# Patient Record
Sex: Male | Born: 1970
Health system: Southern US, Community
[De-identification: ages and names within clinical notes are randomized; demographics above are authoritative.]

## PROBLEM LIST (undated history)

## (undated) DIAGNOSIS — I499 Cardiac arrhythmia, unspecified: Secondary | ICD-10-CM

## (undated) DIAGNOSIS — M3320 Polymyositis, organ involvement unspecified: Secondary | ICD-10-CM

## (undated) DIAGNOSIS — I82409 Acute embolism and thrombosis of unspecified deep veins of unspecified lower extremity: Secondary | ICD-10-CM

## (undated) DIAGNOSIS — R9431 Abnormal electrocardiogram [ECG] [EKG]: Secondary | ICD-10-CM

## (undated) DIAGNOSIS — G7249 Other inflammatory and immune myopathies, not elsewhere classified: Secondary | ICD-10-CM

## (undated) DIAGNOSIS — I2699 Other pulmonary embolism without acute cor pulmonale: Secondary | ICD-10-CM

## (undated) DIAGNOSIS — S82892A Other fracture of left lower leg, initial encounter for closed fracture: Secondary | ICD-10-CM

## (undated) DIAGNOSIS — I739 Peripheral vascular disease, unspecified: Secondary | ICD-10-CM

## (undated) HISTORY — DX: Acute embolism and thrombosis of unspecified deep veins of unspecified lower extremity: I82.409

## (undated) HISTORY — DX: Polymyositis, organ involvement unspecified: M33.20

## (undated) HISTORY — DX: Other inflammatory and immune myopathies, not elsewhere classified: G72.49

## (undated) HISTORY — DX: Abnormal electrocardiogram (ECG) (EKG): R94.31

## (undated) HISTORY — PX: COLONOSCOPY: SHX174

## (undated) HISTORY — PX: NO PAST SURGERIES: SHX2092

## (undated) HISTORY — PX: OTHER SURGICAL HISTORY: SHX169

---

## 2010-04-21 ENCOUNTER — Emergency Department (HOSPITAL_COMMUNITY): Admission: EM | Admit: 2010-04-21 | Discharge: 2010-04-21 | Payer: Self-pay | Admitting: Emergency Medicine

## 2011-10-09 DIAGNOSIS — I739 Peripheral vascular disease, unspecified: Secondary | ICD-10-CM

## 2011-10-09 HISTORY — DX: Peripheral vascular disease, unspecified: I73.9

## 2012-03-19 ENCOUNTER — Encounter (HOSPITAL_COMMUNITY): Payer: Self-pay | Admitting: Emergency Medicine

## 2012-03-19 ENCOUNTER — Emergency Department (HOSPITAL_COMMUNITY)
Admission: EM | Admit: 2012-03-19 | Discharge: 2012-03-20 | Disposition: A | Discharge status: Home / Self Care | Payer: Self-pay | Attending: Emergency Medicine | Admitting: Emergency Medicine

## 2012-03-19 DIAGNOSIS — Y92009 Unspecified place in unspecified non-institutional (private) residence as the place of occurrence of the external cause: Secondary | ICD-10-CM | POA: Insufficient documentation

## 2012-03-19 DIAGNOSIS — M25579 Pain in unspecified ankle and joints of unspecified foot: Secondary | ICD-10-CM | POA: Insufficient documentation

## 2012-03-19 DIAGNOSIS — S93402A Sprain of unspecified ligament of left ankle, initial encounter: Secondary | ICD-10-CM

## 2012-03-19 DIAGNOSIS — W1809XA Striking against other object with subsequent fall, initial encounter: Secondary | ICD-10-CM | POA: Insufficient documentation

## 2012-03-19 DIAGNOSIS — S93409A Sprain of unspecified ligament of unspecified ankle, initial encounter: Secondary | ICD-10-CM | POA: Insufficient documentation

## 2012-03-19 NOTE — ED Notes (Addendum)
C/o L ankle pain since stepping off porch into hole around 11:30pm tonight.  C/o nausea and diarrhea since ankle injury.  Pt needed to go to restroom on arrival and then x-ray took him.  Will get vitals when he returns.

## 2012-03-20 ENCOUNTER — Emergency Department (HOSPITAL_COMMUNITY): Payer: Self-pay

## 2012-03-20 IMAGING — CR DG ANKLE COMPLETE 3+V*L*
3 series · 3 of 3 positions shown · non-contrast
Comparison: None.

CLINICAL DATA: Left ankle pain status post twisting injury.

LEFT ANKLE COMPLETE - 3+ VIEW

[x ankle ap left]
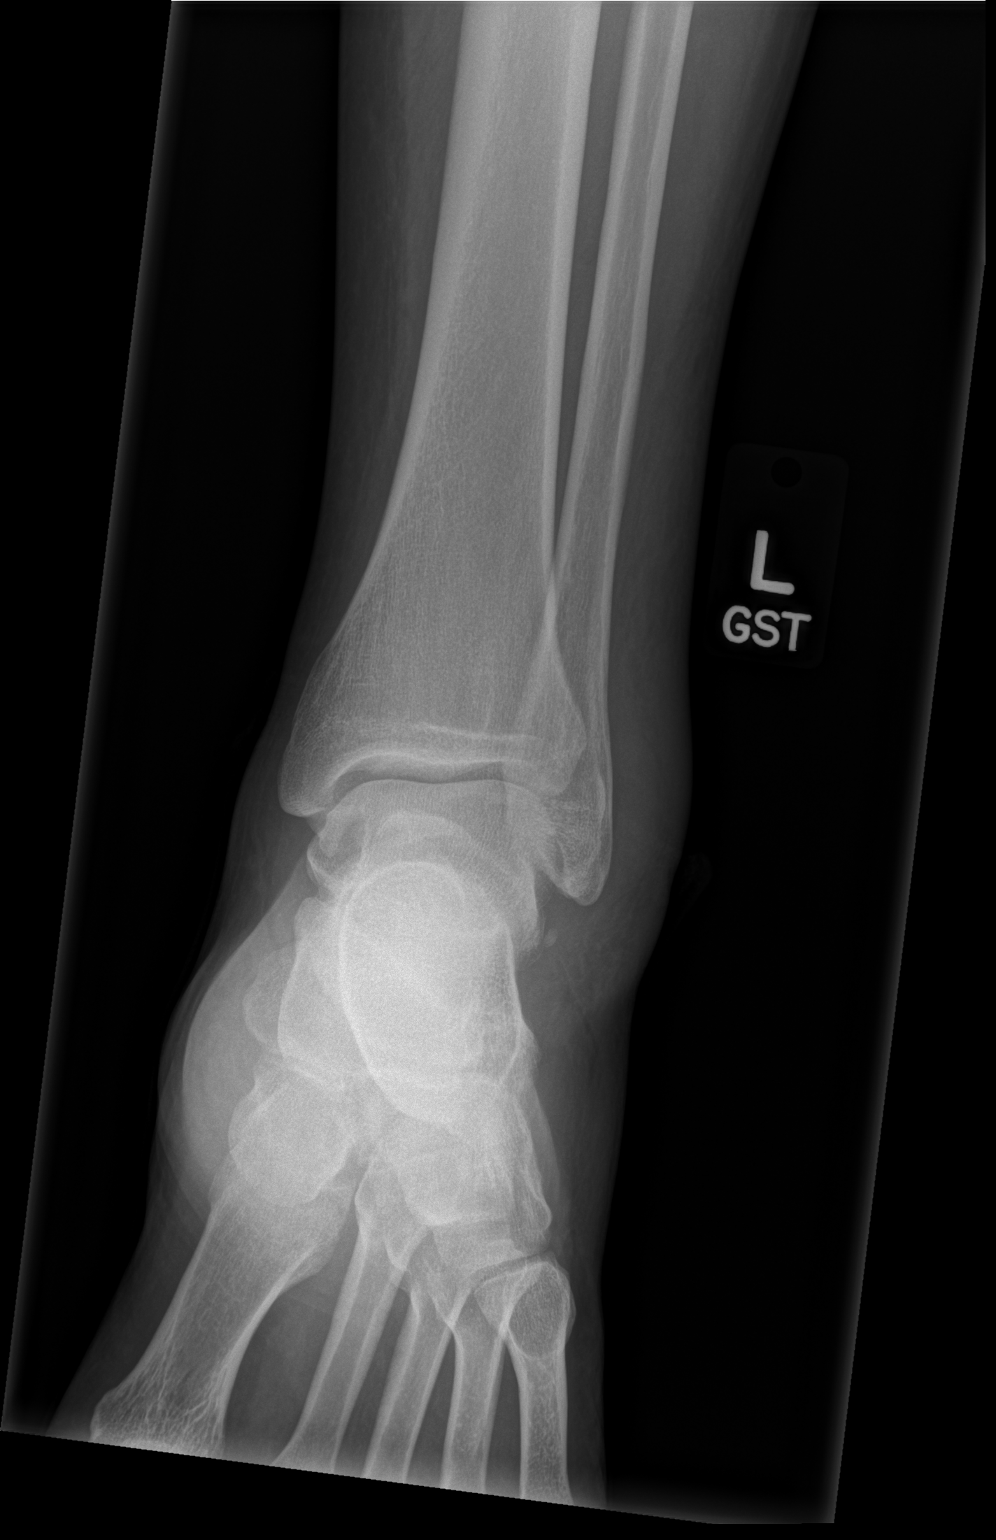

[x ankle obl left]
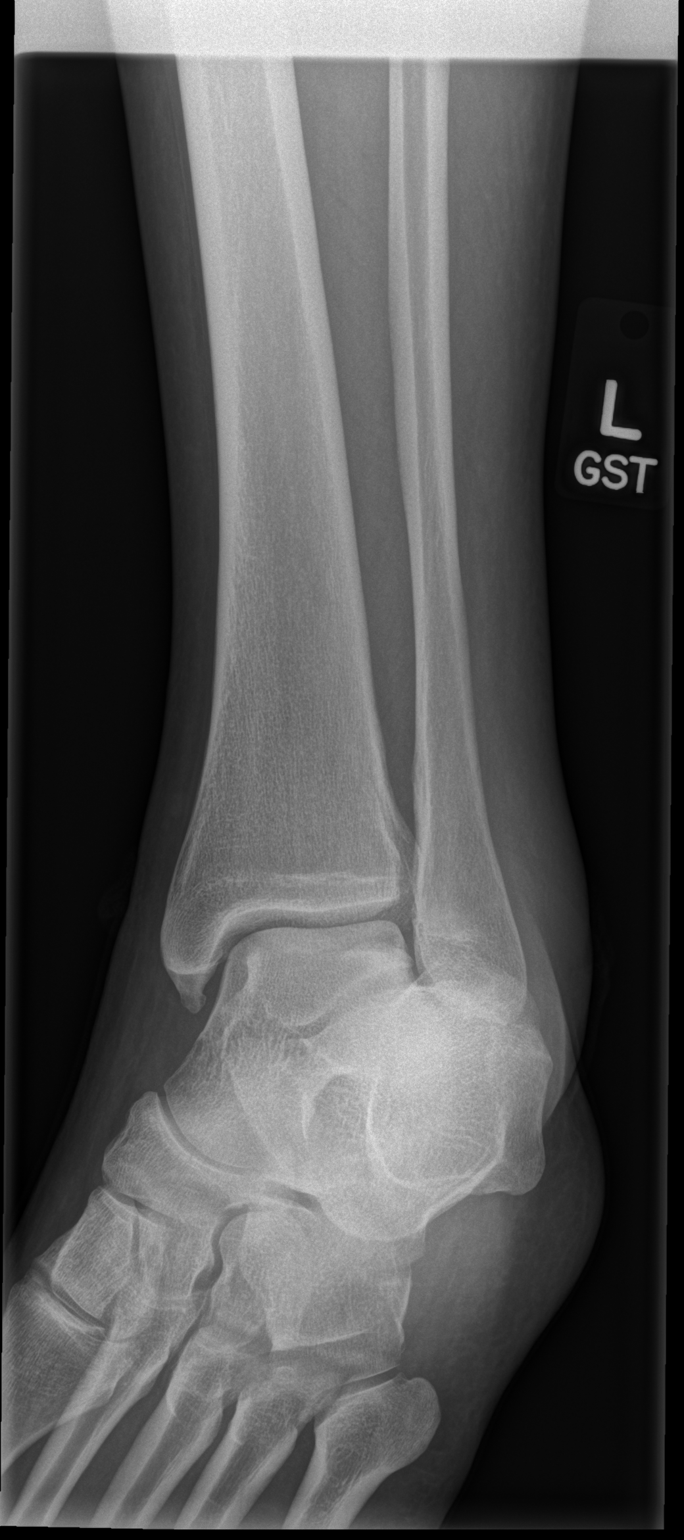

[x ankle lat left]
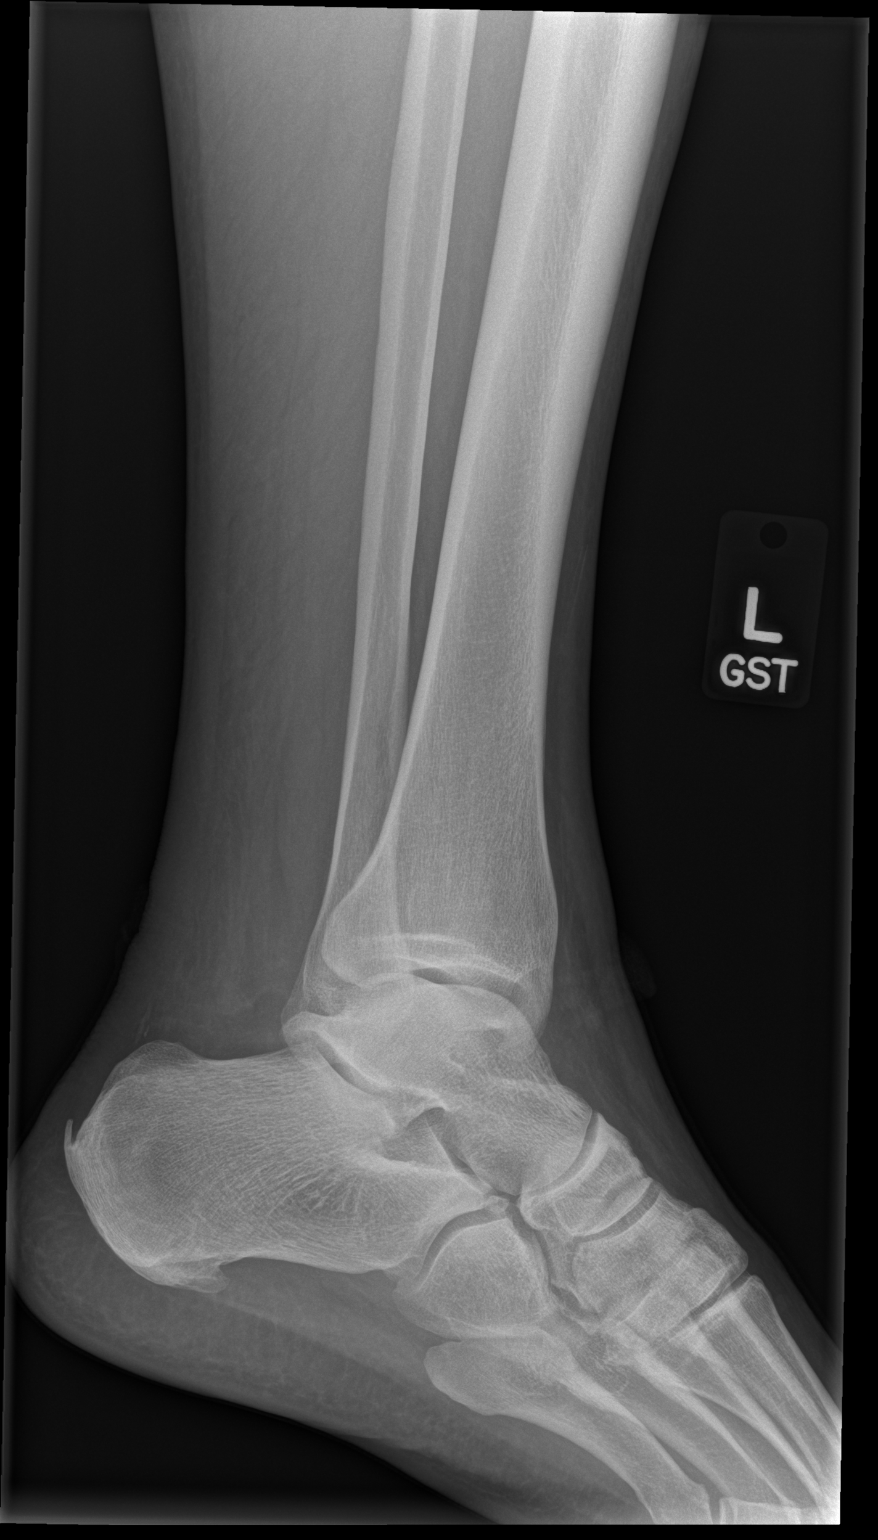

[3 of 3 positions shown; findings below may reference images not displayed]

FINDINGS: Soft tissue swelling overlies the lateral malleolus.
Degenerative changes about the medial malleolus.  Plantar and
posterior calcaneal enthesopathic changes.  No acute fracture or
dislocation identified.  Ankle mortise intact.
IMPRESSION: Lateral soft tissue swelling without acute osseous abnormality
identified. If clinical concern for a fracture persists, recommend
a repeat radiograph in 5-10 days to evaluate for interval change or
callus formation.

## 2012-03-20 MED ORDER — HYDROCODONE-ACETAMINOPHEN 5-325 MG PO TABS
1.0000 | ORAL_TABLET | Freq: Once | ORAL | Status: AC
  Administered 2012-03-20: 1 via ORAL
  Filled 2012-03-20: qty 1

## 2012-03-20 MED ORDER — HYDROCODONE-ACETAMINOPHEN 5-325 MG PO TABS: 1.0000 | ORAL_TABLET | ORAL | Status: AC | PRN

## 2012-03-20 NOTE — Discharge Instructions (Signed)
Ankle Sprain An ankle sprain is an injury to the strong, fibrous tissues (ligaments) that hold the bones of your ankle joint together.  CAUSES Ankle sprain usually is caused by a fall or by twisting your ankle. People who participate in sports are more prone to these types of injuries.  SYMPTOMS  Symptoms of ankle sprain include:  Pain in your ankle. The pain may be present at rest or only when you are trying to stand or walk.   Swelling.   Bruising. Bruising may develop immediately or within 1 to 2 days after your injury.   Difficulty standing or walking.  DIAGNOSIS  Your caregiver will ask you details about your injury and perform a physical exam of your ankle to determine if you have an ankle sprain. During the physical exam, your caregiver will press and squeeze specific areas of your foot and ankle. Your caregiver will try to move your ankle in certain ways. An X-ray exam may be done to be sure a bone was not broken or a ligament did not separate from one of the bones in your ankle (avulsion).  TREATMENT  Certain types of braces can help stabilize your ankle. Your caregiver can make a recommendation for this. Your caregiver may recommend the use of medication for pain. If your sprain is severe, your caregiver may refer you to a surgeon who helps to restore function to parts of your skeletal system (orthopedist) or a physical therapist. HOME CARE INSTRUCTIONS  Apply ice to your injury for 1 to 2 days or as directed by your caregiver. Applying ice helps to reduce inflammation and pain.  Put ice in a plastic bag.   Place a towel between your skin and the bag.   Leave the ice on for 15 to 20 minutes at a time, every 2 hours while you are awake.   Take over-the-counter or prescription medicines for pain, discomfort, or fever only as directed by your caregiver.   Keep your injured leg elevated, when possible, to lessen swelling.   If your caregiver recommends crutches, use them as  instructed. Gradually, put weight on the affected ankle. Continue to use crutches or a cane until you can walk without feeling pain in your ankle.   If you have a plaster splint, wear the splint as directed by your caregiver. Do not rest it on anything harder than a pillow the first 24 hours. Do not put weight on it. Do not get it wet. You may take it off to take a shower or bath.   You may have been given an elastic bandage to wear around your ankle to provide support. If the elastic bandage is too tight (you have numbness or tingling in your foot or your foot becomes cold and blue), adjust the bandage to make it comfortable.   If you have an air splint, you may blow more air into it or let air out to make it more comfortable. You may take your splint off at night and before taking a shower or bath.   Wiggle your toes in the splint several times per day if you are able.  SEEK MEDICAL CARE IF:   You have an increase in bruising, swelling, or pain.   Your toes feel cold.   Pain relief is not achieved with medication.  SEEK IMMEDIATE MEDICAL CARE IF: Your toes are numb or blue or you have severe pain. MAKE SURE YOU:   Understand these instructions.   Will watch your condition.     Will get help right away if you are not doing well or get worse.  Document Released: 09/24/2005 Document Revised: 09/13/2011 Document Reviewed: 04/28/2008 [facility] Patient Information 2012 [place], [place].Sprains Sprains are painful injuries to joints as a result of partial or complete tearing of ligaments. HOME CARE INSTRUCTIONS   For the first 24 hours, keep the injured limb raised on 2 pillows while lying down.   Apply ice bags about every 2 hours for 20 to 30 minutes, while awake, to the injured area for the first 24 hours. Then apply as directed by your caregiver. Place the ice in a plastic bag with a towel around it to prevent frostbite to the skin.   Only take over-the-counter or prescription medicines  for pain, discomfort, or fever as directed by your caregiver.   If an ace bandage (a stretchy, elastic wrapping bandage) has been applied today, remove and reapply every 3 to 4 hours. Apply firm enough to keep swelling down. Donot apply tightly. Watch fingers or toes for swelling, bluish discoloration, coldness, numbness, or excessive pain. If any of these problems (symptoms) occur, remove the ace bandage and reapply it more loosely. Contact your caregiver or return to this location if these symptoms persist.  Persistent pain and inability to use the injured area for more than 2 to 3 days are warning signs. See a caregiver for a follow-up visit as soon as possible. A hairline fracture (broken bone) may not show on X-rays. Persistent pain and swelling indicate that further evaluation, use of crutches, and/or more X-rays are needed. X-rays may sometimes not show a small fracture until a week or ten days later. Make a follow-up appointment with your own caregiver or to whom we have referred you. A specialist in reading X-rays(radiologist) will re-read your X-rays. Make sure you know how to obtain your X-ray results. Do not assume everything is normal if you do not hear from [place]. SEEK IMMEDIATE MEDICAL CARE IF:  You develop severe pain or more swelling.   The pain is not controlled with medicine.   Your skin or nails below the injury turn blue or grey or feel cold or numb.  Document Released: 09/21/2000 Document Revised: 09/13/2011 Document Reviewed: 05/10/2008 [facility] Patient Information 2012 [place], [place].

## 2012-03-20 NOTE — ED Provider Notes (Signed)
Medical screening examination/treatment/procedure(s) were performed by non-physician practitioner and as supervising physician I was immediately available for consultation/collaboration.  Jasmine Awe, MD 03/20/12 0145

## 2012-03-20 NOTE — ED Notes (Signed)
Pt. To x-ray .

## 2012-03-20 NOTE — ED Provider Notes (Signed)
History     CSN: [id number]  Arrival date & time 03/19/12  2347   First MD Initiated Contact with Patient 03/20/12 0010      Chief Complaint  Patient presents with  . Ankle Pain    (Consider location/radiation/quality/duration/timing/severity/associated sxs/prior treatment) HPI Comments: Patient here with left ankle pain after stepping off a step and into a hole - he states that he inverted his ankle in this - reports continued swelling and pain, is able to ambulate, denies numbness, tingling, instability in the ankle.  Patient is a 41 y.o. male presenting with ankle pain. The history is provided by the patient. No language interpreter was used.  Ankle Pain  The incident occurred 3 to 5 hours ago. The incident occurred at home. The injury mechanism was a fall. The pain is present in the left ankle. The quality of the pain is described as throbbing. The pain is at a severity of 8/10. The pain is moderate. The pain has been constant since onset. Pertinent negatives include no numbness, no inability to bear weight, no loss of motion, no muscle weakness, no loss of sensation and no tingling. He reports no foreign bodies present. The symptoms are aggravated by activity and bearing weight. He has tried nothing for the symptoms. The treatment provided no relief.    History reviewed. No pertinent past medical history.  History reviewed. No pertinent past surgical history.  No family history on file.  History  Substance Use Topics  . Smoking status: Never Smoker   . Smokeless tobacco: Not on file  . Alcohol Use: No      Review of Systems  Constitutional: Negative for fever and chills.  HENT: Negative for neck pain.   Eyes: Negative for pain.  Respiratory: Negative for chest tightness and shortness of breath.   Cardiovascular: Negative for chest pain.  Genitourinary: Negative for flank pain.  Musculoskeletal: Positive for joint swelling and arthralgias.  Neurological: Negative for  tingling and numbness.  All other systems reviewed and are negative.    Allergies  Review of patient's allergies indicates no known allergies.  Home Medications  No current outpatient prescriptions on file.  BP 113/74  Pulse 96  Temp 98.1 F (36.7 C) (Oral)  Resp 16  SpO2 95%  Physical Exam  Nursing note and vitals reviewed. Constitutional: He is oriented to person, place, and time. He appears well-developed and well-nourished. No distress.  HENT:  Head: Normocephalic and atraumatic.  Right Ear: External ear normal.  Left Ear: External ear normal.  Nose: Nose normal.  Mouth/Throat: Oropharynx is clear and moist. No oropharyngeal exudate.  Eyes: Conjunctivae are normal. Pupils are equal, round, and reactive to light. No scleral icterus.  Neck: Normal range of motion. Neck supple.  Cardiovascular: Normal rate, regular rhythm and normal heart sounds.  Exam reveals no gallop and no friction rub.   No murmur heard. Pulmonary/Chest: Effort normal and breath sounds normal. No respiratory distress. He has no wheezes. He has no rales. He exhibits no tenderness.  Abdominal: Soft. Bowel sounds are normal. He exhibits no distension. There is no tenderness.  Musculoskeletal:       Left ankle: He exhibits decreased range of motion and swelling. He exhibits no ecchymosis, no deformity and normal pulse. tenderness. Lateral malleolus tenderness found. Achilles tendon normal. Achilles tendon exhibits no pain and no defect.  Lymphadenopathy:    He has no cervical adenopathy.  Neurological: He is alert and oriented to person, place, and time. No cranial nerve  deficit. He exhibits normal muscle tone. Coordination normal.  Skin: Skin is warm and dry. No rash noted. No erythema. No pallor.  Psychiatric: He has a normal mood and affect. His behavior is normal. Judgment and thought content normal.    ED Course  Procedures (including critical care time)  Labs Reviewed - No data to display Dg  Ankle Complete Left  03/20/2012  *RADIOLOGY REPORT*  Clinical Data: Left ankle pain status post twisting injury.  LEFT ANKLE COMPLETE - 3+ VIEW  Comparison: None.  Findings: Soft tissue swelling overlies the lateral malleolus. Degenerative changes about the medial malleolus.  Plantar and posterior calcaneal enthesopathic changes.  No acute fracture or dislocation identified.  Ankle mortise intact.  IMPRESSION: Lateral soft tissue swelling without acute osseous abnormality identified. If clinical concern for a fracture persists, recommend a repeat radiograph in 5-10 days to evaluate for interval change or callus formation.  Original Report Authenticated By: Waneta Martins, M.D.     Left ankle sprain   MDM  Patient here with pain and swelling to lateral malleolus after left foot inversion.  Likely sprain, will place in ankle ASO and crutches and he will follow up with ortho in 1 week if continues with pain and swelling.       Izola Price [place], [place] 03/20/12 0144

## 2012-03-20 NOTE — ED Notes (Signed)
Pt. Discharged to home via w/c, pt. Alert and oriented, NAD noted 

## 2012-05-08 DIAGNOSIS — I2699 Other pulmonary embolism without acute cor pulmonale: Secondary | ICD-10-CM

## 2012-05-08 HISTORY — DX: Other pulmonary embolism without acute cor pulmonale: I26.99

## 2012-05-29 DIAGNOSIS — I2699 Other pulmonary embolism without acute cor pulmonale: Secondary | ICD-10-CM

## 2012-05-30 DIAGNOSIS — R0602 Shortness of breath: Secondary | ICD-10-CM

## 2013-02-13 ENCOUNTER — Emergency Department (HOSPITAL_COMMUNITY): Payer: Self-pay

## 2013-02-13 ENCOUNTER — Encounter (HOSPITAL_COMMUNITY): Payer: Self-pay | Admitting: Emergency Medicine

## 2013-02-13 ENCOUNTER — Emergency Department (HOSPITAL_COMMUNITY)
Admission: EM | Admit: 2013-02-13 | Discharge: 2013-02-13 | Payer: Self-pay | Attending: Emergency Medicine | Admitting: Emergency Medicine

## 2013-02-13 DIAGNOSIS — Y9389 Activity, other specified: Secondary | ICD-10-CM | POA: Insufficient documentation

## 2013-02-13 DIAGNOSIS — S93609A Unspecified sprain of unspecified foot, initial encounter: Secondary | ICD-10-CM | POA: Insufficient documentation

## 2013-02-13 DIAGNOSIS — S93601A Unspecified sprain of right foot, initial encounter: Secondary | ICD-10-CM

## 2013-02-13 DIAGNOSIS — W19XXXA Unspecified fall, initial encounter: Secondary | ICD-10-CM | POA: Insufficient documentation

## 2013-02-13 DIAGNOSIS — S92002A Unspecified fracture of left calcaneus, initial encounter for closed fracture: Secondary | ICD-10-CM

## 2013-02-13 DIAGNOSIS — Y9229 Other specified public building as the place of occurrence of the external cause: Secondary | ICD-10-CM | POA: Insufficient documentation

## 2013-02-13 DIAGNOSIS — Z8739 Personal history of other diseases of the musculoskeletal system and connective tissue: Secondary | ICD-10-CM | POA: Insufficient documentation

## 2013-02-13 DIAGNOSIS — S92009A Unspecified fracture of unspecified calcaneus, initial encounter for closed fracture: Secondary | ICD-10-CM | POA: Insufficient documentation

## 2013-02-13 DIAGNOSIS — Z8709 Personal history of other diseases of the respiratory system: Secondary | ICD-10-CM | POA: Insufficient documentation

## 2013-02-13 HISTORY — DX: Other pulmonary embolism without acute cor pulmonale: I26.99

## 2013-02-13 HISTORY — DX: Other fracture of left lower leg, initial encounter for closed fracture: S82.892A

## 2013-02-13 IMAGING — CR DG FOOT COMPLETE 3+V*R*
3 series · 3 of 3 positions shown · non-contrast
Comparison: Concurrently obtained radiographs of the contralateral
foot

CLINICAL DATA: Fall, pain

RIGHT FOOT COMPLETE - 3+ VIEW

[x foot ap right]
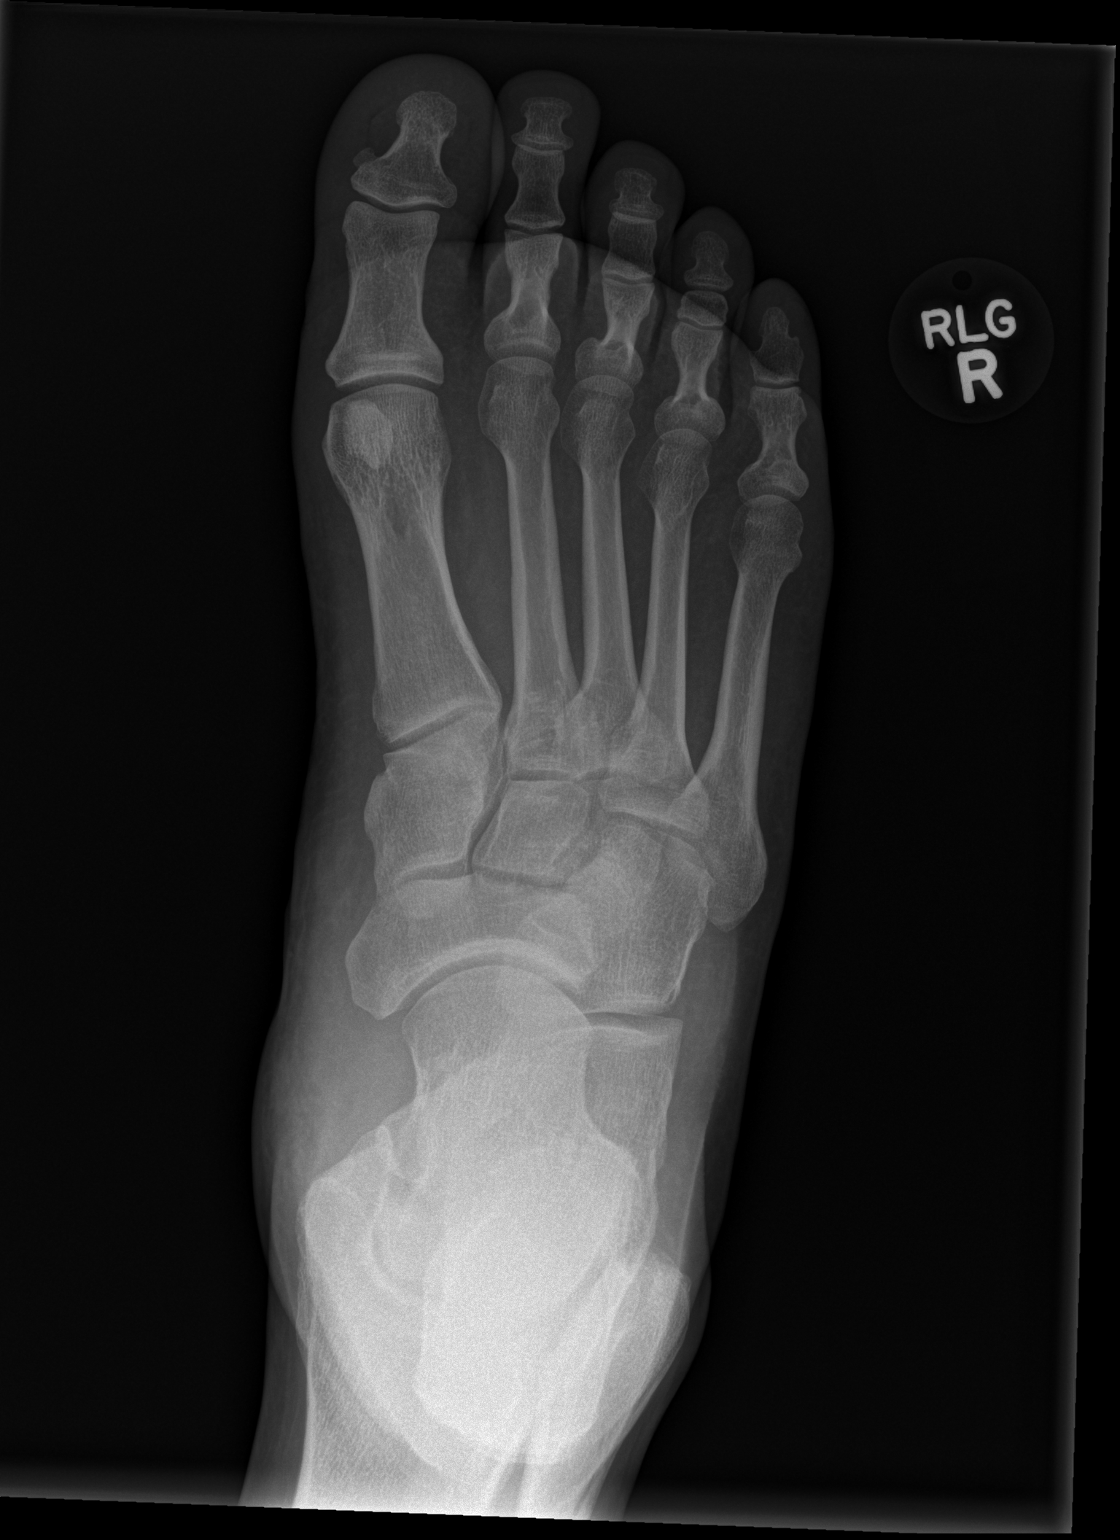

[x foot obl right]
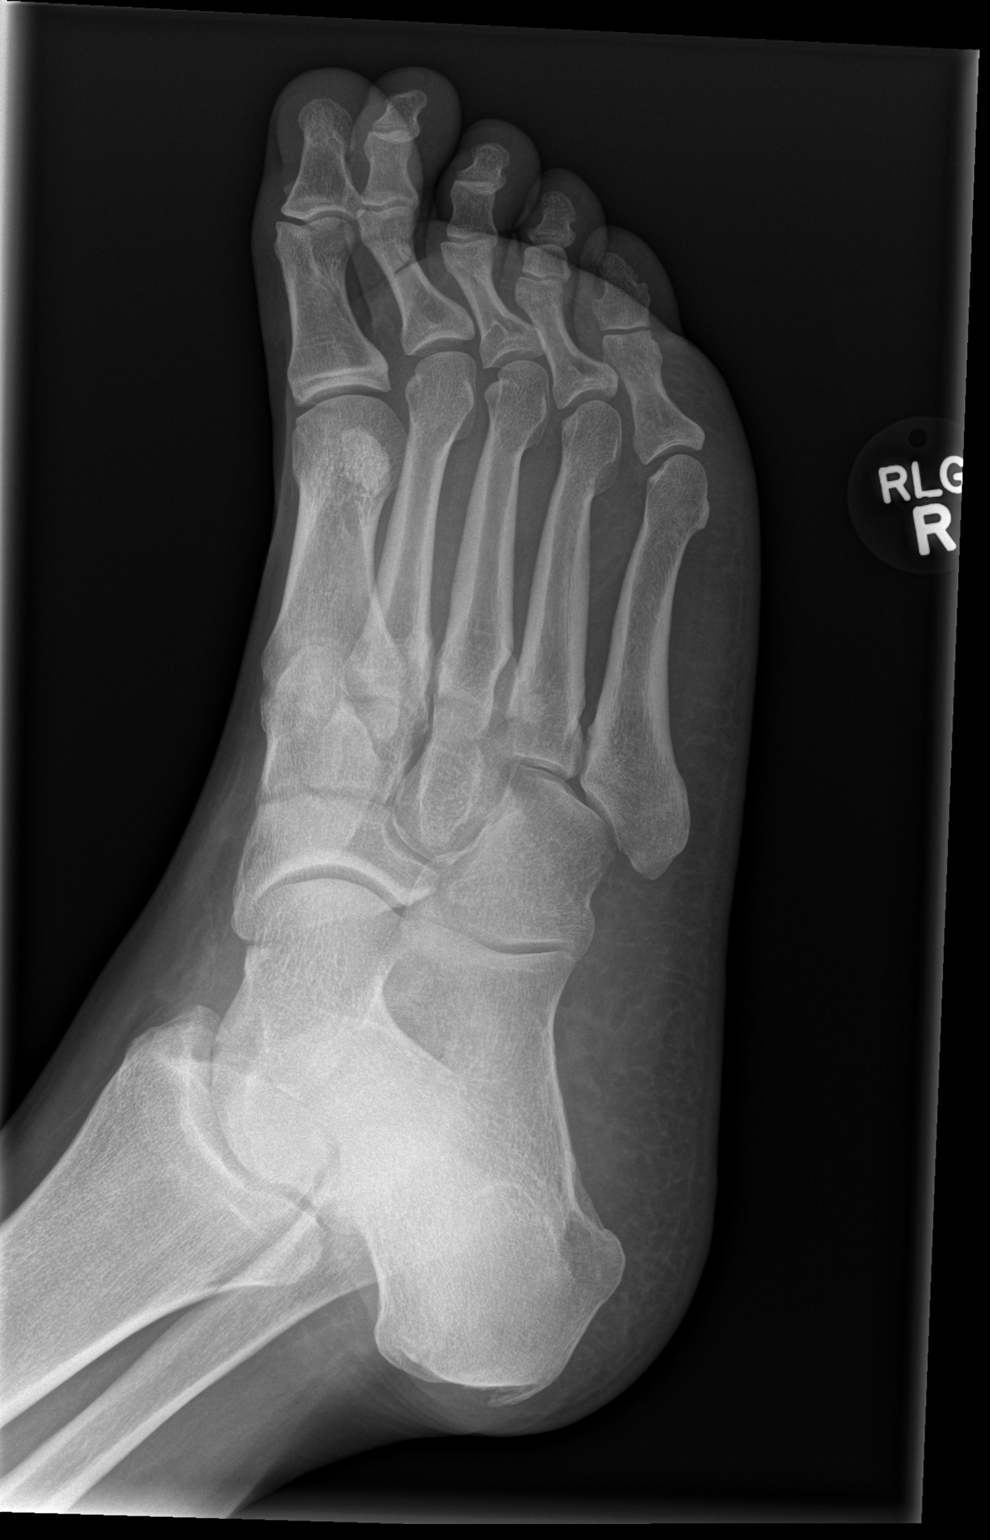

[x foot lat right]
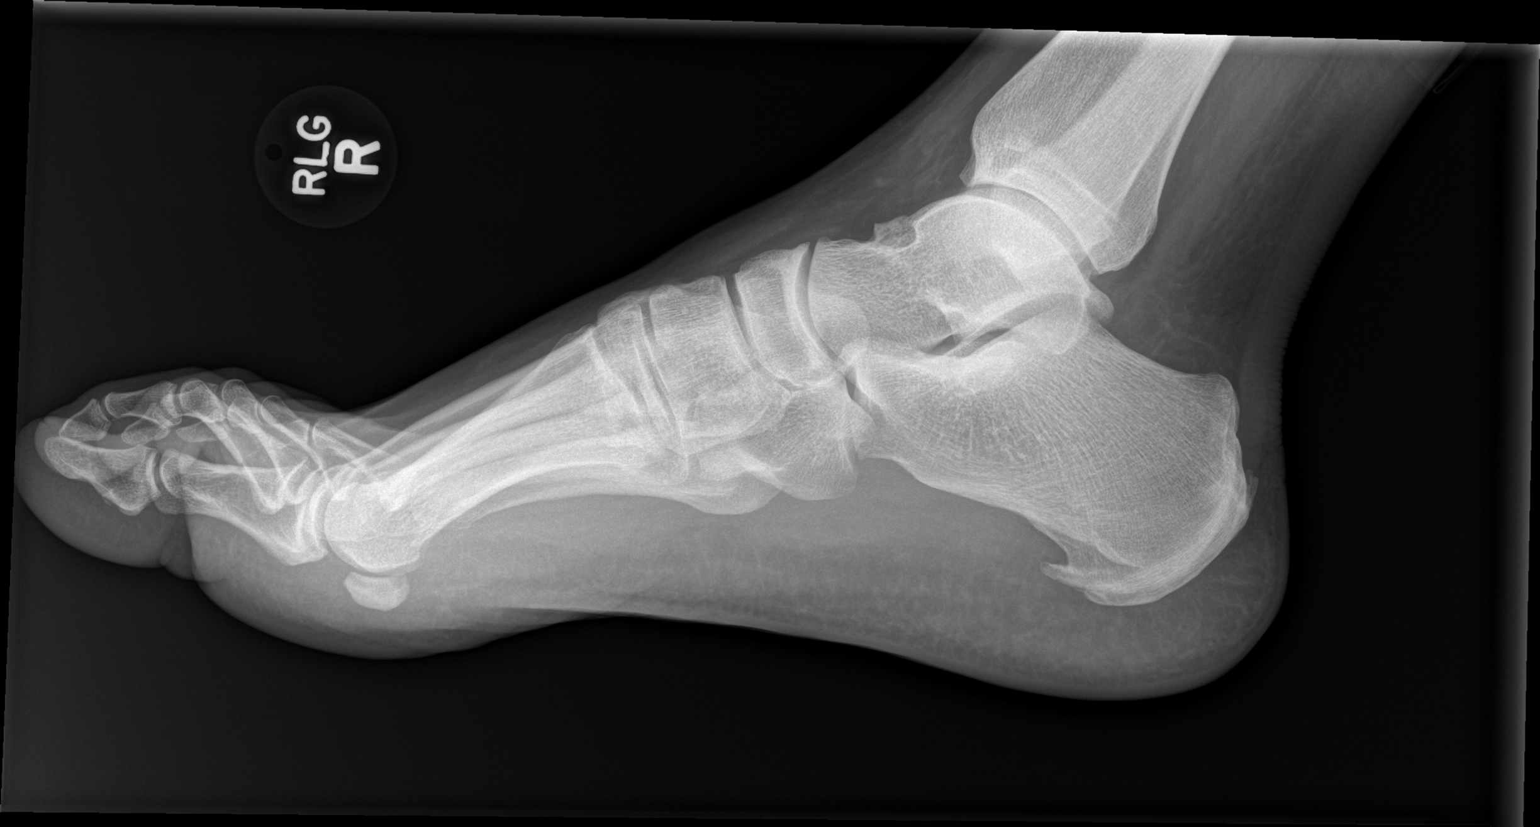

[3 of 3 positions shown; findings below may reference images not displayed]

FINDINGS: Query mild irregularity of the medial aspect of the talar
neck.  A nondisplaced fracture is not excluded.  There is mild
associated soft tissue swelling.  Otherwise, no acute fracture or
malalignment.  There is no ankle joint effusion.
IMPRESSION: Mild cortical irregularity in the medial aspect of the talar neck
with some associated soft tissue swelling.  A nondisplaced fracture
is not excluded.

Consider further evaluation with CT scan.

## 2013-02-13 IMAGING — CR DG FOOT COMPLETE 3+V*L*
3 series · 3 of 3 positions shown · non-contrast
Comparison: None.

CLINICAL DATA: Recent traumatic injury with bilateral foot pain

LEFT FOOT - COMPLETE 3+ VIEW

[x foot ap left]
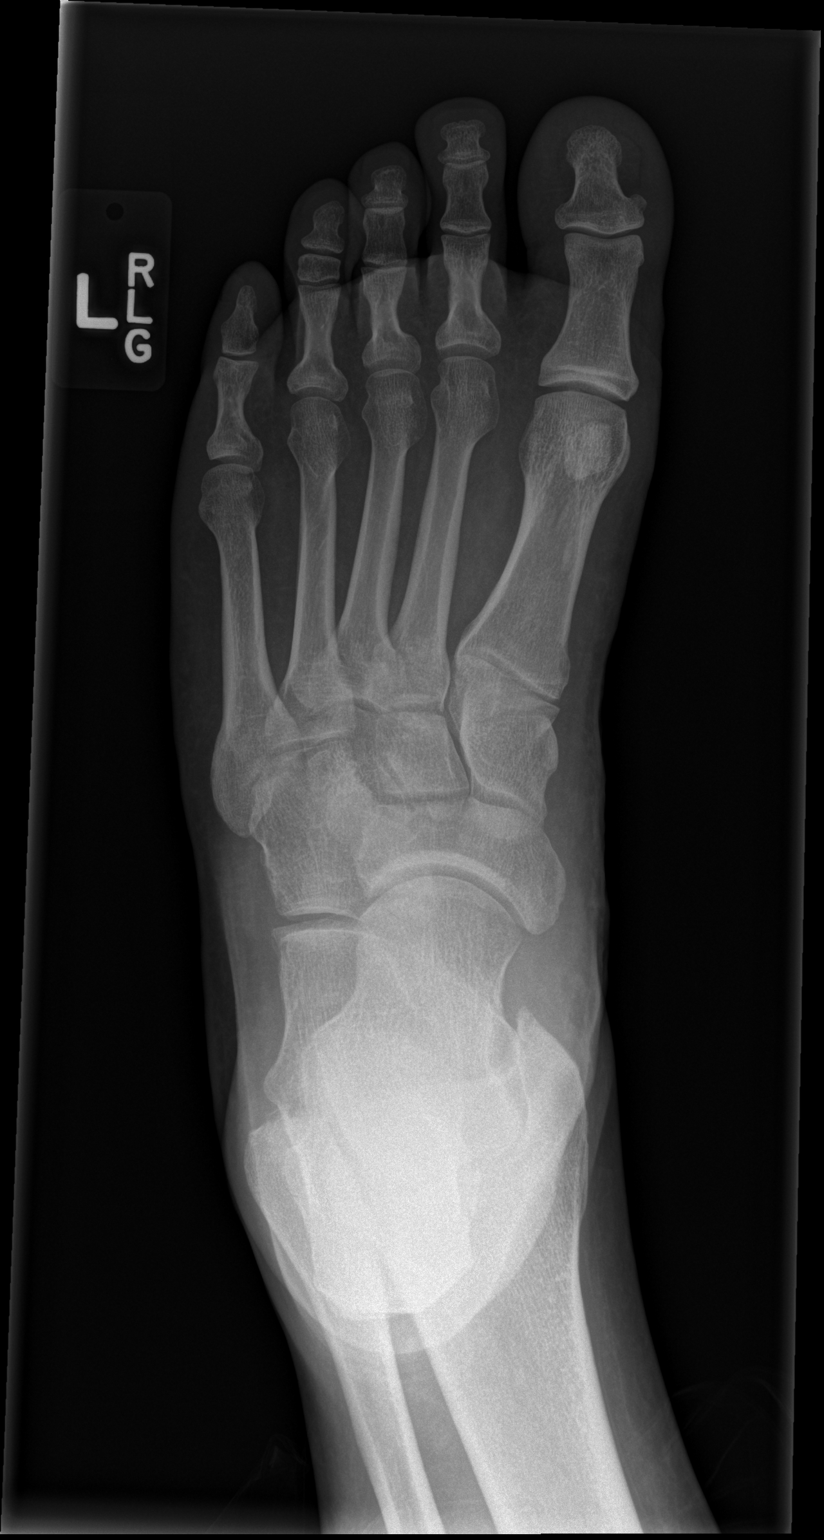

[x foot obl left]
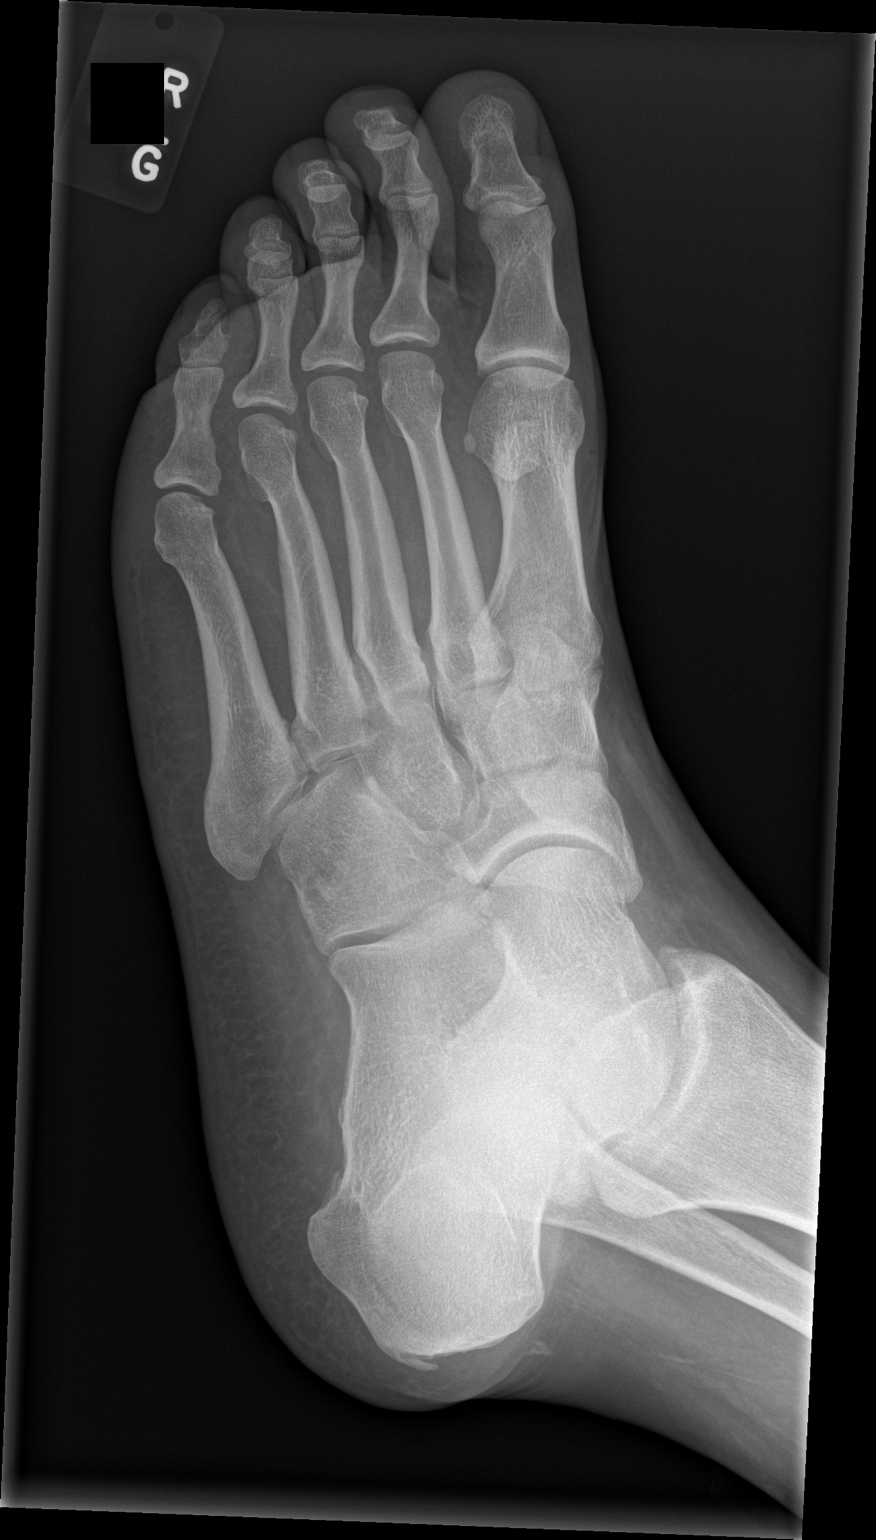

[x foot lat left]
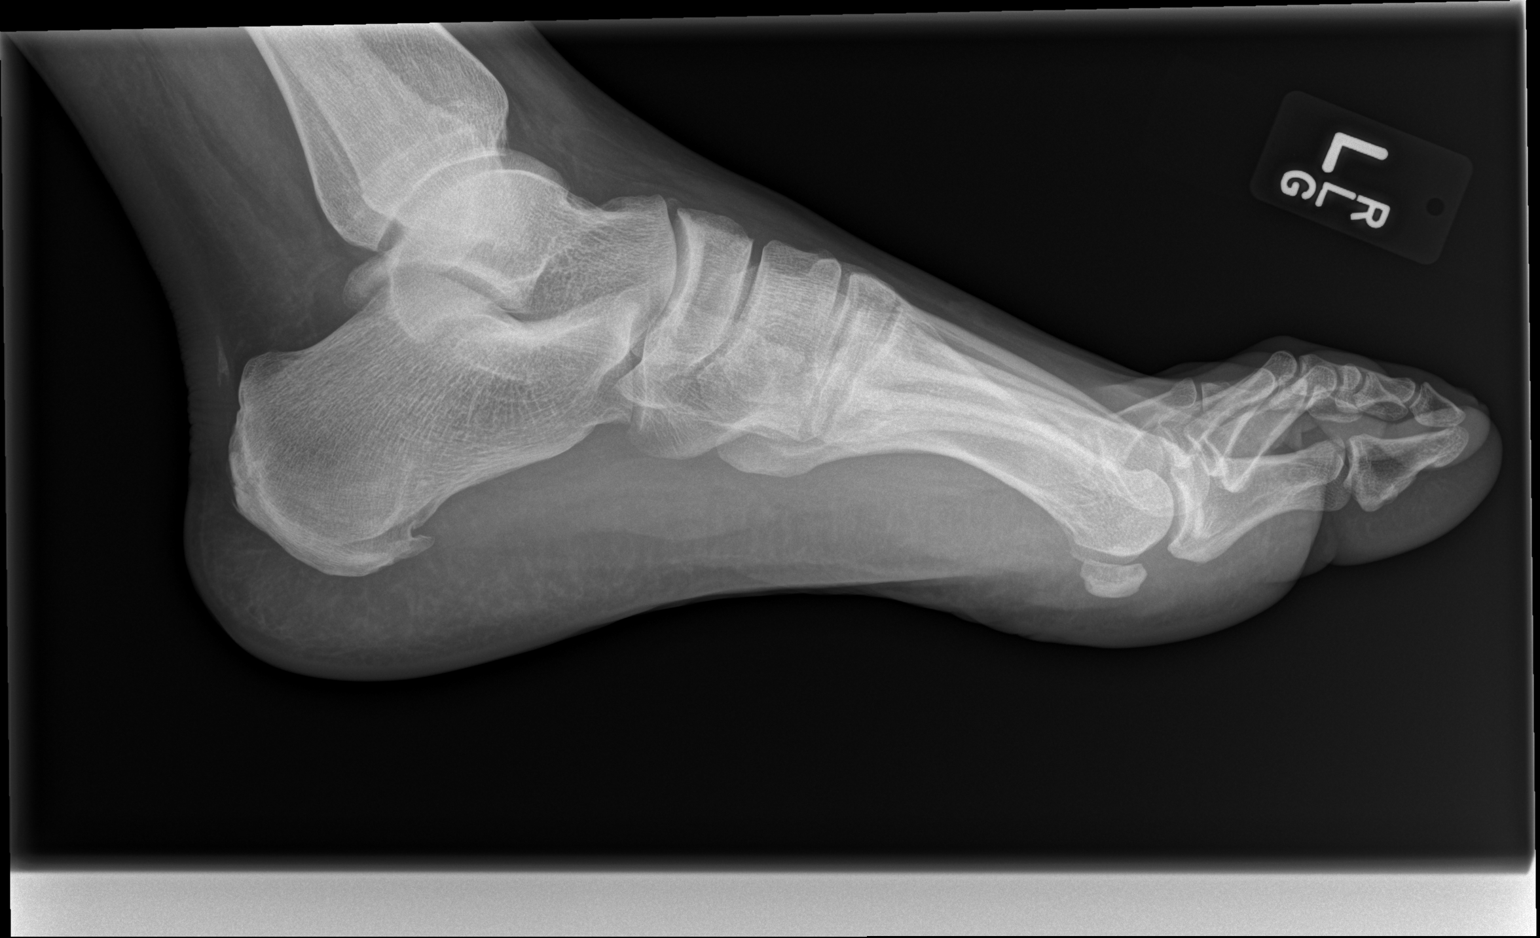

[3 of 3 positions shown; findings below may reference images not displayed]

FINDINGS: A small plantar spur is noted.  No acute fracture or
dislocation is seen.  No gross soft tissue abnormality is noted.
IMPRESSION: Mild degenerative change without acute abnormality.

## 2013-02-13 IMAGING — CR DG ANKLE COMPLETE 3+V*R*
3 series · 3 of 3 positions shown · non-contrast
Comparison: None.

CLINICAL DATA: Recent injury with ankle pain

RIGHT ANKLE - COMPLETE 3+ VIEW

[x ankle ap right]
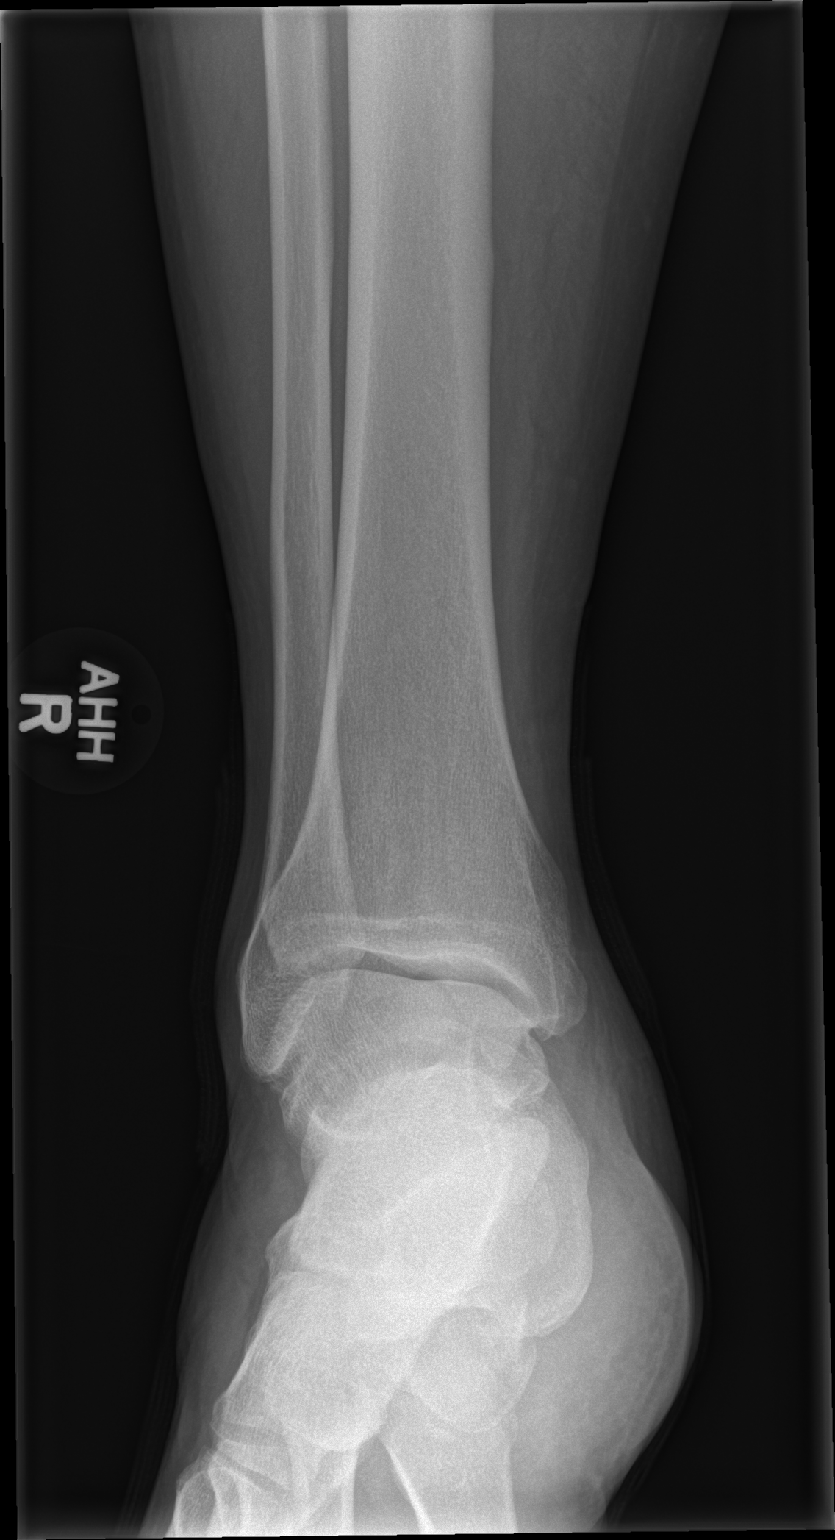

[x ankle obl right]
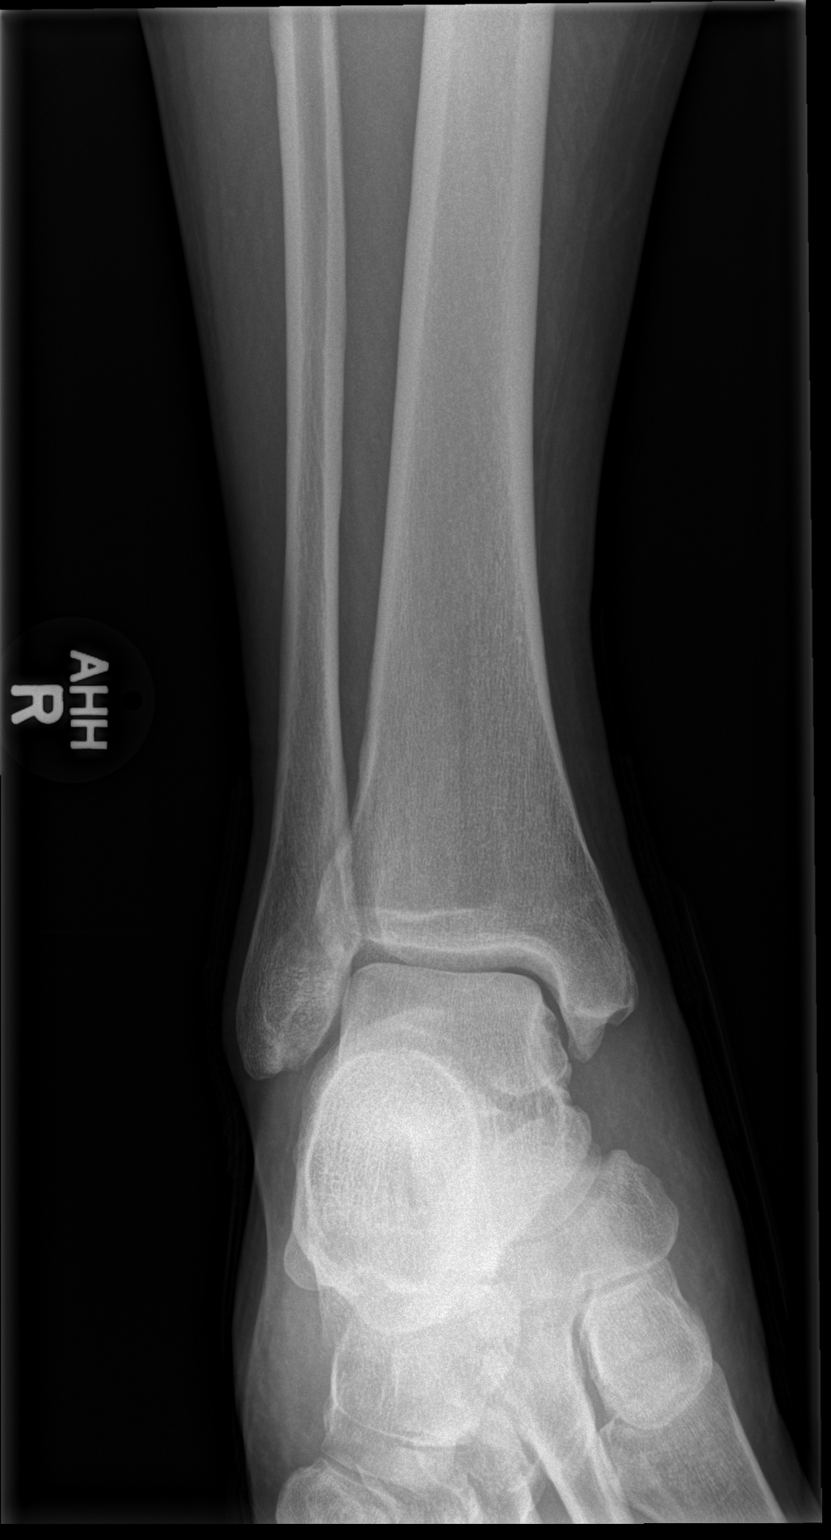

[x ankle lat right]
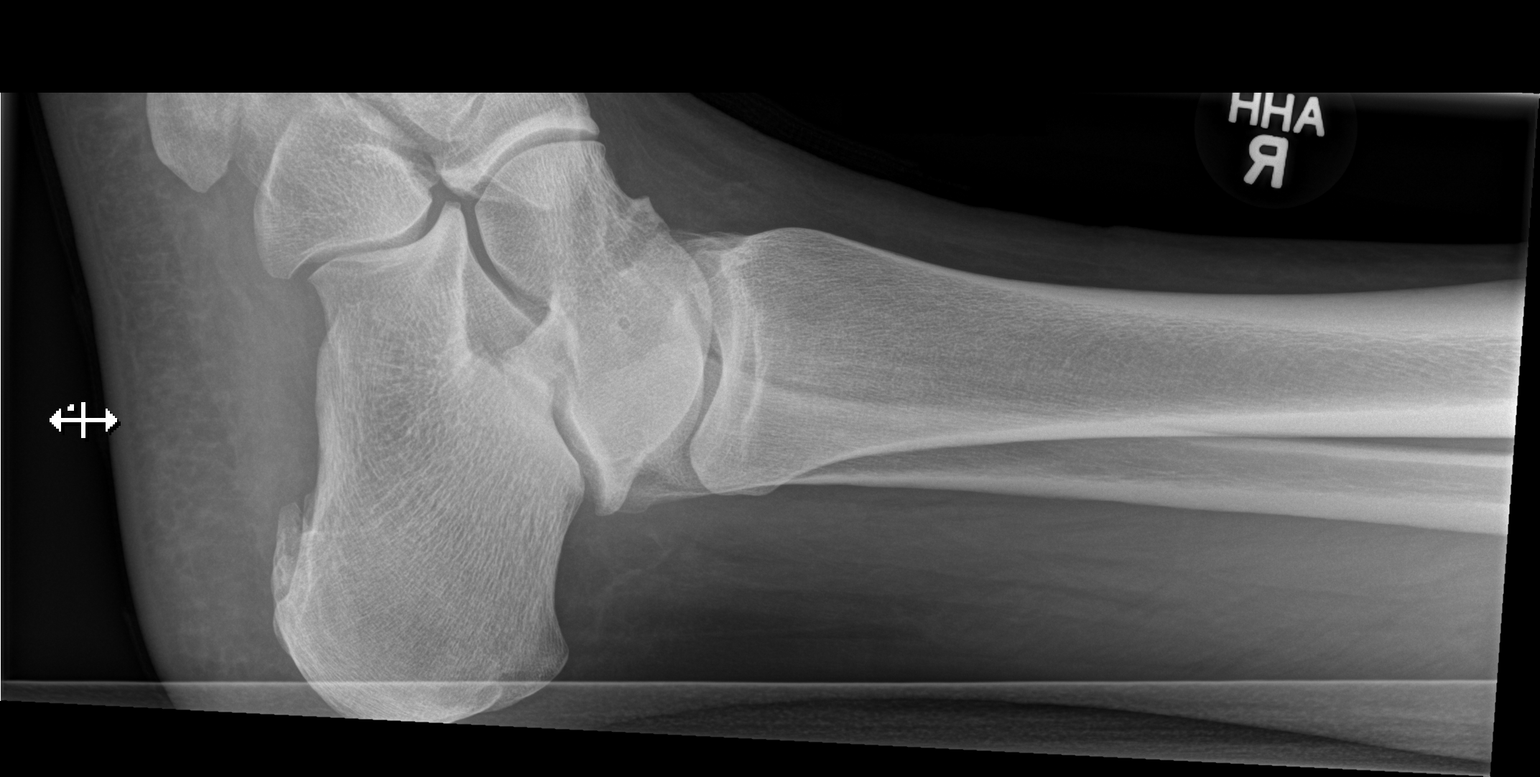

[3 of 3 positions shown; findings below may reference images not displayed]

FINDINGS: No acute fracture or dislocation is noted. Mild soft
tissue swelling is noted medially.
IMPRESSION: Mild soft tissue changes without acute bony abnormality.

## 2013-02-13 IMAGING — CT CT FOOT*R* W/O CM
3 of 4 series · 15 of 27 positions shown, 17 images · non-contrast
Comparison: Radiographs dated [DATE]

CLINICAL DATA: Foot and ankle pain and swelling after jumping from
a wall.

CT OF THE RIGHT FOOT WITHOUT CONTRAST
TECHNIQUE: Multidetector CT imaging was performed according to the
standard protocol. Multiplanar CT image reconstructions were also
generated.

[Series 10: rt. foot st · axial · 0.52mm/px · z∈[-673,-493]mm · 5 of 136 slices shown, 7 images]
[im 23/136  soft-tissue]
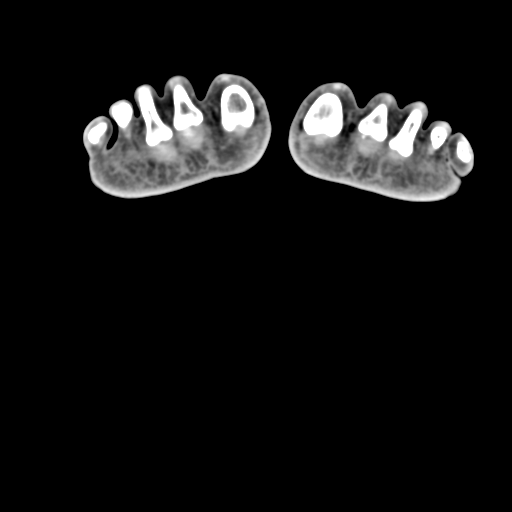
[im 23/136  bone]
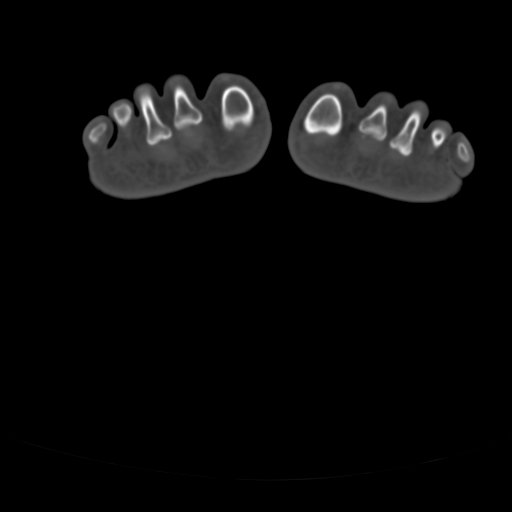
[im 46/136  bone]
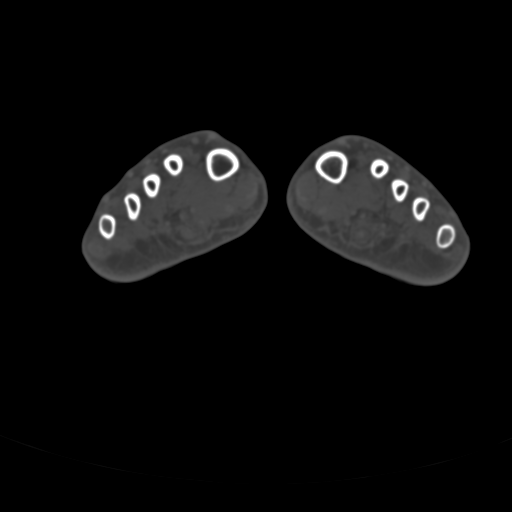
[im 68/136  bone]
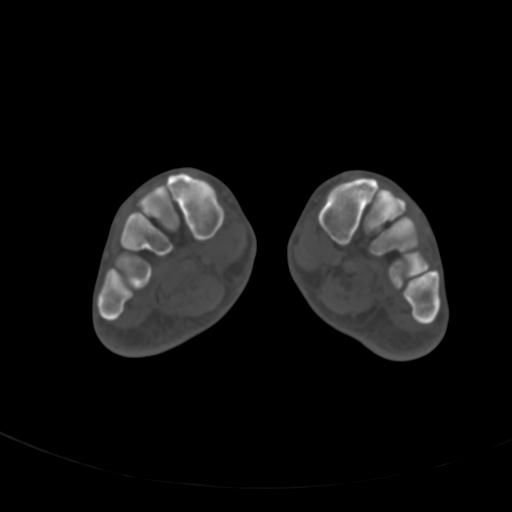
[im 91/136  bone]
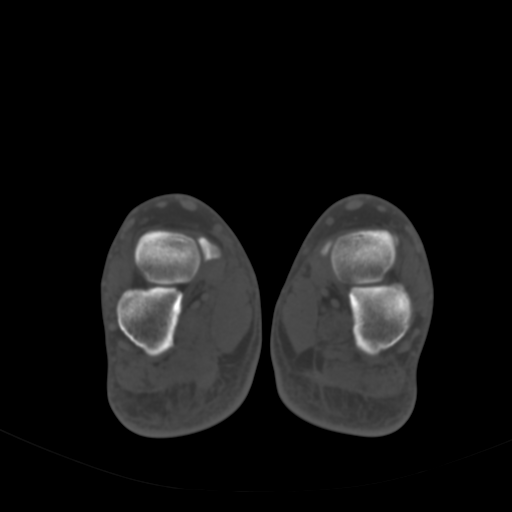
[im 113/136  soft-tissue]
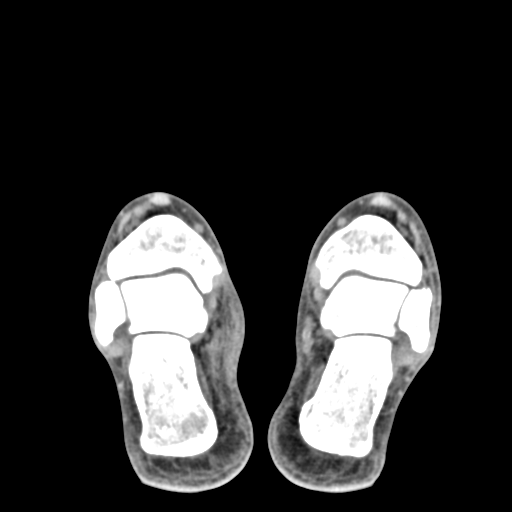
[im 113/136  bone]
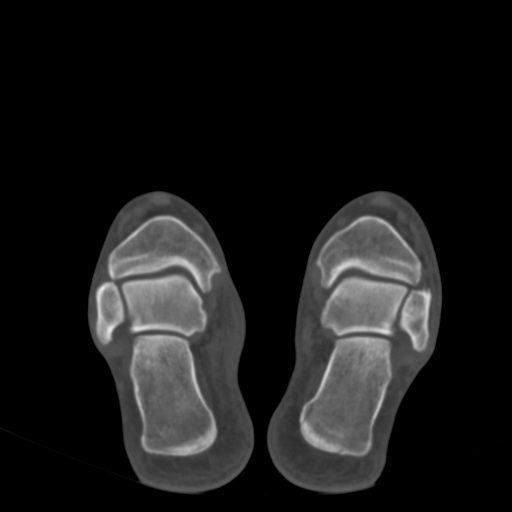

[Series 11: rt. foot bone windows · axial · 0.52mm/px · z∈[-673,-493]mm · 5 of 136 slices shown]
[im 23/136  bone]
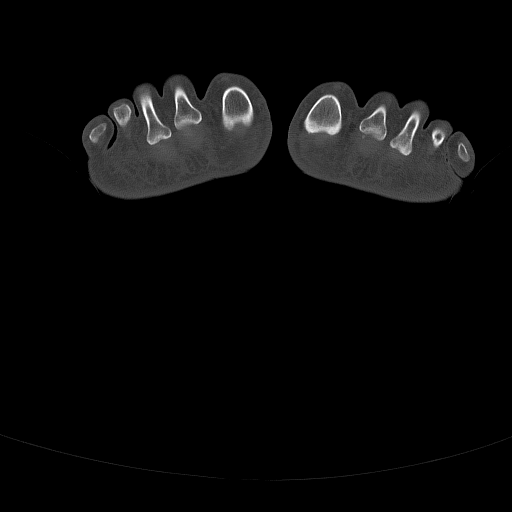
[im 46/136  bone]
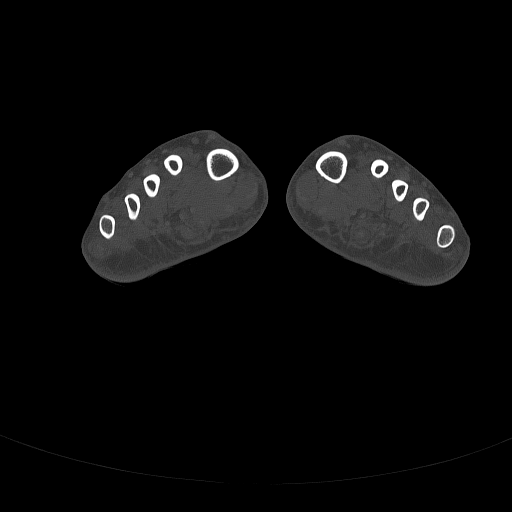
[im 68/136  bone]
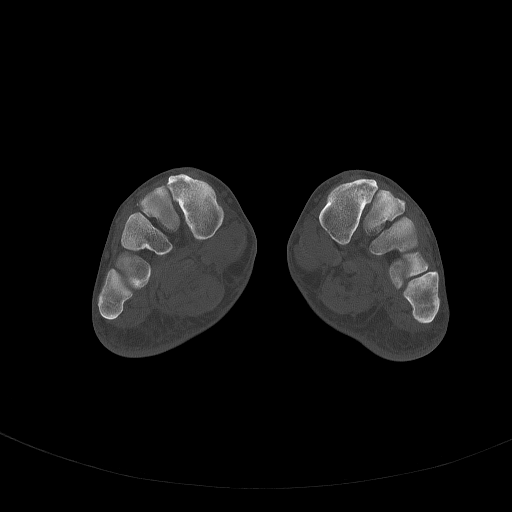
[im 91/136  bone]
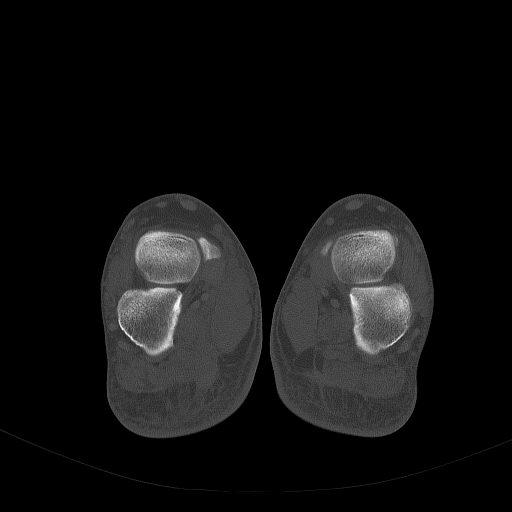
[im 113/136  bone]
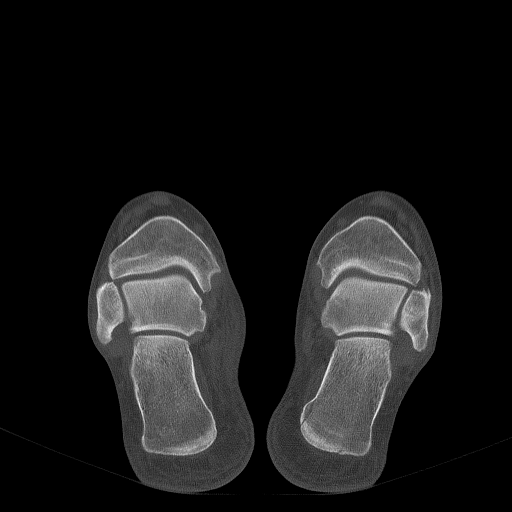

[Series 607: sag bone · sagittal · 0.72mm/px · 5 of 53 slices shown]
[im 9/53  bone]
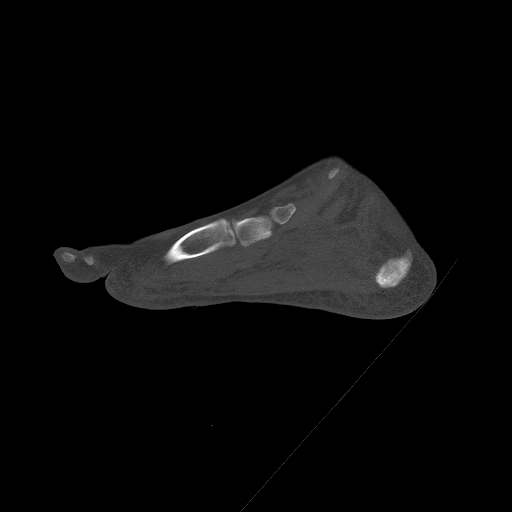
[im 18/53  bone]
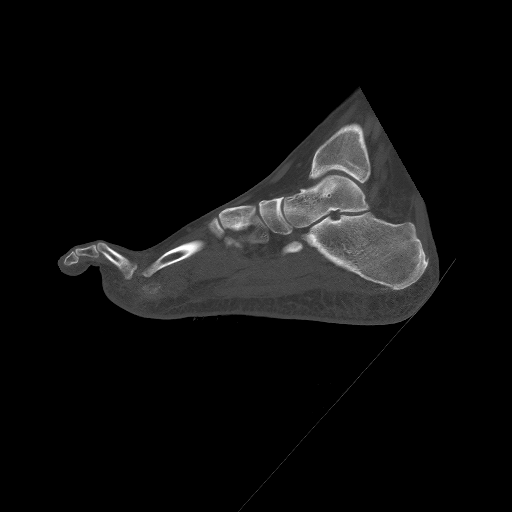
[im 27/53  bone]
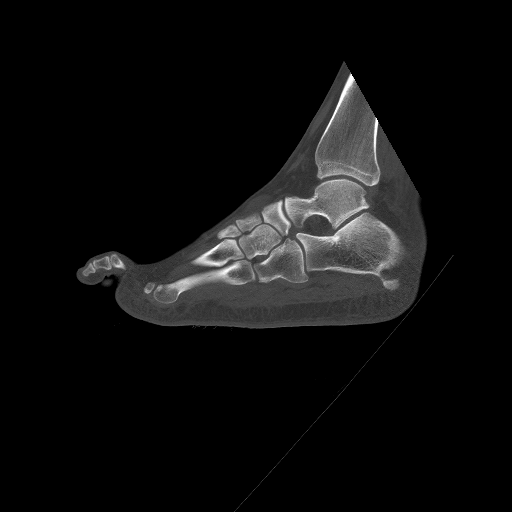
[im 35/53  bone]
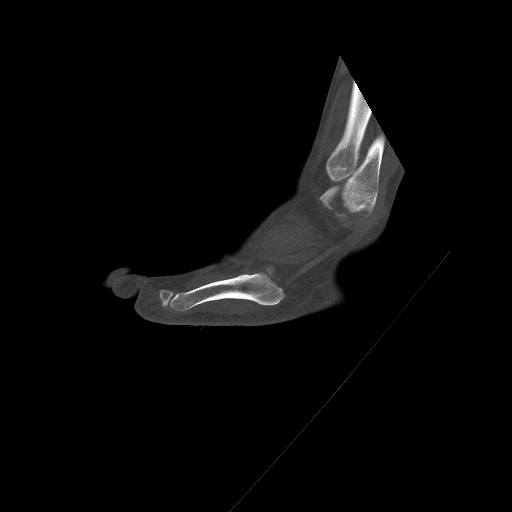
[im 44/53  bone]
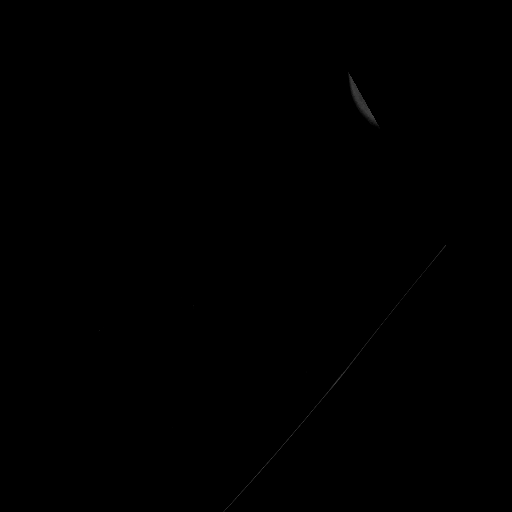

[15 of 27 positions shown; findings below may reference images not displayed]

FINDINGS: There are no fractures or other acute abnormalities of
the bones of the right foot.  There is slight dorsal spurring on
the talus. There is soft tissue edema at the medial aspect of the
ankle.
IMPRESSION: No acute osseous abnormality of the right foot.  Soft tissue
swelling primarily at the medial aspect of the ankle and foot.

Note is made that the patient does have a subtle comminuted
fracture of the LEFT calcaneus.

## 2013-02-13 MED ORDER — HYDROCODONE-ACETAMINOPHEN 5-325 MG PO TABS
2.0000 | ORAL_TABLET | Freq: Once | ORAL | Status: AC
  Administered 2013-02-13: 2 via ORAL
  Filled 2013-02-13: qty 2

## 2013-02-13 MED ORDER — OXYCODONE-ACETAMINOPHEN 5-325 MG PO TABS: ORAL_TABLET | ORAL | Status: DC

## 2013-02-13 NOTE — ED Notes (Signed)
Bed:WA10<BR> Expected date:<BR> Expected time:<BR> Means of arrival:<BR> Comments:<BR>

## 2013-02-13 NOTE — ED Notes (Signed)
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

## 2013-02-13 NOTE — Discharge Instructions (Signed)
 Calcaneal Fracture A fracture is a break in the bone. The calcaneus is the large irregular bone in the foot that makes up the heel of the foot. This bone can be fractured in many ways. There are many different ways of treating these fractures. There is not universal agreement on the treatment of these fractures and there is often more than one way of treating these fractures, all of which can be correct. TREATMENTS Calcaneal fractures can be treated with:  Immobilization, which means the fracture is casted as it is without changing the positions of the fracture (bone pieces) involved.  Closed reduction, in which the bones are manipulated back into position without opening the site of the fracture (break) using surgery.  ORIF (open reduction and internal fixation), in which the fracture site is opened and the bone pieces are fixed into place with some type of hardware (for example, screws, pins or plates).  Primary arthrodesis (fusion), which means that the joint has enough damage that a procedure is done as the first treatment which will leave the joint permanently stiff. This will decrease function, however usually will leave the joint pain free. Your caregiver will discuss the type of fracture you have and the treatment that will be best for that problem. If surgery is the treatment of choice, the following is information for you to know and also let your caregiver know about prior to surgery.  LET YOUR CAREGIVERS KNOW ABOUT:  Allergies.  Medications taken including herbs, eye drops, over the counter medications, and creams.  Use of steroids (by mouth or creams).  Previous problems with anesthetics or novocaine.  A family history of anesthetic complication.  Possibility of pregnancy, if this applies.  History of blood clots (thrombophlebitis).  History of bleeding or blood problems.  Previous surgery.  Other health problems. AFTER THE PROCEDURE After surgery, you will be taken to  the recovery area where a nurse will watch and check your progress. Once you are awake, stable, and taking fluids well, barring other problems you will be allowed to go home. Once home an ice pack applied to your operative site may help with discomfort and keep the swelling down. Elevate your foot above your heart for the first 7-10 days after surgery. Do this as much as possible. HOME CARE INSTRUCTIONS   Follow your caregiver's instructions as to activities, exercises, physical therapy, and driving a car. Do not drive a car until your caregiver specifically tells you it is safe to do so.  Use crutches as directed by your caregiver.  Daily exercise is helpful to prevent return of problems. Maintain strength and range of motion as instructed.  Only take over-the-counter or prescription medicines for pain, discomfort, or fever as directed by your caregiver.  Be certain to make all of your follow-up appointments. This is critical for optimal healing. SEEK MEDICAL CARE IF:   Increased bleeding (more than a small spot) from the wound or from beneath your cast or splint.  Redness, swelling, or increasing pain in the wound or from beneath your cast or splint.  Pus coming from wound or from beneath your cast or splint.  An unexplained oral temperature above 102 F (38.9 C) develops.  A foul smell coming from the wound or dressing or from beneath your cast, splint or removable fracture boot. SEEK IMMEDIATE MEDICAL CARE IF:   You develop a rash, have difficulty breathing, or have any problems which seem to be from an allergy .  You develop swelling or inability  to move your foot or toes.  You develop tingling or numbness in your foot or toes.  Your foot or toes turn blue, pale or cold.  You develop severe pain in the area of your injury. If you do not have a window in your cast for observing the wound, a discharge or minor bleeding may show up as a stain on the outside of your cast. Report  these findings to your caregiver. If you have been given a removable fracture boot, a small amount of bleeding through the dressings is normal. Change the dressings as instructed by your caregiver. MAKE SURE YOU:   Understand these instructions.  Will watch your condition.  Will get help right away if you are not doing well or get worse. Document Released: 07/04/2005 Document Revised: 12/17/2011 Document Reviewed: 04/28/2008 [facility] Patient Information 2013 [place], [place].   Narcotic and benzodiazepine use may cause drowsiness, slowed breathing or dependence.  Please use with caution and do not drive, operate machinery or watch young children alone while taking them.  Taking combinations of these medications or drinking alcohol will potentiate these effects.

## 2013-02-13 NOTE — Progress Notes (Signed)
[facility] referred patient to [facility].
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
## 2014-01-11 ENCOUNTER — Telehealth: Payer: Self-pay

## 2014-01-11 NOTE — Telephone Encounter (Signed)
Left message for call back Non-identifiable   NEW PATIENT 

## 2014-01-13 ENCOUNTER — Encounter: Payer: Self-pay | Admitting: Family Medicine

## 2014-01-13 ENCOUNTER — Ambulatory Visit (INDEPENDENT_AMBULATORY_CARE_PROVIDER_SITE_OTHER): Payer: BC Managed Care – PPO | Admitting: Family Medicine

## 2014-01-13 VITALS — BP 140/82 | HR 79 | Temp 98.2°F | Resp 16 | Ht 72.75 in | Wt 293.4 lb

## 2014-01-13 DIAGNOSIS — M7989 Other specified soft tissue disorders: Secondary | ICD-10-CM

## 2014-01-13 DIAGNOSIS — Z86711 Personal history of pulmonary embolism: Secondary | ICD-10-CM

## 2014-01-13 DIAGNOSIS — M799 Soft tissue disorder, unspecified: Secondary | ICD-10-CM

## 2014-01-13 DIAGNOSIS — E669 Obesity, unspecified: Secondary | ICD-10-CM

## 2014-01-13 DIAGNOSIS — E663 Overweight: Secondary | ICD-10-CM | POA: Insufficient documentation

## 2014-01-13 DIAGNOSIS — J309 Allergic rhinitis, unspecified: Secondary | ICD-10-CM

## 2014-01-13 LAB — CBC WITH DIFFERENTIAL/PLATELET
BASOS ABS: 0 10*3/uL (ref 0.0–0.1)
BASOS PCT: 0.5 % (ref 0.0–3.0)
EOS ABS: 0.1 10*3/uL (ref 0.0–0.7)
Eosinophils Relative: 0.6 % (ref 0.0–5.0)
HEMATOCRIT: 45 % (ref 39.0–52.0)
HEMOGLOBIN: 15.2 g/dL (ref 13.0–17.0)
LYMPHS ABS: 2.2 10*3/uL (ref 0.7–4.0)
Lymphocytes Relative: 22.5 % (ref 12.0–46.0)
MCHC: 33.7 g/dL (ref 30.0–36.0)
MCV: 91.7 fl (ref 78.0–100.0)
Monocytes Absolute: 0.6 10*3/uL (ref 0.1–1.0)
Monocytes Relative: 6 % (ref 3.0–12.0)
NEUTROS ABS: 6.8 10*3/uL (ref 1.4–7.7)
Neutrophils Relative %: 70.4 % (ref 43.0–77.0)
Platelets: 209 10*3/uL (ref 150.0–400.0)
RBC: 4.91 Mil/uL (ref 4.22–5.81)
RDW: 13.6 % (ref 11.5–14.6)
WBC: 9.6 10*3/uL (ref 4.5–10.5)

## 2014-01-13 LAB — BASIC METABOLIC PANEL
BUN: 14 mg/dL (ref 6–23)
CO2: 27 mEq/L (ref 19–32)
Calcium: 9.1 mg/dL (ref 8.4–10.5)
Chloride: 103 mEq/L (ref 96–112)
Creatinine, Ser: 1.2 mg/dL (ref 0.4–1.5)
GFR: 85.72 mL/min (ref >60.00)
Glucose, Bld: 89 mg/dL (ref 70–99)
Potassium: 4 mEq/L (ref 3.5–5.1)
Sodium: 139 mEq/L (ref 135–145)

## 2014-01-13 LAB — LIPID PANEL
Cholesterol: 188 mg/dL (ref 0–200)
HDL: 33.4 mg/dL — ABNORMAL LOW (ref >39.00)
LDL Cholesterol: 123 mg/dL — ABNORMAL HIGH (ref 0–99)
Total CHOL/HDL Ratio: 6
Triglycerides: 160 mg/dL — ABNORMAL HIGH (ref 0.0–149.0)
VLDL: 32 mg/dL (ref 0.0–40.0)

## 2014-01-13 LAB — TSH: TSH: 1.01 u[IU]/mL (ref 0.35–5.50)

## 2014-01-13 LAB — HEPATIC FUNCTION PANEL
ALT: 21 U/L (ref 0–53)
AST: 19 U/L (ref 0–37)
Albumin: 4 g/dL (ref 3.5–5.2)
Alkaline Phosphatase: 61 U/L (ref 39–117)
Bilirubin, Direct: 0 mg/dL (ref 0.0–0.3)
Total Bilirubin: 0.8 mg/dL (ref 0.3–1.2)
Total Protein: 7.8 g/dL (ref 6.0–8.3)

## 2014-01-13 LAB — HEMOGLOBIN A1C: HEMOGLOBIN A1C: 6.3 % (ref 4.6–6.5)

## 2014-01-13 NOTE — Assessment & Plan Note (Signed)
New to provider.  Pt explains that this happened in the setting of ankle fracture.  Discussed w/ pt that if he develops a subsequent clot, he will need heme w/u.  Pt expressed understanding and is in agreement w/ plan.

## 2014-01-13 NOTE — Assessment & Plan Note (Signed)
New.  Start Claritin or Zyrtec daily to improve allergy symptoms.  Pt expressed understanding and is in agreement w/ plan.

## 2014-01-13 NOTE — Assessment & Plan Note (Signed)
New to provider, ongoing for pt.  Check labs to risk stratify.  Reviewed importance of healthy diet and regular exercise.  Will follow.

## 2014-01-13 NOTE — Progress Notes (Signed)
   Subjective:    Patient ID: Cameron Kim, male    DOB: April 10, 1971, 43 y.o.   MRN: [id number]  HPI New to establish.  Previous MD- none recently  Hx of PE- occurred in setting of ankle fx.  Not currently on blood thinners.  Did not see hematology.  Pt concerned for possible elevated cholesterol and DM.  Working on losing weight- gained 40 lbs w/in months of getting married.  Admits to not working out.  Seasonal allergies- pt reports sxs ongoing for years.  Last week developed nasal congestion, PND, sneezing, watery eyes.    Knot on head- not painful, mobile.  First noticed 6-8 months ago.    Review of Systems For ROS see HPI     Objective:   Physical Exam  Vitals reviewed. Constitutional: He is oriented to person, place, and time. He appears well-developed and well-nourished. No distress.  obese  HENT:  Head: Normocephalic and atraumatic.  Soft, 1.5cm mobile soft tissue mass on occiput  Eyes: Conjunctivae and EOM are normal. Pupils are equal, round, and reactive to light.  Neck: Normal range of motion. Neck supple. No thyromegaly present.  Cardiovascular: Normal rate, regular rhythm, normal heart sounds and intact distal pulses.   No murmur heard. Pulmonary/Chest: Effort normal and breath sounds normal. No respiratory distress.  Abdominal: Soft. Bowel sounds are normal. He exhibits no distension.  Musculoskeletal: He exhibits no edema.  Lymphadenopathy:    He has no cervical adenopathy.  Neurological: He is alert and oriented to person, place, and time. No cranial nerve deficit.  Skin: Skin is warm and dry.  Psychiatric: He has a normal mood and affect. His behavior is normal.          Assessment & Plan:

## 2014-01-13 NOTE — Assessment & Plan Note (Signed)
New.  Pt w/ small soft tissue mass on occiput.  Suspect cyst vs lipoma.  Will get US to assess.  Pt expressed understanding and is in agreement w/ plan.

## 2014-01-13 NOTE — Patient Instructions (Signed)
Schedule your complete physical in 6 months We'll notify you of your lab results and make any changes if needed Try and get regular exercise and make healthy food choices We'll call you with your US appt to assess the bump on your head Call with any questions or concerns Welcome!  We're glad to have you!

## 2014-01-13 NOTE — Progress Notes (Signed)
Pre visit review using our clinic review tool, if applicable. No additional management support is needed unless otherwise documented below in the visit note. 

## 2014-01-14 ENCOUNTER — Encounter: Payer: Self-pay | Admitting: General Practice

## 2014-01-18 ENCOUNTER — Encounter: Payer: Self-pay | Admitting: Family Medicine

## 2014-01-18 ENCOUNTER — Ambulatory Visit (INDEPENDENT_AMBULATORY_CARE_PROVIDER_SITE_OTHER): Payer: BC Managed Care – PPO | Admitting: Family Medicine

## 2014-01-18 ENCOUNTER — Telehealth: Payer: Self-pay | Admitting: *Deleted

## 2014-01-18 VITALS — BP 140/82 | HR 85 | Temp 98.2°F | Resp 16 | Wt 290.5 lb

## 2014-01-18 DIAGNOSIS — I452 Bifascicular block: Secondary | ICD-10-CM | POA: Insufficient documentation

## 2014-01-18 DIAGNOSIS — R197 Diarrhea, unspecified: Secondary | ICD-10-CM | POA: Insufficient documentation

## 2014-01-18 DIAGNOSIS — R42 Dizziness and giddiness: Secondary | ICD-10-CM | POA: Insufficient documentation

## 2014-01-18 MED ORDER — ONDANSETRON 4 MG PO TBDP: 4.0000 mg | ORAL_TABLET | Freq: Three times a day (TID) | ORAL | Status: DC | PRN

## 2014-01-18 NOTE — Progress Notes (Signed)
   Subjective:    Patient ID: Cameron Kim, male    DOB: 11-07-1970, 43 y.o.   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  Was doing a job on Saturday- drank fluids, ate breakfast and lunch.  Pt didn't feel that lunch 'sat well'.  Then developed nausea, cold sweat, vomiting, diarrhea later in church.  Pt reports church called EMS and they wanted to take him to the hospital due to abnormal EKG.  Had diarrhea all day yesterday.  Was light headed yesterday.  No CP, SOB.  No blood in diarrhea.   Review of Systems For ROS see HPI     Objective:   Physical Exam  Vitals reviewed. Constitutional: He is oriented to person, place, and time. He appears well-developed and well-nourished. No distress.  HENT:  Head: Normocephalic and atraumatic.  Neck: Neck supple.  Cardiovascular: Normal rate, regular rhythm, normal heart sounds and intact distal pulses.   Pulmonary/Chest: Effort normal and breath sounds normal. No respiratory distress. He has no wheezes. He has no rales.  Abdominal: Soft. Bowel sounds are normal. He exhibits no distension. There is no tenderness. There is no rebound and no guarding.  Musculoskeletal: He exhibits no edema.  Lymphadenopathy:    He has no cervical adenopathy.  Neurological: He is alert and oriented to person, place, and time. He has normal reflexes. No cranial nerve deficit. Coordination normal.  Skin: Skin is warm and dry.  Psychiatric: He has a normal mood and affect. His behavior is normal.          Assessment & Plan:

## 2014-01-18 NOTE — Assessment & Plan Note (Signed)
New.  Pt had an episode just prior to vomiting and having diarrhea.  Unclear as to whether the dizziness accompanied the GI symptoms or were independent.  Increase fluid intake.  Change positions slowly.  Cards referral for RBBB.  Reviewed supportive care and red flags that should prompt return.  Pt expressed understanding and is in agreement w/ plan.

## 2014-01-18 NOTE — Progress Notes (Signed)
Pre visit review using our clinic review tool, if applicable. No additional management support is needed unless otherwise documented below in the visit note. 

## 2014-01-18 NOTE — Telephone Encounter (Signed)
Spoke with patients wife who stated patient experienced an episode of nausea and sweating during church services Sat night. Patient is contributing this to "eating something bad that made him sick" per his wife. Patients wife stated that EMS was called and they did an EKG and was told "it showed something abnormal." I asked the patients wife if he was having any chest discomfort, arm pain, neck or back pain. She denies any pain only frequent diarrhea. Appt scheduled for today with Dr. Beverely Lowabori.

## 2014-01-18 NOTE — Patient Instructions (Signed)
Follow up as needed We'll call you with your cardiology appt Drink plenty of fluids REST! Zofran as needed for nausea Immodium as needed for diarrhea Call with any questions or concerns- particularly if symptoms change or worsen Hang in there!!!

## 2014-01-18 NOTE — Assessment & Plan Note (Signed)
New.  Pt reports this was the same read they got on the EKG on Saturday.  No CP, no SOB today.  Unclear as to whether pt's dizziness on Saturday was due to cardiac cause of his case of apparent food poisoning (this seems more likely).  Refer to Cardiology for urgent evaluation as pt has a physically demanding job.  Will follow closely.

## 2014-01-18 NOTE — Assessment & Plan Note (Signed)
New.  Suspect this was due to something pt ate on Saturday.  sxs are already improving w/o intervention.  Start Zofran prn.  immodium if loose stools continue.  Will follow.

## 2014-01-18 NOTE — Telephone Encounter (Signed)
Unable to reach pre visit.  

## 2014-01-19 ENCOUNTER — Ambulatory Visit
Admission: RE | Admit: 2014-01-19 | Discharge: 2014-01-19 | Disposition: A | Discharge status: Home / Self Care | Payer: BC Managed Care – PPO | Source: Ambulatory Visit | Attending: Family Medicine | Admitting: Family Medicine

## 2014-01-19 DIAGNOSIS — M7989 Other specified soft tissue disorders: Secondary | ICD-10-CM

## 2014-01-19 IMAGING — US US SOFT TISSUE HEAD/NECK
1 series · 14 of 19 positions shown · non-contrast
Comparison: None.

CLINICAL DATA: Soft tissue mass about the posterior skull.

EXAM:
ULTRASOUND OF HEAD/NECK SOFT TISSUES
TECHNIQUE: Ultrasound examination of the head and neck soft tissues was
performed in the area of clinical concern.

[Series 1: us soft tissue head/neck · 0.06mm/px · 19 acquisitions, 14 frames shown]
[im 1/19]
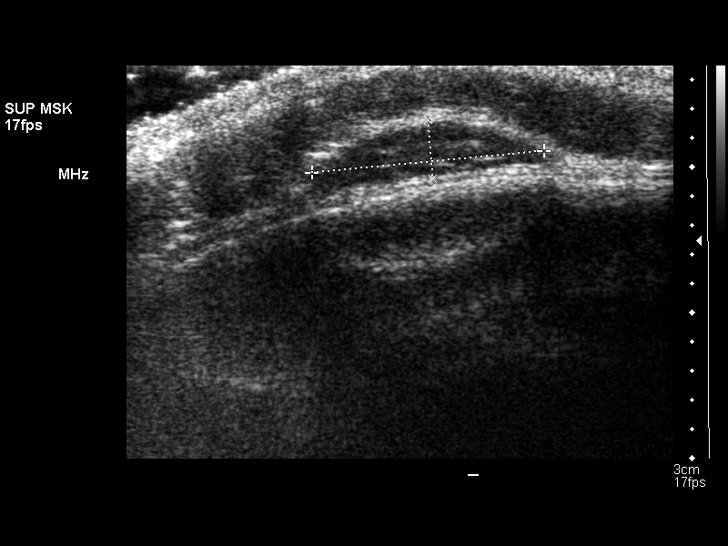
[im 3/19]
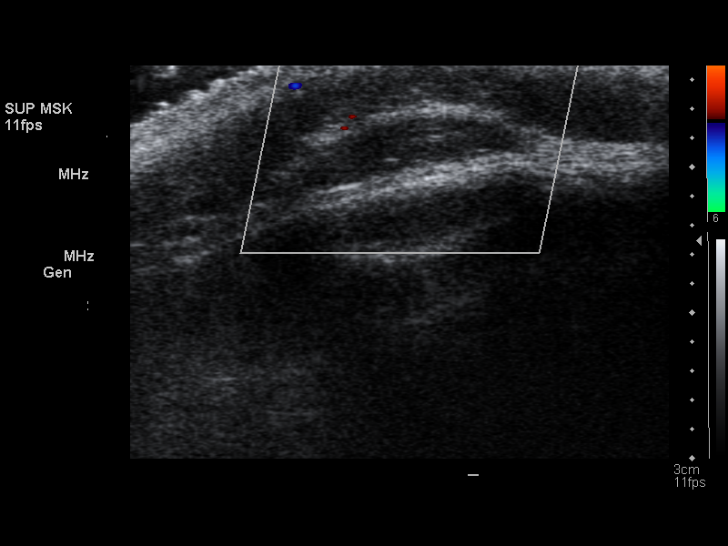
[im 4/19]
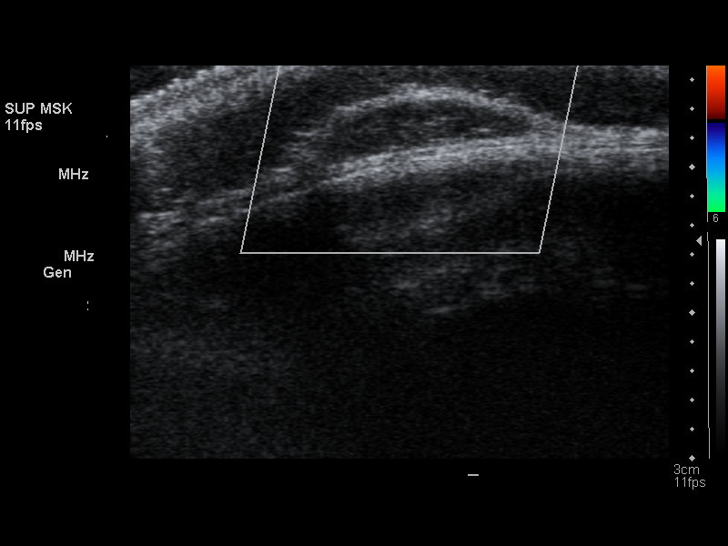
[im 5/19]
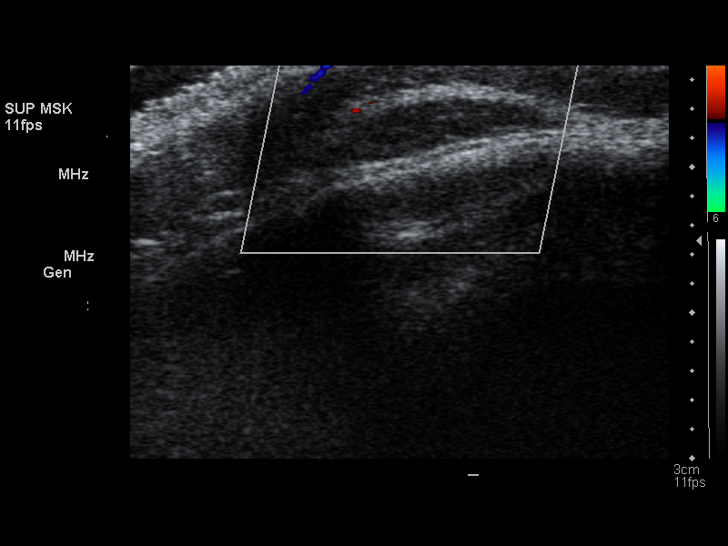
[im 7/19]
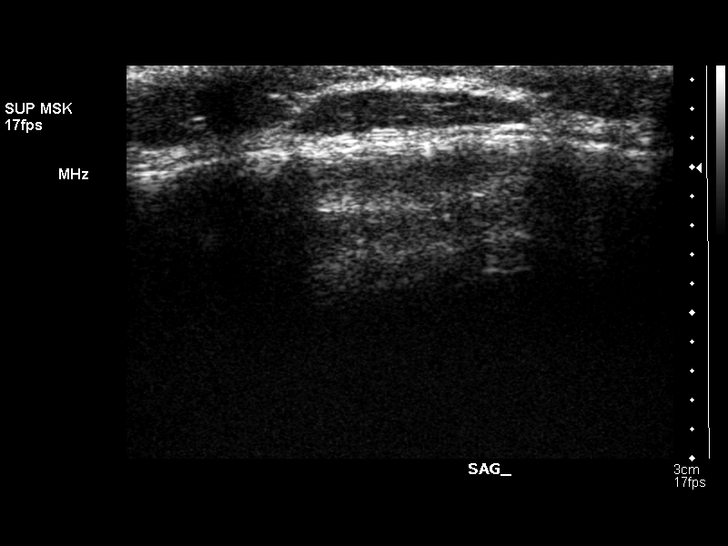
[im 8/19]
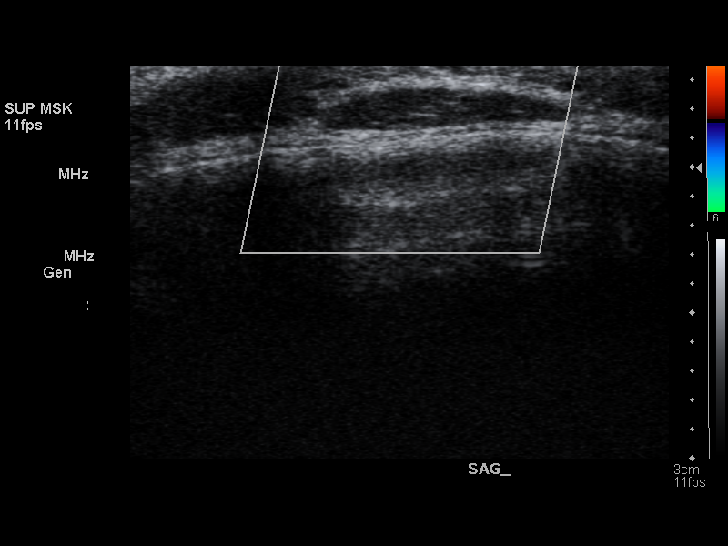
[im 9/19]
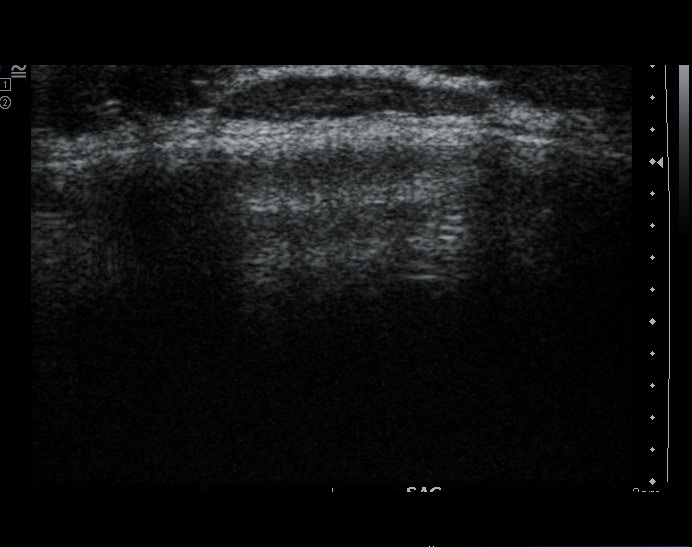
[im 11/19]
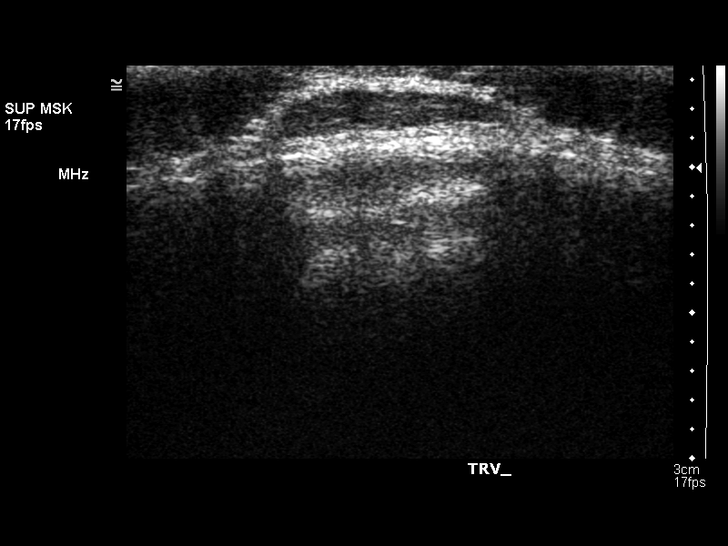
[im 12/19]
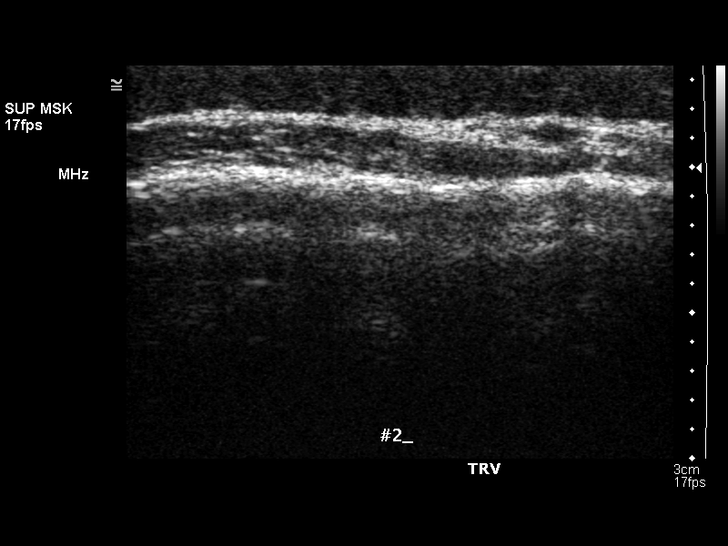
[im 13/19]
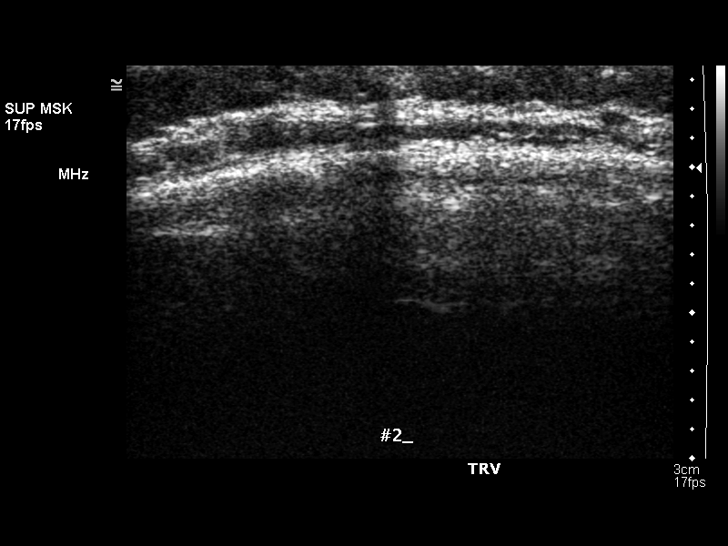
[im 15/19]
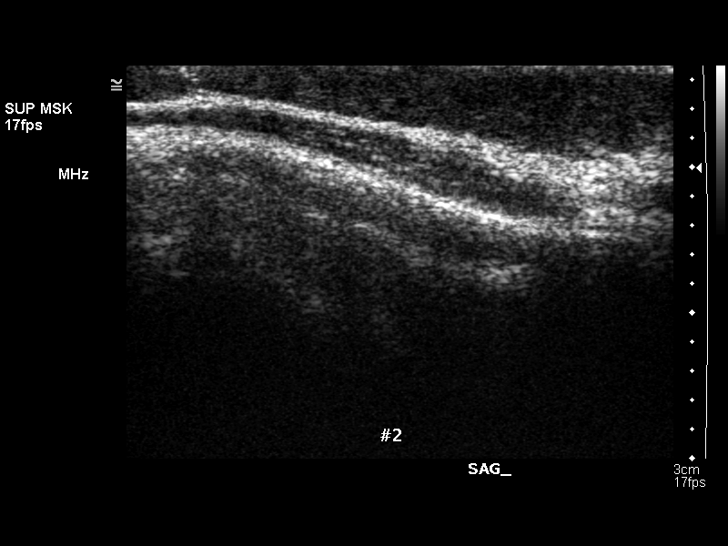
[im 16/19]
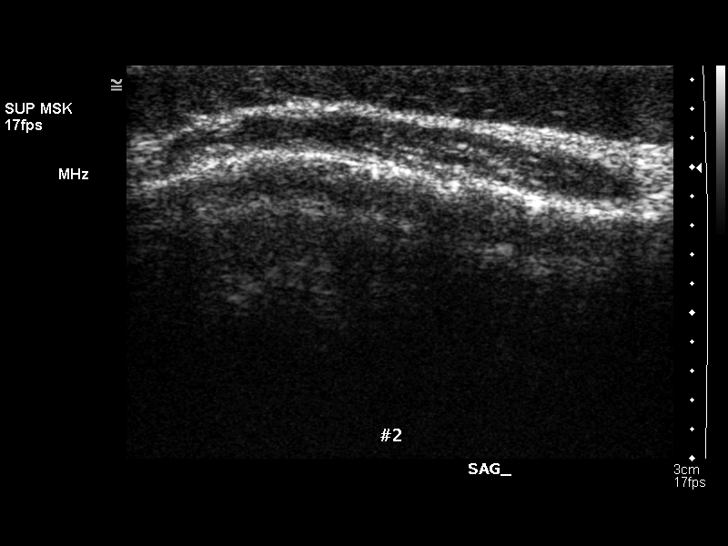
[im 17/19]
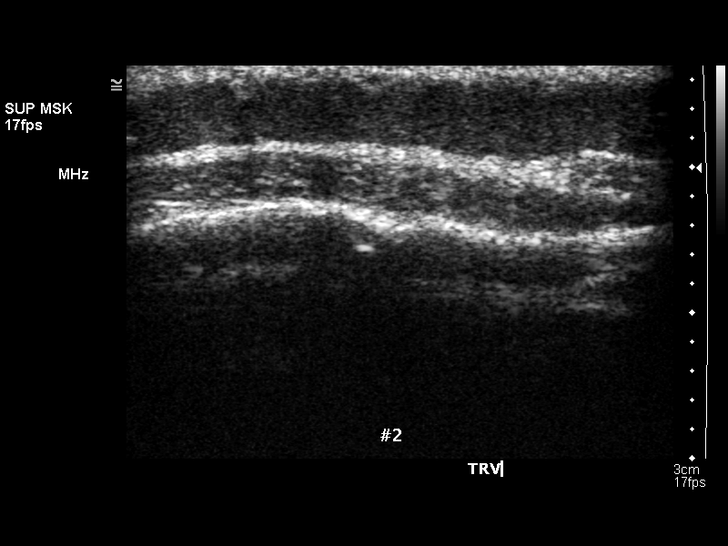
[im 19/19]
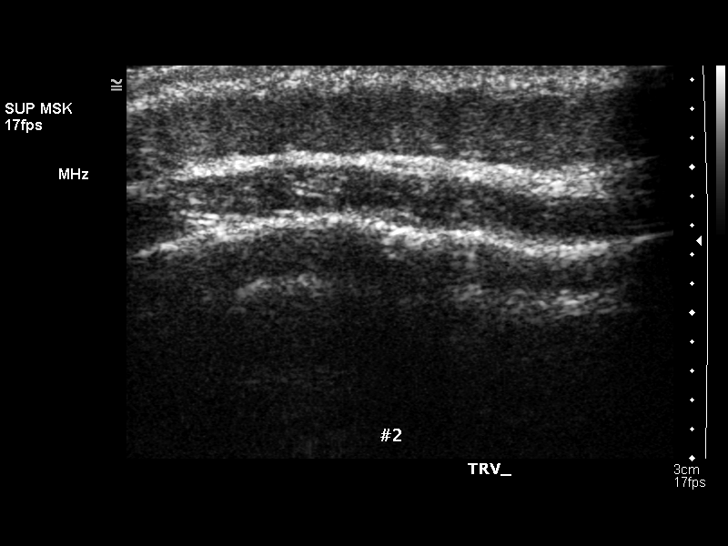

[14 of 19 positions shown; findings below may reference images not displayed]

FINDINGS: 2 palpable abnormalities are described by the patient. The first,
slightly left of midline and posteriorly, corresponds to a scalp
nonspecific hypoechoic solid lesion which measures 1.6 by 0.4 x
cm.

The second palpable abnormality, lateral to the first, is without
underlying ultrasound correlate.
IMPRESSION: Nonspecific soft tissue echogenicity scalp lesion corresponding to
the palpable abnormality left of midline posteriorly. If further
characterization is desired, consider CT.

A more lateral scalp palpable abnormality is without ultrasound
correlate.

## 2014-01-20 ENCOUNTER — Telehealth: Payer: Self-pay | Admitting: Family Medicine

## 2014-01-20 ENCOUNTER — Other Ambulatory Visit: Payer: Self-pay | Admitting: Family Medicine

## 2014-01-20 DIAGNOSIS — M7989 Other specified soft tissue disorders: Secondary | ICD-10-CM

## 2014-01-20 NOTE — Telephone Encounter (Signed)
Patient called to see if his US results were back. Please advise.

## 2014-01-20 NOTE — Telephone Encounter (Signed)
Pt notified of labs

## 2014-01-27 ENCOUNTER — Ambulatory Visit: Payer: BC Managed Care – PPO | Admitting: Cardiology

## 2014-02-03 ENCOUNTER — Ambulatory Visit: Payer: BC Managed Care – PPO | Admitting: Cardiovascular Disease

## 2014-02-05 ENCOUNTER — Encounter: Payer: Self-pay | Admitting: Family Medicine

## 2014-02-17 ENCOUNTER — Ambulatory Visit (INDEPENDENT_AMBULATORY_CARE_PROVIDER_SITE_OTHER): Payer: BC Managed Care – PPO | Admitting: Cardiovascular Disease

## 2014-02-17 ENCOUNTER — Encounter: Payer: Self-pay | Admitting: Cardiovascular Disease

## 2014-02-17 VITALS — BP 129/82 | HR 89 | Ht 74.0 in | Wt 290.0 lb

## 2014-02-17 DIAGNOSIS — I452 Bifascicular block: Secondary | ICD-10-CM

## 2014-02-17 DIAGNOSIS — R9431 Abnormal electrocardiogram [ECG] [EKG]: Secondary | ICD-10-CM

## 2014-02-17 NOTE — Progress Notes (Signed)
     02/17/2014 Cameron Kim   1971/02/14  [id number]  Primary Physician Neena RhymesKatherine Tabori, MD Primary Cardiologist: Runell GessJonathan J. Berry MD Roseanne RenoFACP,FACC,FAHA, FSCAI   HPI:  Cameron Kim is a 43 year old moderately overweight married African American male with no children referred by Dr. Beverely Lowabori for  an abnormal EKG and atypical chest pain. He has no clinically factors. His mother did have heart disease in her early 8670s. He has never had a heart attack or stroke. He had an episode of diaphoresis and lightheadedness are checked recently. He saw his primary care physician who did an EKG that showed bifascicular block and was referred here for further evaluation. He does have a typical musculoskeletal type chest pain but nothing that sounds ischemic.   No current outpatient prescriptions on file.   No current facility-administered medications for this visit.    No Known Allergies  History   Social History  . Marital Status: Married    Spouse Name: N/A    Number of Children: N/A  . Years of Education: N/A   Occupational History  . Not on file.   Social History Main Topics  . Smoking status: Never Smoker   . Smokeless tobacco: Not on file  . Alcohol Use: No  . Drug Use: No  . Sexual Activity: Yes   Other Topics Concern  . Not on file   Social History Narrative  . No narrative on file     Review of Systems: General: negative for chills, fever, night sweats or weight changes.  Cardiovascular: negative for chest pain, dyspnea on exertion, edema, orthopnea, palpitations, paroxysmal nocturnal dyspnea or shortness of breath Dermatological: negative for rash Respiratory: negative for cough or wheezing Urologic: negative for hematuria Abdominal: negative for nausea, vomiting, diarrhea, bright red blood per rectum, melena, or hematemesis Neurologic: negative for visual changes, syncope, or dizziness All other systems reviewed and are otherwise negative except as noted above.    Blood  pressure 129/82, pulse 89, height 6\' 2"  (1.88 m), weight 290 lb (131.543 kg).  General appearance: alert and no distress Neck: no adenopathy, no carotid bruit, no JVD, supple, symmetrical, trachea midline and thyroid not enlarged, symmetric, no tenderness/mass/nodules Lungs: clear to auscultation bilaterally Heart: regular rate and rhythm, S1, S2 normal, no murmur, click, rub or gallop Extremities: extremities normal, atraumatic, no cyanosis or edema and 2+ pedal pulses bilaterally  EKG not performed today  ASSESSMENT AND PLAN:   Right bundle branch block and left anterior fascicular block Cameron Kim was referred for that medication of diaphoresis, atypical chest pain and abnormal EKG. He has no cardiac risk factors. He had an episode of diaphoresis and lightheadedness while at church recently. His EKG shows bifascicular block. It's unclear whether this is a new finding. His exam is entirely benign. I'm going to a 2-D echo to rule out any wall motion abnormality.      Runell GessJonathan J. Berry MD FACP,FACC,FAHA, [facility] CenterFSCAI 02/17/2014 4:41 PM

## 2014-02-17 NOTE — Patient Instructions (Signed)
  We will see you back in follow up as needed.   Dr Berry has ordered:  Echocardiogram. Echocardiography is a painless test that uses sound waves to create images of your heart. It provides your doctor with information about the size and shape of your heart and how well your heart's chambers and valves are working. This procedure takes approximately one hour. There are no restrictions for this procedure.      

## 2014-02-17 NOTE — Assessment & Plan Note (Signed)
Mr. Cameron Kim was referred for that medication of diaphoresis, atypical chest pain and abnormal EKG. He has no cardiac risk factors. He had an episode of diaphoresis and lightheadedness while at church recently. His EKG shows bifascicular block. It's unclear whether this is a new finding. His exam is entirely benign. I'm going to a 2-D echo to rule out any wall motion abnormality.

## 2014-02-19 ENCOUNTER — Ambulatory Visit
Admission: RE | Admit: 2014-02-19 | Discharge: 2014-02-19 | Disposition: A | Discharge status: Home / Self Care | Payer: BC Managed Care – PPO | Source: Ambulatory Visit | Attending: Family Medicine | Admitting: Family Medicine

## 2014-02-19 DIAGNOSIS — M7989 Other specified soft tissue disorders: Secondary | ICD-10-CM

## 2014-02-23 ENCOUNTER — Encounter: Payer: Self-pay | Admitting: *Deleted

## 2014-02-23 ENCOUNTER — Ambulatory Visit (HOSPITAL_COMMUNITY)
Admission: RE | Admit: 2014-02-23 | Discharge: 2014-02-23 | Disposition: A | Discharge status: Home / Self Care | Payer: BC Managed Care – PPO | Source: Ambulatory Visit | Attending: Internal Medicine | Admitting: Internal Medicine

## 2014-02-23 DIAGNOSIS — I369 Nonrheumatic tricuspid valve disorder, unspecified: Secondary | ICD-10-CM

## 2014-02-23 DIAGNOSIS — R9431 Abnormal electrocardiogram [ECG] [EKG]: Secondary | ICD-10-CM | POA: Insufficient documentation

## 2014-02-23 NOTE — Progress Notes (Signed)
2D Echo Performed 02/23/2014    Chasitty Hehl, RCS  

## 2014-02-24 ENCOUNTER — Ambulatory Visit (HOSPITAL_COMMUNITY): Payer: BC Managed Care – PPO

## 2014-03-09 ENCOUNTER — Telehealth: Payer: Self-pay | Admitting: Cardiovascular Disease

## 2014-03-09 NOTE — Telephone Encounter (Signed)
Spoke to wife. Echo Result given. Verbalized understanding. RN informed wife a letter was mailed to their address- Wife states she has not been to their PO [address] yet.

## 2014-03-09 NOTE — Telephone Encounter (Signed)
Wants echo results from 02-23-14 please.

## 2014-05-20 ENCOUNTER — Emergency Department (HOSPITAL_COMMUNITY)
Admission: EM | Admit: 2014-05-20 | Discharge: 2014-05-20 | Disposition: A | Discharge status: Home / Self Care | Payer: BC Managed Care – PPO | Attending: Emergency Medicine | Admitting: Emergency Medicine

## 2014-05-20 ENCOUNTER — Emergency Department (HOSPITAL_COMMUNITY): Payer: BC Managed Care – PPO

## 2014-05-20 ENCOUNTER — Encounter (HOSPITAL_COMMUNITY): Payer: Self-pay | Admitting: Emergency Medicine

## 2014-05-20 DIAGNOSIS — S199XXA Unspecified injury of neck, initial encounter: Secondary | ICD-10-CM

## 2014-05-20 DIAGNOSIS — IMO0002 Reserved for concepts with insufficient information to code with codable children: Secondary | ICD-10-CM | POA: Insufficient documentation

## 2014-05-20 DIAGNOSIS — Y9389 Activity, other specified: Secondary | ICD-10-CM | POA: Insufficient documentation

## 2014-05-20 DIAGNOSIS — Z86711 Personal history of pulmonary embolism: Secondary | ICD-10-CM | POA: Diagnosis not present

## 2014-05-20 DIAGNOSIS — Y92410 Unspecified street and highway as the place of occurrence of the external cause: Secondary | ICD-10-CM | POA: Insufficient documentation

## 2014-05-20 DIAGNOSIS — T148XXA Other injury of unspecified body region, initial encounter: Secondary | ICD-10-CM | POA: Diagnosis not present

## 2014-05-20 DIAGNOSIS — Z79899 Other long term (current) drug therapy: Secondary | ICD-10-CM | POA: Diagnosis not present

## 2014-05-20 DIAGNOSIS — S0993XA Unspecified injury of face, initial encounter: Secondary | ICD-10-CM | POA: Insufficient documentation
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
## 2014-05-20 MED ORDER — HYDROCODONE-ACETAMINOPHEN 5-325 MG PO TABS
1.0000 | ORAL_TABLET | Freq: Once | ORAL | Status: AC
  Administered 2014-05-20: 1 via ORAL
  Filled 2014-05-20: qty 1

## 2014-05-20 MED ORDER — DIAZEPAM 5 MG PO TABS
5.0000 mg | ORAL_TABLET | Freq: Once | ORAL | Status: AC
Start: 2014-05-20 — End: 2014-05-20
  Administered 2014-05-20: 5 mg via ORAL
  Filled 2014-05-20: qty 1

## 2014-05-20 MED ORDER — IBUPROFEN 600 MG PO TABS: 600.0000 mg | ORAL_TABLET | Freq: Four times a day (QID) | ORAL | Status: DC | PRN

## 2014-05-20 MED ORDER — IBUPROFEN 200 MG PO TABS
600.0000 mg | ORAL_TABLET | Freq: Once | ORAL | Status: AC
  Administered 2014-05-20: 600 mg via ORAL
  Filled 2014-05-20: qty 3

## 2014-05-20 MED ORDER — TRAMADOL HCL 50 MG PO TABS: 50.0000 mg | ORAL_TABLET | Freq: Four times a day (QID) | ORAL | Status: DC | PRN

## 2014-05-20 MED ORDER — METHOCARBAMOL 500 MG PO TABS: 500.0000 mg | ORAL_TABLET | Freq: Two times a day (BID) | ORAL | Status: DC
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
## 2014-06-02 ENCOUNTER — Ambulatory Visit (INDEPENDENT_AMBULATORY_CARE_PROVIDER_SITE_OTHER): Payer: BC Managed Care – PPO | Admitting: Family Medicine

## 2014-06-02 ENCOUNTER — Encounter: Payer: Self-pay | Admitting: Family Medicine

## 2014-06-02 VITALS — BP 130/80 | HR 86 | Temp 98.0°F | Resp 16 | Wt 274.5 lb

## 2014-06-02 DIAGNOSIS — R0789 Other chest pain: Secondary | ICD-10-CM

## 2014-06-02 DIAGNOSIS — M899 Disorder of bone, unspecified: Secondary | ICD-10-CM

## 2014-06-02 DIAGNOSIS — M949 Disorder of cartilage, unspecified: Secondary | ICD-10-CM

## 2014-06-02 DIAGNOSIS — M62838 Other muscle spasm: Secondary | ICD-10-CM

## 2014-06-02 DIAGNOSIS — M533 Sacrococcygeal disorders, not elsewhere classified: Secondary | ICD-10-CM

## 2014-06-02 NOTE — Progress Notes (Signed)
Pre visit review using our clinic review tool, if applicable. No additional management support is needed unless otherwise documented below in the visit note. 
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
## 2014-06-02 NOTE — Patient Instructions (Signed)
Follow up as needed Continue the anti-inflammatory regularly- take w/ food Use the Robaxin for spasm HEAT! Gentle stretching to avoid stiffness [facility] Call with any questions or concerns Hang in there!!!

## 2014-06-06 DIAGNOSIS — M62838 Other muscle spasm: Secondary | ICD-10-CM | POA: Insufficient documentation

## 2014-06-06 DIAGNOSIS — R0789 Other chest pain: Secondary | ICD-10-CM | POA: Insufficient documentation

## 2014-06-06 DIAGNOSIS — M533 Sacrococcygeal disorders, not elsewhere classified: Secondary | ICD-10-CM | POA: Insufficient documentation

## 2014-06-06 NOTE — Assessment & Plan Note (Signed)
New.  No abnormal motion on PE to suggest fracture.  Likely bruising or chondritis.  Continue NSAIDs.  Reviewed supportive care and red flags that should prompt return.  Pt expressed understanding and is in agreement w/ plan.
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
## 2014-07-15 ENCOUNTER — Encounter: Payer: BC Managed Care – PPO | Admitting: Family Medicine

## 2014-08-12 ENCOUNTER — Encounter: Payer: BC Managed Care – PPO | Admitting: Family Medicine

## 2014-08-12 ENCOUNTER — Telehealth: Payer: Self-pay | Admitting: *Deleted

## 2014-08-12 DIAGNOSIS — Z0289 Encounter for other administrative examinations: Secondary | ICD-10-CM

## 2014-08-12 NOTE — Telephone Encounter (Signed)
See note below

## 2014-08-12 NOTE — Telephone Encounter (Signed)
Pt needs no-show fee 
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
## 2014-12-03 ENCOUNTER — Encounter: Payer: Self-pay | Admitting: Family Medicine

## 2014-12-03 ENCOUNTER — Ambulatory Visit (INDEPENDENT_AMBULATORY_CARE_PROVIDER_SITE_OTHER): Payer: BLUE CROSS/BLUE SHIELD | Admitting: Family Medicine

## 2014-12-03 VITALS — BP 126/78 | HR 73 | Temp 97.6°F | Resp 16 | Ht 73.0 in | Wt 257.2 lb

## 2014-12-03 DIAGNOSIS — Z23 Encounter for immunization: Secondary | ICD-10-CM

## 2014-12-03 DIAGNOSIS — Z0000 Encounter for general adult medical examination without abnormal findings: Secondary | ICD-10-CM

## 2014-12-03 NOTE — Assessment & Plan Note (Signed)
Pt's PE WNL.  Applauded his recent 30 lb weight loss and new healthy lifestyle.  Check labs.  Anticipatory guidance provided.

## 2014-12-03 NOTE — Progress Notes (Signed)
   Subjective:    Patient ID: Cameron Kim, male    DOB: 07-Jul-1971, 44 y.o.   MRN: [id number]  HPI CPE- pt has lost nearly 30 lbs since the summer.   Review of Systems Patient reports no vision/hearing changes, anorexia, fever ,adenopathy, persistant/recurrent hoarseness, swallowing issues, chest pain, palpitations, edema, persistant/recurrent cough, hemoptysis, dyspnea (rest,exertional, paroxysmal nocturnal), gastrointestinal  bleeding (melena, rectal bleeding), abdominal pain, excessive heart burn, GU symptoms (dysuria, hematuria, voiding/incontinence issues) syncope, focal weakness, memory loss, numbness & tingling, skin/hair/nail changes, depression, anxiety, abnormal bruising/bleeding, musculoskeletal symptoms/signs.   Reviewed meds, allergies, problem list, and PMH in chart     Objective:   Physical Exam General Appearance:    Alert, cooperative, no distress, appears stated age  Head:    Normocephalic, without obvious abnormality, atraumatic  Eyes:    PERRL, conjunctiva/corneas clear, EOM's intact, fundi    benign, both eyes       Ears:    Normal TM's and external ear canals, both ears  Nose:   Nares normal, septum midline, mucosa normal, no drainage   or sinus tenderness  Throat:   Lips, mucosa, and tongue normal; teeth and gums normal  Neck:   Supple, symmetrical, trachea midline, no adenopathy;       thyroid:  No enlargement/tenderness/nodules  Back:     Symmetric, no curvature, ROM normal, no CVA tenderness  Lungs:     Clear to auscultation bilaterally, respirations unlabored  Chest wall:    No tenderness or deformity  Heart:    Regular rate and rhythm, S1 and S2 normal, no murmur, rub   or gallop  Abdomen:     Soft, non-tender, bowel sounds active all four quadrants,    no masses, no organomegaly  Genitalia:    Normal male without lesion, masses,discharge or tenderness  Rectal:    Deferred due to young age  Extremities:   Extremities normal, atraumatic, no cyanosis or  edema  Pulses:   2+ and symmetric all extremities  Skin:   Skin color, texture, turgor normal, no rashes or lesions  Lymph nodes:   Cervical, supraclavicular, and axillary nodes normal  Neurologic:   CNII-XII intact. Normal strength, sensation and reflexes      throughout          Assessment & Plan:

## 2014-12-03 NOTE — Addendum Note (Signed)
Addended by: Noreene LarssonLARSON, Leonetta Mcgivern A on: 12/03/2014 02:47 PM   Modules accepted: Orders

## 2014-12-03 NOTE — Progress Notes (Signed)
Pre visit review using our clinic review tool, if applicable. No additional management support is needed unless otherwise documented below in the visit note. 

## 2014-12-03 NOTE — Addendum Note (Signed)
Addended by: Verdie ShireBAYNES, ANGELA M on: 12/03/2014 03:28 PM   Modules accepted: Orders

## 2014-12-03 NOTE — Patient Instructions (Signed)
Follow up in 1 year or as needed We'll notify you of your lab results and make any changes if needed Keep up the good work on healthy diet and regular exercise!  I'm SO proud of you! Call with any questions or concerns [place]!

## 2014-12-07 ENCOUNTER — Other Ambulatory Visit: Payer: BLUE CROSS/BLUE SHIELD

## 2015-08-16 ENCOUNTER — Telehealth: Payer: Self-pay | Admitting: Family Medicine

## 2015-08-16 ENCOUNTER — Encounter (HOSPITAL_COMMUNITY): Payer: Self-pay

## 2015-08-16 ENCOUNTER — Emergency Department (HOSPITAL_COMMUNITY): Payer: BLUE CROSS/BLUE SHIELD

## 2015-08-16 ENCOUNTER — Emergency Department (HOSPITAL_COMMUNITY)
Admission: EM | Admit: 2015-08-16 | Discharge: 2015-08-16 | Disposition: A | Discharge status: Home / Self Care | Payer: BLUE CROSS/BLUE SHIELD | Attending: Emergency Medicine | Admitting: Emergency Medicine

## 2015-08-16 DIAGNOSIS — R569 Unspecified convulsions: Secondary | ICD-10-CM

## 2015-08-16 DIAGNOSIS — M25511 Pain in right shoulder: Secondary | ICD-10-CM | POA: Insufficient documentation

## 2015-08-16 DIAGNOSIS — F1410 Cocaine abuse, uncomplicated: Secondary | ICD-10-CM | POA: Insufficient documentation

## 2015-08-16 DIAGNOSIS — Z8781 Personal history of (healed) traumatic fracture: Secondary | ICD-10-CM | POA: Diagnosis not present

## 2015-08-16 DIAGNOSIS — R7989 Other specified abnormal findings of blood chemistry: Secondary | ICD-10-CM | POA: Diagnosis not present

## 2015-08-16 DIAGNOSIS — Z86711 Personal history of pulmonary embolism: Secondary | ICD-10-CM | POA: Insufficient documentation

## 2015-08-16 LAB — COMPREHENSIVE METABOLIC PANEL
ALT: 15 U/L — AB (ref 17–63)
AST: 26 U/L (ref 15–41)
Albumin: 3.1 g/dL — ABNORMAL LOW (ref 3.5–5.0)
Alkaline Phosphatase: 61 U/L (ref 38–126)
Anion gap: 11 (ref 5–15)
BUN: 8 mg/dL (ref 6–20)
CALCIUM: 8.8 mg/dL — AB (ref 8.9–10.3)
CHLORIDE: 108 mmol/L (ref 101–111)
CO2: 22 mmol/L (ref 22–32)
CREATININE: 1.3 mg/dL — AB (ref 0.61–1.24)
GFR calc non Af Amer: >60 mL/min (ref >60)
Glucose, Bld: 77 mg/dL (ref 65–99)
Potassium: 4.4 mmol/L (ref 3.5–5.1)
Sodium: 141 mmol/L (ref 135–145)
TOTAL PROTEIN: 5.8 g/dL — AB (ref 6.5–8.1)
Total Bilirubin: 1 mg/dL (ref 0.3–1.2)

## 2015-08-16 LAB — CBC WITH DIFFERENTIAL/PLATELET
BASOS PCT: 0 %
Basophils Absolute: 0 10*3/uL (ref 0.0–0.1)
EOS PCT: 2 %
Eosinophils Absolute: 0.2 10*3/uL (ref 0.0–0.7)
HEMATOCRIT: 44.7 % (ref 39.0–52.0)
Hemoglobin: 14.8 g/dL (ref 13.0–17.0)
Lymphocytes Relative: 15 %
Lymphs Abs: 1.8 10*3/uL (ref 0.7–4.0)
MCH: 30.2 pg (ref 26.0–34.0)
MCHC: 33.1 g/dL (ref 30.0–36.0)
MCV: 91.2 fL (ref 78.0–100.0)
MONO ABS: 1 10*3/uL (ref 0.1–1.0)
Monocytes Relative: 8 %
Neutro Abs: 9.1 10*3/uL — ABNORMAL HIGH (ref 1.7–7.7)
Neutrophils Relative %: 75 %
PLATELETS: 170 10*3/uL (ref 150–400)
RBC: 4.9 MIL/uL (ref 4.22–5.81)
RDW: 13.4 % (ref 11.5–15.5)
WBC: 12 10*3/uL — ABNORMAL HIGH (ref 4.0–10.5)

## 2015-08-16 LAB — RAPID URINE DRUG SCREEN, HOSP PERFORMED
Amphetamines: NOT DETECTED
Barbiturates: NOT DETECTED
Benzodiazepines: NOT DETECTED
Cocaine: POSITIVE — AB
OPIATES: NOT DETECTED
Tetrahydrocannabinol: NOT DETECTED

## 2015-08-16 LAB — ETHANOL: Alcohol, Ethyl (B): <5 mg/dL (ref <5)
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
## 2015-08-16 IMAGING — CT CT HEAD W/O CM
2 series · 15 of 30 positions shown, 17 images · non-contrast
Comparison: Head CT [DATE].

CLINICAL DATA: 44-year-old male with new seizure activity. Initial
encounter.

EXAM:
CT HEAD WITHOUT CONTRAST
TECHNIQUE: Contiguous axial images were obtained from the base of the skull
through the vertex without intravenous contrast.

[Series 2: head bone · axial · 0.45mm/px · z∈[-183,-51]mm · 8 of 84 slices shown]
[im 9/84  bone]
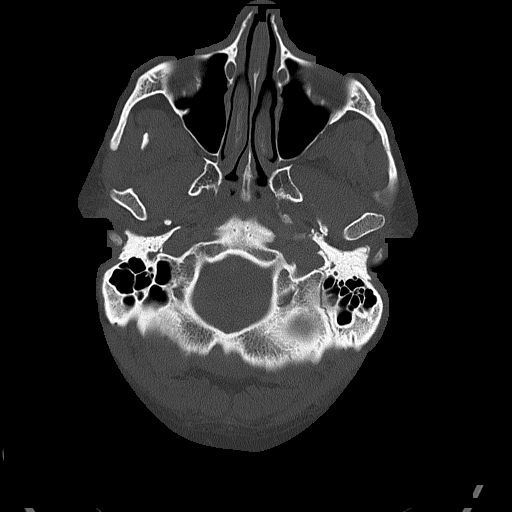
[im 17/84  bone]
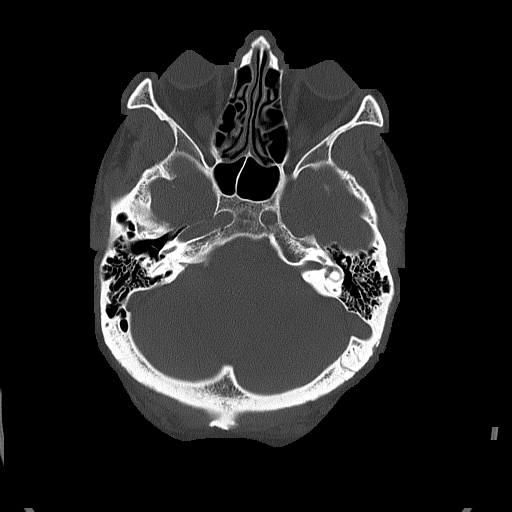
[im 25/84  bone]
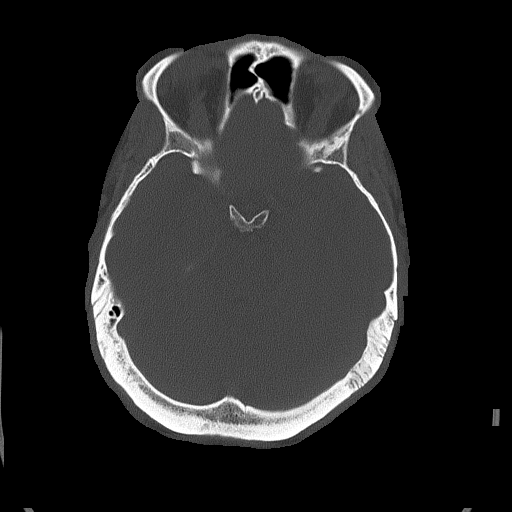
[im 38/84  bone]
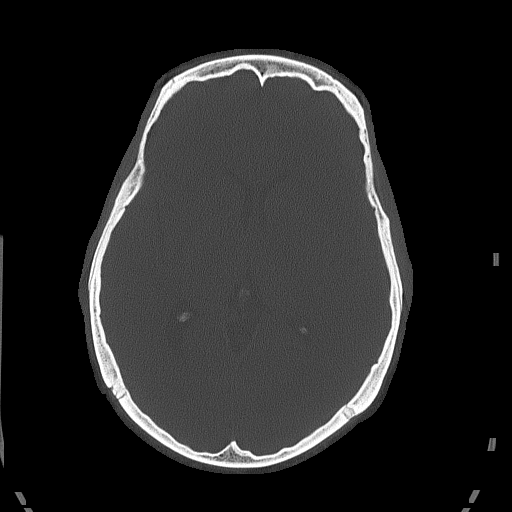
[im 46/84  bone]
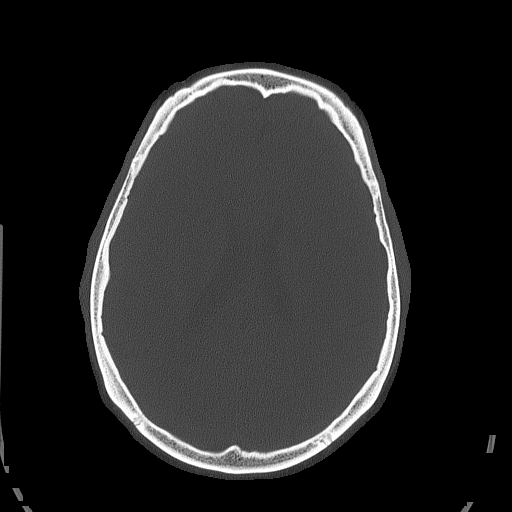
[im 59/84  bone]
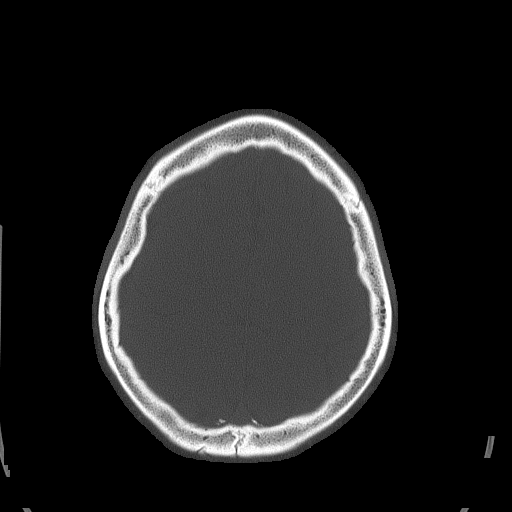
[im 67/84  bone]
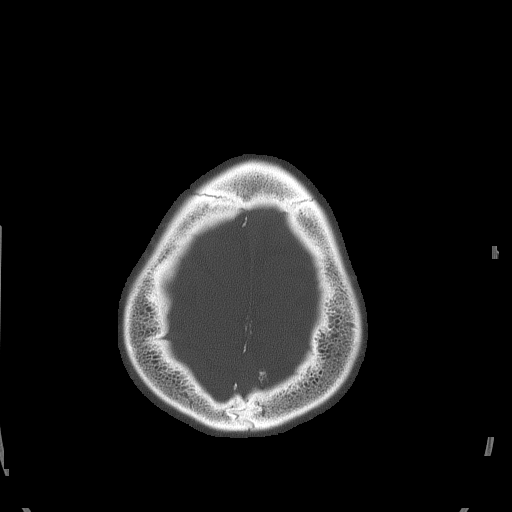
[im 75/84  bone]
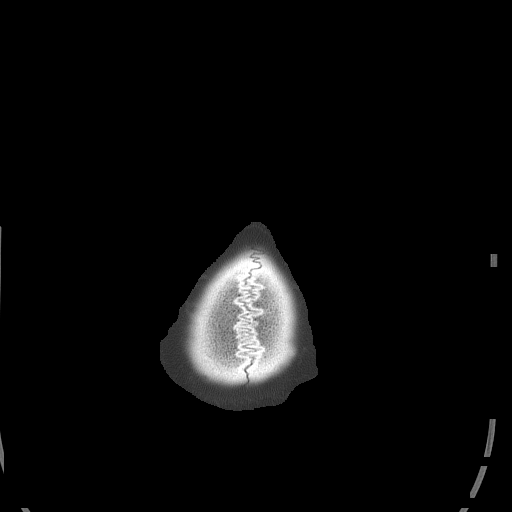

[Series 3: head without · axial · non-contrast · 0.45mm/px · z∈[-179,-59]mm · 7 of 34 slices shown, 9 images]
[im 5/34  brain]
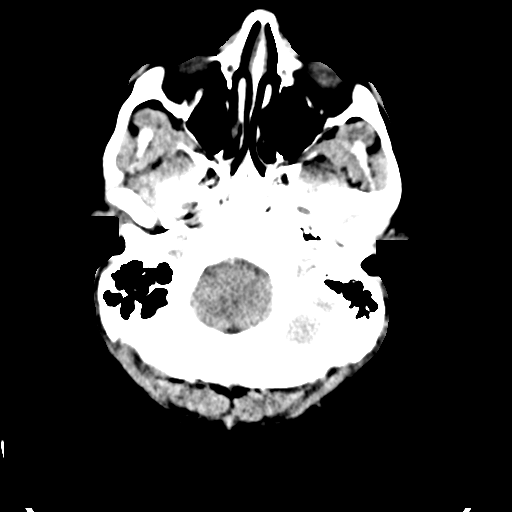
[im 5/34  bone]
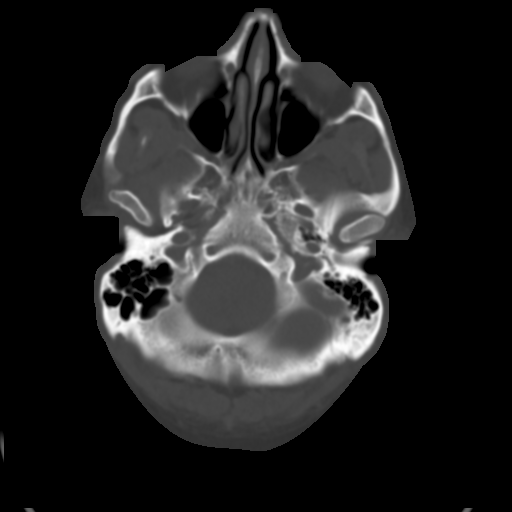
[im 9/34  brain]
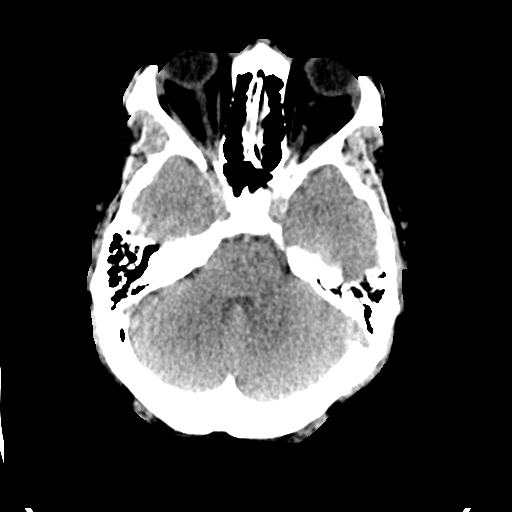
[im 13/34  brain]
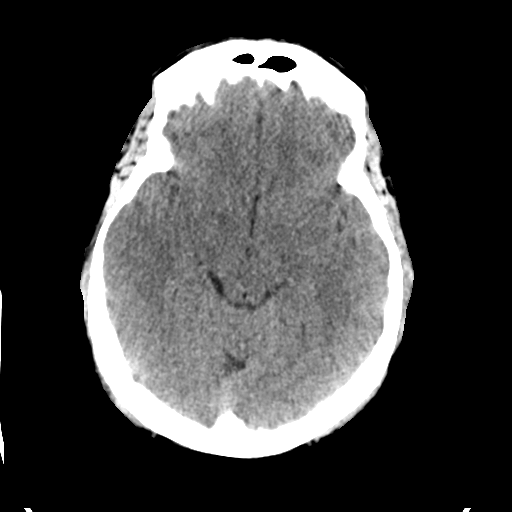
[im 17/34  brain]
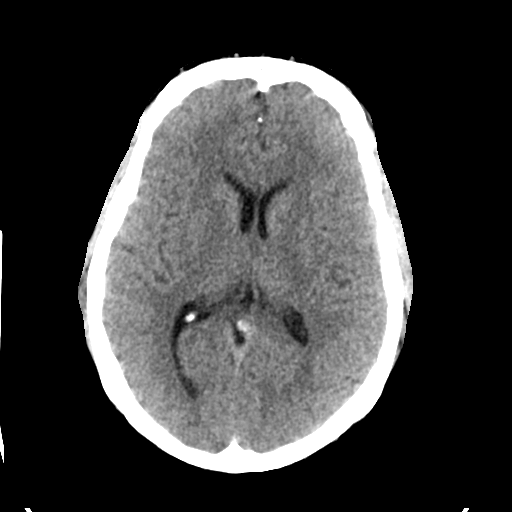
[im 21/34  brain]
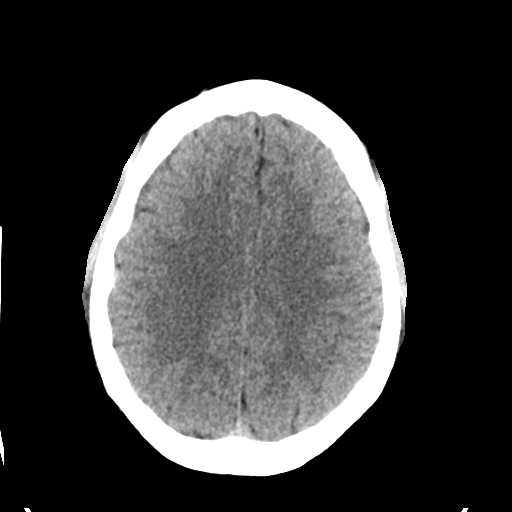
[im 21/34  bone]
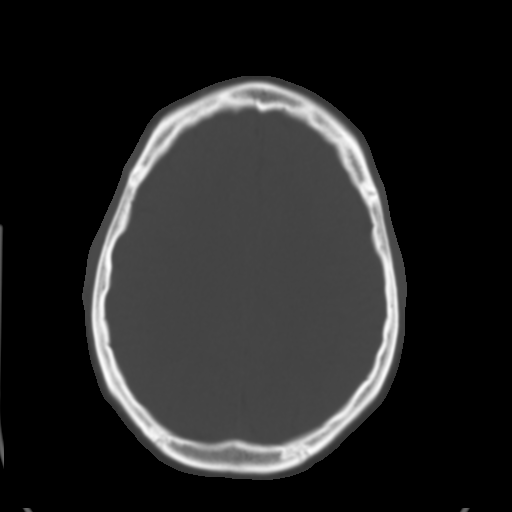
[im 25/34  brain]
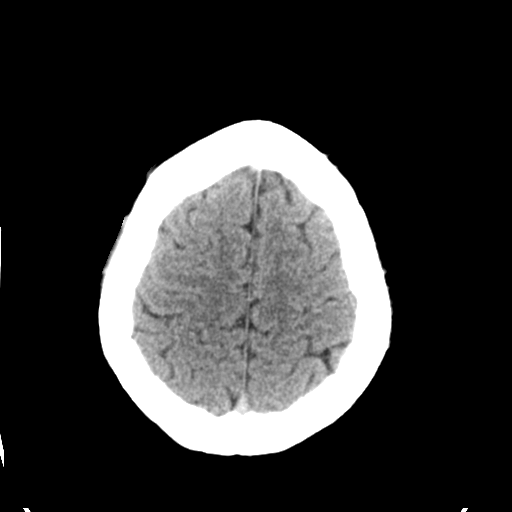
[im 29/34  brain]
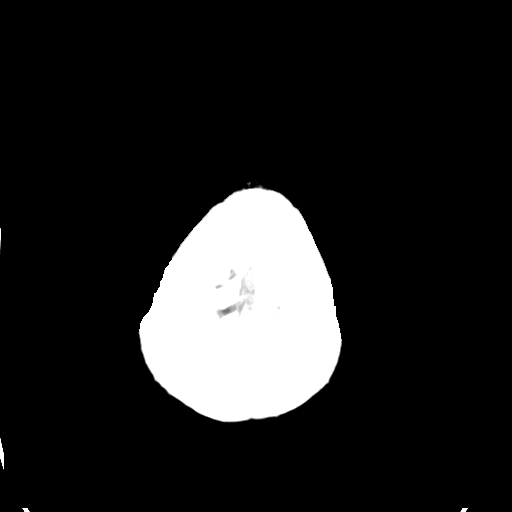

[15 of 30 positions shown; findings below may reference images not displayed]

FINDINGS: Visualized paranasal sinuses and mastoids are clear. No acute
osseous abnormality identified. No acute scalp or orbits soft tissue
findings.

Cerebral volume is normal. No midline shift, ventriculomegaly, mass
effect, evidence of mass lesion, intracranial hemorrhage or evidence
of cortically based acute infarction. Gray-white matter
differentiation is within normal limits throughout the brain. No
suspicious intracranial vascular hyperdensity.
IMPRESSION: Stable and normal noncontrast CT appearance of the brain.

## 2015-08-16 MED ORDER — ACETAMINOPHEN 500 MG PO TABS: 500.0000 mg | ORAL_TABLET | Freq: Four times a day (QID) | ORAL | Status: DC | PRN

## 2015-08-16 NOTE — ED Notes (Signed)
Patient remains in radiology
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
## 2015-08-16 NOTE — Discharge Instructions (Signed)
Please do not drive until you follow up with a neurologist. Please have your primary care provider recheck her creatinine. It was slightly elevated today at 1.30. Seizure, Adult A seizure is abnormal electrical activity in the brain. Seizures usually last from 30 seconds to 2 minutes. There are various types of seizures. Before a seizure, you may have a warning sensation (aura) that a seizure is about to occur. An aura may include the following symptoms:   Fear or anxiety.  Nausea.  Feeling like the room is spinning (vertigo).  Vision changes, such as seeing flashing lights or spots. Common symptoms during a seizure include:  A change in attention or behavior (altered mental status).  Convulsions with rhythmic jerking movements.  Drooling.  Rapid eye movements.  Grunting.  Loss of bladder and bowel control.  Bitter taste in the mouth.  Tongue biting. After a seizure, you may feel confused and sleepy. You may also have an injury resulting from convulsions during the seizure. HOME CARE INSTRUCTIONS   If you are given medicines, take them exactly as prescribed by your health care provider.  Keep all follow-up appointments as directed by your health care provider.  Do not swim or drive or engage in risky activity during which a seizure could cause further injury to you or others until your health care provider says it is OK.  Get adequate rest.  Teach friends and family what to do if you have a seizure. They should:  Lay you on the ground to prevent a fall.  Put a cushion under your head.  Loosen any tight clothing around your neck.  Turn you on your side. If vomiting occurs, this helps keep your airway clear.  Stay with you until you recover.  Know whether or not you need emergency care. SEEK IMMEDIATE MEDICAL CARE IF:  The seizure lasts longer than 5 minutes.  The seizure is severe or you do not wake up immediately after the seizure.  You have an altered  mental status after the seizure.  You are having more frequent or worsening seizures. Someone should drive you to the emergency department or call local emergency services (911 in [place] MAKE SURE YOU:  Understand these instructions.  Will watch your condition.  Will get help right away if you are not doing well or get worse.   This information is not intended to replace advice given to you by your health care provider. Make sure you discuss any questions you have with your health care provider.   Document Released: 09/21/2000 Document Revised: 10/15/2014 Document Reviewed: 05/06/2013 Elsevier Interactive Patient Education 2016 [employer] Shoulder Pain The shoulder is the joint that connects your arms to your body. The bones that form the shoulder joint include the upper arm bone (humerus), the shoulder blade (scapula), and the collarbone (clavicle). The top of the humerus is shaped like a ball and fits into a rather flat socket on the scapula (glenoid cavity). A combination of muscles and strong, fibrous tissues that connect muscles to bones (tendons) support your shoulder joint and hold the ball in the socket. Small, fluid-filled sacs (bursae) are located in different areas of the joint. They act as cushions between the bones and the overlying soft tissues and help reduce friction between the gliding tendons and the bone as you move your arm. Your shoulder joint allows a wide range of motion in your arm. This range of motion allows you to do things like scratch your back or throw a ball. However,  this range of motion also makes your shoulder more prone to pain from overuse and injury. Causes of shoulder pain can originate from both injury and overuse and usually can be grouped in the following four categories:  Redness, swelling, and pain (inflammation) of the tendon (tendinitis) or the bursae (bursitis).  Instability, such as a dislocation of the joint.  Inflammation of the joint  (arthritis).  Broken bone (fracture). HOME CARE INSTRUCTIONS   Apply ice to the sore area.  Put ice in a plastic bag.  Place a towel between your skin and the bag.  Leave the ice on for 15-20 minutes, 3-4 times per day for the first 2 days, or as directed by your health care provider.  Stop using cold packs if they do not help with the pain.  If you have a shoulder sling or immobilizer, wear it as long as your caregiver instructs. Only remove it to shower or bathe. Move your arm as little as possible, but keep your hand moving to prevent swelling.  Squeeze a soft ball or foam pad as much as possible to help prevent swelling.  Only take over-the-counter or prescription medicines for pain, discomfort, or fever as directed by your caregiver. SEEK MEDICAL CARE IF:   Your shoulder pain increases, or new pain develops in your arm, hand, or fingers.  Your hand or fingers become cold and numb.  Your pain is not relieved with medicines. SEEK IMMEDIATE MEDICAL CARE IF:   Your arm, hand, or fingers are numb or tingling.  Your arm, hand, or fingers are significantly swollen or turn white or blue. MAKE SURE YOU:   Understand these instructions.  Will watch your condition.  Will get help right away if you are not doing well or get worse.   This information is not intended to replace advice given to you by your health care provider. Make sure you discuss any questions you have with your health care provider.   Document Released: 07/04/2005 Document Revised: 10/15/2014 Document Reviewed: 01/17/2015 Elsevier Interactive Patient Education [employer]

## 2015-08-16 NOTE — Telephone Encounter (Signed)
Noted.  Called to follow up.  Wife states they are currently in the ER and they are calling them to the back now.  Message routed to PCP for FYI.

## 2015-08-16 NOTE — Telephone Encounter (Signed)
Patient Name: Caron Guinea-BissauFRANCE DOB: December 08, 1970 Initial Comment Caller states husband had a seizure, never had one before, can't use right arm Nurse Assessment Nurse: Charna Elizabethrumbull, RN, Lynden Angathy Date/Time (Eastern Time): 08/16/2015 9:28:02 AM Confirm and document reason for call. If symptomatic, describe symptoms. ---Caller states her husband had a seizure yesterday for the first time. No breathing difficulty. No injury in the past 3 days. Has the patient traveled out of the country within the last 30 days? ---No Does the patient have any new or worsening symptoms? ---Yes Will a triage be completed? ---Yes Related visit to physician within the last 2 weeks? ---No Does the PT have any chronic conditions? (i.e. diabetes, asthma, etc.) ---No Guidelines Guideline Title Affirmed Question Affirmed Notes Seizure First seizure ever Final Disposition User Call EMS 911 Now Woodruffrumbull, RN, Lynden AngCathy Comments They declined the Call 911 disposition and plan to drive him to ER now. [facility] Disagree/Comply: Disagree Disagree/Comply Reason: Disagree with instructions

## 2015-08-16 NOTE — ED Provider Notes (Signed)
CSN: [id number]     Arrival date & time 08/16/15  [id number] History   First MD Initiated Contact with Patient 08/16/15 1022     Chief Complaint  Patient presents with  . Seizures   Cameron Guinea-BissauFrance is a 44 y.o. male with history of a pulmonary embolism who presents to the emergency department after he had a seizure yesterday around 12 PM. He denies history of seizures. The patient reports he was sitting on a sofa watching TV when his friends report that he had a seizure while sitting on the couch. He is unsure exactly how long this SZ lasted for or what part of his body was shaking during this time. He denies history of seizures. He denies falling during the seizure. He reports this occurred around 12 PM yesterday, or almost 24 hours prior to arrival. Patient has been complaining of right shoulder pain that he rates at a 3 out of 10 since the seizure. He denies trauma or injury to his right shoulder. He reports it hurts to move his shoulder in his shoulder joint. The patient denies alcohol use. He denies head injury or head trauma. The patient denies fevers, chills, fall, trauma, chest pain, shortness of breath, headache, double vision, vision changes, neck pain, back pain, numbness, tingling, weakness, or head injury.  (Consider location/radiation/quality/duration/timing/severity/associated sxs/prior Treatment) HPI  Past Medical History  Diagnosis Date  . Pulmonary embolism (HCC)   . Ankle fracture, left   . Abnormal EKG    History reviewed. No pertinent past surgical history. Family History  Problem Relation Age of Onset  . Diabetes Mother   . Hypertension Mother   . Heart murmur Mother   . Diabetes Father   . Hypertension Father   . Diabetes Paternal Grandmother   . Diabetes Paternal Grandfather    Social History  Substance Use Topics  . Smoking status: Never Smoker   . Smokeless tobacco: None  . Alcohol Use: No    Review of Systems  Constitutional: Negative for fever and chills.   HENT: Negative for congestion and sore throat.   Eyes: Negative for photophobia, pain and visual disturbance.  Respiratory: Negative for cough and shortness of breath.   Cardiovascular: Negative for chest pain.  Gastrointestinal: Negative for nausea, vomiting, abdominal pain and diarrhea.  Genitourinary: Negative for dysuria and difficulty urinating.  Musculoskeletal: Positive for arthralgias. Negative for back pain and neck pain.  Skin: Negative for rash.  Neurological: Positive for seizures. Negative for dizziness, syncope, facial asymmetry, weakness, light-headedness, numbness and headaches.      Allergies  Review of patient's allergies indicates no known allergies.  Home Medications   Prior to Admission medications   Medication Sig Start Date End Date Taking? Authorizing Provider  acetaminophen (TYLENOL) 500 MG tablet Take 1 tablet (500 mg total) by mouth every 6 (six) hours as needed for moderate pain. 08/16/15   Everlene FarrierWilliam Pandora Mccrackin, PA-C  ibuprofen (ADVIL,MOTRIN) 600 MG tablet Take 1 tablet (600 mg total) by mouth every 6 (six) hours as needed. Patient not taking: Reported on 12/03/2014 05/20/14   Derwood KaplanAnkit Nanavati, MD   BP 126/85 mmHg  Pulse 90  Temp(Src) 98.5 F (36.9 C) (Oral)  Resp 22  Ht 6\' 2"  (1.88 m)  Wt 265 lb (120.203 kg)  BMI 34.01 kg/m2  SpO2 99% Physical Exam  Constitutional: He is oriented to person, place, and time. He appears well-developed and well-nourished. No distress.   Nontoxic appearing.  HENT:  Head: Normocephalic and atraumatic.  Right Ear: External  ear normal.  Left Ear: External ear normal.  Nose: Nose normal.  Mouth/Throat: Oropharynx is clear and moist. No oropharyngeal exudate.  No visible signs of head trauma.  Eyes: Conjunctivae and EOM are normal. Pupils are equal, round, and reactive to light. Right eye exhibits no discharge. Left eye exhibits no discharge.  Neck: Normal range of motion. Neck supple.  Cardiovascular: Normal rate, regular  rhythm, normal heart sounds and intact distal pulses.  Exam reveals no gallop and no friction rub.   No murmur heard. Bilateral radial pulses are intact. Good capillary refill to his right distal fingertips.  Pulmonary/Chest: Effort normal and breath sounds normal. No respiratory distress. He has no wheezes. He has no rales. He exhibits no tenderness.  Lungs clear to auscultation bilaterally. No chest wall tenderness.  Abdominal: Soft. Bowel sounds are normal. He exhibits no distension. There is no tenderness. There is no guarding.  Abdomen is soft and nontender to palpation.  Musculoskeletal: He exhibits no edema or tenderness.  No midline neck or back tenderness. The patient has 5 out of 5 strength in his bilateral upper and lower extremities. Patient reports pain with abduction of his right shoulder. No shoulder edema, deformity or ecchymosis noted. Patient is resisting range of motion exercises with his muscles well examining him. His good strength. Good and equal grip strength bilaterally.  Lymphadenopathy:    He has no cervical adenopathy.  Neurological: He is alert and oriented to person, place, and time. No cranial nerve deficit. Coordination normal.  The patient is alert and oriented 3. Cranial nerves are intact. Sensation intact in his bilateral upper and lower extremities. Good equal grip since bilaterally. Speech is clear and coherent.  Skin: Skin is warm and dry. No rash noted. He is not diaphoretic. No erythema. No pallor.  Psychiatric: He has a normal mood and affect. His behavior is normal.  Nursing note and vitals reviewed.   ED Course  Procedures (including critical care time) Labs Review Labs Reviewed  COMPREHENSIVE METABOLIC PANEL - Abnormal; Notable for the following:    Creatinine, Ser 1.30 (*)    Calcium 8.8 (*)    Total Protein 5.8 (*)    Albumin 3.1 (*)    ALT 15 (*)    All other components within normal limits  URINE RAPID DRUG SCREEN, HOSP PERFORMED -  Abnormal; Notable for the following:    Cocaine POSITIVE (*)    All other components within normal limits  CBC WITH DIFFERENTIAL/PLATELET - Abnormal; Notable for the following:    WBC 12.0 (*)    Neutro Abs 9.1 (*)    All other components within normal limits  ETHANOL    Imaging Review Dg Shoulder Right  08/16/2015  CLINICAL DATA:  44 year old male status witnessed seizure last night, right shoulder pain. Initial encounter. EXAM: RIGHT SHOULDER - 2+ VIEW COMPARISON:  None. FINDINGS: No glenohumeral joint dislocation. The proximal right humerus appears intact but there is subchondral irregularity of the humeral head. This has a chronic appearance. Visible right clavicle and scapula appear intact. Visible right ribs intact. IMPRESSION: No acute fracture or dislocation identified. 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 Electronically Signed   By: Odessa Fleming M.D.   On: 08/16/2015 11:48   Ct Head Wo Contrast  08/16/2015  CLINICAL DATA:  44 year old male with new seizure activity. Initial encounter. EXAM: CT HEAD WITHOUT CONTRAST TECHNIQUE: Contiguous axial images were obtained from the base of the skull through the vertex without intravenous contrast. COMPARISON:  Head CT 02/19/2014. FINDINGS: Visualized paranasal sinuses and mastoids are clear. No acute osseous abnormality identified. No acute scalp or orbits soft tissue findings. Cerebral volume is normal. No midline shift, ventriculomegaly, mass effect, evidence of mass lesion, intracranial hemorrhage or evidence of cortically based acute infarction. Gray-white matter differentiation is within normal limits throughout the brain. No suspicious intracranial vascular hyperdensity. IMPRESSION: Stable and normal noncontrast CT appearance of the brain. Electronically Signed   By: Odessa Fleming M.D.   On: 08/16/2015 11:36   I have personally reviewed and evaluated these images and lab results as part of my medical decision-making.   EKG  Interpretation None      Filed Vitals:   08/16/15 1009 08/16/15 1230 08/16/15 1413  BP: 135/77 129/79 126/85  Pulse: 78 83 90  Temp: 98.5 F (36.9 C)  98.5 F (36.9 C)  TempSrc:   Oral  Resp: 18 19 22   Height: 6\' 2"  (1.88 m)    Weight: 265 lb (120.203 kg)    SpO2: 98% 97% 99%     MDM   Meds given in ED:  Medications - No data to display  Discharge Medication List as of 08/16/2015  2:14 PM    START taking these medications   Details  acetaminophen (TYLENOL) 500 MG tablet Take 1 tablet (500 mg total) by mouth every 6 (six) hours as needed for moderate pain., Starting 08/16/2015, Until Discontinued, Print        Final diagnoses:  Seizure (HCC)  Right shoulder pain  Elevated serum creatinine   This is a 44 y.o. male with history of a pulmonary embolism who presents to the emergency department after he had a seizure yesterday around 12 PM. He denies history of seizures. The patient reports he was sitting on a sofa watching TV when his friends report that he had a seizure while sitting on the couch. He is unsure exactly how long this SZ lasted for or what part of his body was shaking during this time. He denies history of seizures. He denies falling during the seizure. He reports this occurred around 12 PM yesterday, or almost 24 hours prior to arrival. Patient has been complaining of right shoulder pain that he rates at a 3 out of 10 since the seizure. He denies trauma or injury to his right shoulder. He reports it hurts to move his shoulder in his shoulder joint. The patient denies alcohol use. He denies head injury or head trauma. On examination patient is afebrile nontoxic appearing. He has no focal neurological deficits. He has decreased active range of motion of his right shoulder but normal passive range of motion. Patient reports pain in the shoulder joint.  Patient's CMP indicates a creatinine of 1.30. This is mildly elevated and around his previous blood work from 1 year ago  where it was 1.20. CBC is remarkable only for leukocytosis of 12,000. Urine drug screen is positive for cocaine. Negative alcohol level. Right shoulder x-ray indicates no acute fracture dislocation. It does indicate chronic degeneration of the right humeral head. CT head without contrast is unremarkable. Patient said no seizure activity during his stay in the emergency department. I advised him to not use cocaine. I encouraged him to use Tylenol as needed for pain of his shoulder. I encouraged him to follow-up with his primary care to have his creatinine rechecked. I also advised needs to follow-up with the neurologist this week and he is not allowed to drive until he is cleared by a neurologist.  I advised the patient to follow-up with their primary care provider this week. I advised the patient to return to the emergency department with new or worsening symptoms or new concerns. The patient verbalized understanding and agreement with plan.    This patient was discussed with Dr. Fredderick Phenix who agrees with assessment and plan.    Everlene Farrier, PA-C 08/16/15 1534  Rolan Bucco, MD 08/16/15 1626

## 2015-12-05 ENCOUNTER — Encounter: Payer: Self-pay | Admitting: Behavioral Health

## 2015-12-05 ENCOUNTER — Telehealth: Payer: Self-pay | Admitting: Behavioral Health

## 2015-12-05 NOTE — Telephone Encounter (Signed)
Pre-Visit Call completed with patient and chart updated.   Pre-Visit Info documented in Specialty Comments under [employer].    

## 2015-12-06 ENCOUNTER — Encounter: Payer: Self-pay | Admitting: Family Medicine

## 2015-12-06 ENCOUNTER — Ambulatory Visit (INDEPENDENT_AMBULATORY_CARE_PROVIDER_SITE_OTHER): Payer: BLUE CROSS/BLUE SHIELD | Admitting: Family Medicine

## 2015-12-06 VITALS — BP 132/84 | HR 81 | Temp 98.2°F | Ht 75.0 in | Wt 277.2 lb

## 2015-12-06 DIAGNOSIS — R569 Unspecified convulsions: Secondary | ICD-10-CM

## 2015-12-06 DIAGNOSIS — E669 Obesity, unspecified: Secondary | ICD-10-CM | POA: Diagnosis not present

## 2015-12-06 DIAGNOSIS — Z0000 Encounter for general adult medical examination without abnormal findings: Secondary | ICD-10-CM

## 2015-12-06 NOTE — Assessment & Plan Note (Signed)
Deteriorated.  Pt has gained 12 lbs since November.  Check labs to risk stratify- including A1C.  Stressed need for healthy diet and regular exercise.  Will follow.

## 2015-12-06 NOTE — Assessment & Plan Note (Signed)
Pt's PE WNL w/ exception of obesity.  Check labs.  Anticipatory guidance provided.  

## 2015-12-06 NOTE — Progress Notes (Signed)
Pre visit review using our clinic review tool, if applicable. No additional management support is needed unless otherwise documented below in the visit note. 

## 2015-12-06 NOTE — Assessment & Plan Note (Signed)
New.  Pt was napping at friend's house in November when they told him he had seizure like activity while he was sleeping.  Pt never followed up as directed, never saw Neuro.  Pt denied substance use at today's visit despite + cocaine on UDS.  Wife was in room so did not confront him w/ this info.  Refer to neuro for complete evaluation.  Pt expressed understanding and is in agreement w/ plan.

## 2015-12-06 NOTE — Patient Instructions (Signed)
Follow up in 3-4 months to recheck weight loss progress We'll notify you of your lab results and make any changes if needed We'll contact you with your neuro appt to assess the possible seizure activity Try and work on healthy diet and regular exercise- this is very important! Call with any questions or concerns If you want to join [place] at the new [place] office, any scheduled appointments will automatically transfer and we will see you at [address] [place] [address], [place], [place] [address] [facility]) Have a great week!!!

## 2015-12-06 NOTE — Progress Notes (Signed)
   Subjective:    Patient ID: Cameron Kim, male    DOB: 05-14-1971, 46 y.o.   MRN: [id number]  HPI CPE- too young for colonoscopy.  UTD on Tdap.  Pt declines flu.   Review of Systems Patient reports no vision/hearing changes, anorexia, fever ,adenopathy, persistant/recurrent hoarseness, swallowing issues, chest pain, palpitations, edema, persistant/recurrent cough, hemoptysis, dyspnea (rest,exertional, paroxysmal nocturnal), gastrointestinal  bleeding (melena, rectal bleeding), abdominal pain, excessive heart burn, GU symptoms (dysuria, hematuria, voiding/incontinence issues) syncope, focal weakness, memory loss, numbness & tingling, skin/hair/nail changes, depression, anxiety, abnormal bruising/bleeding, musculoskeletal symptoms/signs.  ? Seizure activity in November.  Has not seen neuro.     Objective:   Physical Exam General Appearance:    Alert, cooperative, no distress, appears stated age  Head:    Normocephalic, without obvious abnormality, atraumatic  Eyes:    PERRL, conjunctiva/corneas clear, EOM's intact, fundi    benign, both eyes       Ears:    Normal TM's and external ear canals, both ears  Nose:   Nares normal, septum midline, mucosa normal, no drainage   or sinus tenderness  Throat:   Lips, mucosa, and tongue normal; teeth and gums normal  Neck:   Supple, symmetrical, trachea midline, no adenopathy;       thyroid:  No enlargement/tenderness/nodules  Back:     Symmetric, no curvature, ROM normal, no CVA tenderness  Lungs:     Clear to auscultation bilaterally, respirations unlabored  Chest wall:    No tenderness or deformity  Heart:    Regular rate and rhythm, S1 and S2 normal, no murmur, rub   or gallop  Abdomen:     Soft, non-tender, bowel sounds active all four quadrants,    no masses, no organomegaly  Genitalia:    Normal male without lesion, masses,discharge or tenderness  Rectal:    Deferred due to young age  Extremities:   Extremities normal, atraumatic, no  cyanosis or edema  Pulses:   2+ and symmetric all extremities  Skin:   Skin color, texture, turgor normal, no rashes or lesions  Lymph nodes:   Cervical, supraclavicular, and axillary nodes normal  Neurologic:   CNII-XII intact. Normal strength, sensation and reflexes      throughout          Assessment & Plan:

## 2015-12-09 ENCOUNTER — Other Ambulatory Visit: Payer: BLUE CROSS/BLUE SHIELD

## 2015-12-09 ENCOUNTER — Telehealth: Payer: Self-pay | Admitting: General Practice

## 2015-12-09 NOTE — Telephone Encounter (Signed)
-----   Message from Sheliah HatchKatherine E Tabori, MD sent at 12/09/2015  8:14 AM EST ----- Regarding: labs Please have pt return for labs (he left prior to getting them done)  KT ----- Message -----    From: SYSTEM    Sent: 12/09/2015  12:05 AM      To: Sheliah HatchKatherine E Tabori, MD

## 2015-12-09 NOTE — Telephone Encounter (Signed)
Called pt and lmovm to call [phone] to schedule a fasting lab appointment. Pt left without having CPE labs drawn, these are already ordered in the system.

## 2015-12-21 ENCOUNTER — Ambulatory Visit: Payer: BLUE CROSS/BLUE SHIELD | Admitting: Neurology

## 2015-12-21 DIAGNOSIS — Z029 Encounter for administrative examinations, unspecified: Secondary | ICD-10-CM

## 2016-03-06 ENCOUNTER — Ambulatory Visit: Payer: BLUE CROSS/BLUE SHIELD | Admitting: Family Medicine

## 2016-04-26 ENCOUNTER — Encounter: Payer: Self-pay | Admitting: Family Medicine

## 2016-04-26 ENCOUNTER — Ambulatory Visit (INDEPENDENT_AMBULATORY_CARE_PROVIDER_SITE_OTHER): Payer: BLUE CROSS/BLUE SHIELD | Admitting: Family Medicine

## 2016-04-26 VITALS — BP 140/82 | HR 90 | Temp 98.5°F | Ht 75.0 in | Wt 263.9 lb

## 2016-04-26 DIAGNOSIS — IMO0001 Reserved for inherently not codable concepts without codable children: Secondary | ICD-10-CM

## 2016-04-26 DIAGNOSIS — H66002 Acute suppurative otitis media without spontaneous rupture of ear drum, left ear: Secondary | ICD-10-CM

## 2016-04-26 DIAGNOSIS — H9202 Otalgia, left ear: Secondary | ICD-10-CM | POA: Diagnosis not present

## 2016-04-26 DIAGNOSIS — R030 Elevated blood-pressure reading, without diagnosis of hypertension: Secondary | ICD-10-CM | POA: Diagnosis not present

## 2016-04-26 MED ORDER — AMOXICILLIN 500 MG PO CAPS: 500.0000 mg | ORAL_CAPSULE | Freq: Two times a day (BID) | ORAL | Status: DC

## 2016-04-26 NOTE — Progress Notes (Signed)
Pre visit review using our clinic review tool, if applicable. No additional management support is needed unless otherwise documented below in the visit note. 

## 2016-04-26 NOTE — Progress Notes (Signed)
   HPI:  Ear pain L: -started: > 1 week ago -symptoms:nasal congestion, sore throat, cough, drainage and R ear pain and fullness -denies:fever, SOB, NVD, tooth pain -has tried: OTC cold medication -sick contacts/travel/risks: no reported flu, strep or tick exposure -Hx of: allergies  Elevated BP: -on arrival, borderline on recheck -report he gives blood frequently and it is always normal then -denies: CP, SOB, HA  ROS: See pertinent positives and negatives per HPI.  Past Medical History  Diagnosis Date  . Pulmonary embolism (HCC)   . Ankle fracture, left   . Abnormal EKG     No past surgical history on file.  Family History  Problem Relation Age of Onset  . Diabetes Mother   . Hypertension Mother   . Heart murmur Mother   . Diabetes Father   . Hypertension Father   . Diabetes Paternal Grandmother   . Diabetes Paternal Grandfather     Social History   Social History  . Marital Status: Married    Spouse Name: N/A  . Number of Children: N/A  . Years of Education: N/A   Social History Main Topics  . Smoking status: Never Smoker   . Smokeless tobacco: None  . Alcohol Use: No  . Drug Use: No  . Sexual Activity: Yes   Other Topics Concern  . None   Social History Narrative    No current outpatient prescriptions on file.  EXAM:  Filed Vitals:   04/26/16 1642  BP: 140/82  Pulse: 90  Temp: 98.5 F (36.9 C)    Body mass index is 32.99 kg/(m^2).  GENERAL: vitals reviewed and listed above, alert, oriented, appears well hydrated and in no acute distress  HEENT: atraumatic, conjunttiva clear, no obvious abnormalities on inspection of external nose and ears, normal appearance of ear canals and TMs except for R TM bulging, red, dull with purulent appearing effusion, clear nasal congestion, mild post oropharyngeal erythema with PND, no 1+tonsillar edema, no sinus TTP  NECK: no obvious masses on inspection  LUNGS: clear to auscultation bilaterally, no  wheezes, rales or rhonchi, good air movement  CV: HRRR, no peripheral edema  MS: moves all extremities without noticeable abnormality  PSYCH: pleasant and cooperative, no obvious depression or anxiety  ASSESSMENT AND PLAN:  Discussed the following assessment and plan:  Ear pain, left  Acute suppurative otitis media of left ear without spontaneous rupture of tympanic membrane, recurrence not specified  Elevated blood pressure  Tx with amox after discussion risks. BP borderline on recheck, perhaps from OTC cold medication. Follow up for BP re-eval with PCP in 2-4 weeks. Of course, we advised to return or notify a doctor immediately if symptoms worsen or persist or new concerns arise.    Patient Instructions  Follow up with your doctor in about 2 weeks to recheck your blood pressure and your ear.  Take the antibiotic as prescribed.    Kriste BasqueKIM, Otoniel Myhand R., DO

## 2016-04-26 NOTE — Addendum Note (Signed)
Addended by: Johnella MoloneyFUNDERBURK, Yesli Vanderhoff A on: 04/26/2016 05:05 PM   Modules accepted: Orders

## 2016-04-26 NOTE — Patient Instructions (Signed)
Follow up with your doctor in about 2 weeks to recheck your blood pressure and your ear.  Take the antibiotic as prescribed.

## 2016-04-30 ENCOUNTER — Ambulatory Visit: Payer: BLUE CROSS/BLUE SHIELD | Admitting: Family Medicine

## 2016-04-30 ENCOUNTER — Telehealth: Payer: Self-pay | Admitting: Family Medicine

## 2016-04-30 NOTE — Telephone Encounter (Signed)
Pt wife informed. Given the 2:15 appt, however I cannot schedule it. Can you place him on the schedule.

## 2016-04-30 NOTE — Telephone Encounter (Signed)
My concern is that pt's neck pain may be related to his ear infection last week and may not be a spasm (need to r/o meningitis).  There is no way to know this w/o seeing him.  I am sorry for any inconvenience but this is the best way to do it

## 2016-05-01 ENCOUNTER — Encounter: Payer: Self-pay | Admitting: Family Medicine

## 2016-05-01 ENCOUNTER — Ambulatory Visit (INDEPENDENT_AMBULATORY_CARE_PROVIDER_SITE_OTHER): Payer: BLUE CROSS/BLUE SHIELD | Admitting: Family Medicine

## 2016-05-01 VITALS — BP 140/90 | HR 87 | Temp 98.2°F | Ht 75.0 in | Wt 265.6 lb

## 2016-05-01 DIAGNOSIS — IMO0001 Reserved for inherently not codable concepts without codable children: Secondary | ICD-10-CM

## 2016-05-01 DIAGNOSIS — R030 Elevated blood-pressure reading, without diagnosis of hypertension: Secondary | ICD-10-CM

## 2016-05-01 DIAGNOSIS — M6248 Contracture of muscle, other site: Secondary | ICD-10-CM | POA: Diagnosis not present

## 2016-05-01 DIAGNOSIS — M62838 Other muscle spasm: Secondary | ICD-10-CM

## 2016-05-01 MED ORDER — CYCLOBENZAPRINE HCL 10 MG PO TABS: 10.0000 mg | ORAL_TABLET | Freq: Every evening | ORAL | 0 refills | Status: DC | PRN
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
## 2016-05-01 NOTE — Progress Notes (Signed)
Pre visit review using our clinic review tool, if applicable. No additional management support is needed unless otherwise documented below in the visit note. 

## 2016-05-01 NOTE — Patient Instructions (Signed)
Schedule follow up with your primary doctor in 1-2 weeks to recheck blood pressure, neck pain and L ear Upper back exercises  Heat for 15 minutes twice daily.  Muscle relaxer at night as needed.  Stop the ibuprofen and use tylenol if you need something for pain  Follow up with your doctor sooner if any worsening or concerns

## 2016-05-15 ENCOUNTER — Emergency Department (HOSPITAL_COMMUNITY): Payer: BLUE CROSS/BLUE SHIELD

## 2016-05-15 ENCOUNTER — Encounter (HOSPITAL_COMMUNITY): Payer: Self-pay | Admitting: Emergency Medicine

## 2016-05-15 ENCOUNTER — Emergency Department (HOSPITAL_COMMUNITY)
Admission: EM | Admit: 2016-05-15 | Discharge: 2016-05-15 | Disposition: A | Discharge status: Home / Self Care | Payer: BLUE CROSS/BLUE SHIELD | Attending: Emergency Medicine | Admitting: Emergency Medicine

## 2016-05-15 DIAGNOSIS — Y999 Unspecified external cause status: Secondary | ICD-10-CM | POA: Insufficient documentation

## 2016-05-15 DIAGNOSIS — Y929 Unspecified place or not applicable: Secondary | ICD-10-CM | POA: Insufficient documentation

## 2016-05-15 DIAGNOSIS — Y939 Activity, unspecified: Secondary | ICD-10-CM | POA: Insufficient documentation

## 2016-05-15 DIAGNOSIS — S61012A Laceration without foreign body of left thumb without damage to nail, initial encounter: Secondary | ICD-10-CM | POA: Insufficient documentation

## 2016-05-15 DIAGNOSIS — W208XXA Other cause of strike by thrown, projected or falling object, initial encounter: Secondary | ICD-10-CM | POA: Insufficient documentation

## 2016-05-15 DIAGNOSIS — S5011XA Contusion of right forearm, initial encounter: Secondary | ICD-10-CM

## 2016-05-15 DIAGNOSIS — S51831A Puncture wound without foreign body of right forearm, initial encounter: Secondary | ICD-10-CM | POA: Diagnosis present

## 2016-05-15 IMAGING — DX DG FOREARM 2V*R*
2 series · 2 of 2 positions shown · non-contrast
Comparison: None.

CLINICAL DATA: Patient was replacing a window when it fell on him,
causing a puncture wound to the right forearm and laceration to the
left thumb. Lat distal rt forearm

EXAM:
RIGHT FOREARM - 2 VIEW

[x forearm ap right]
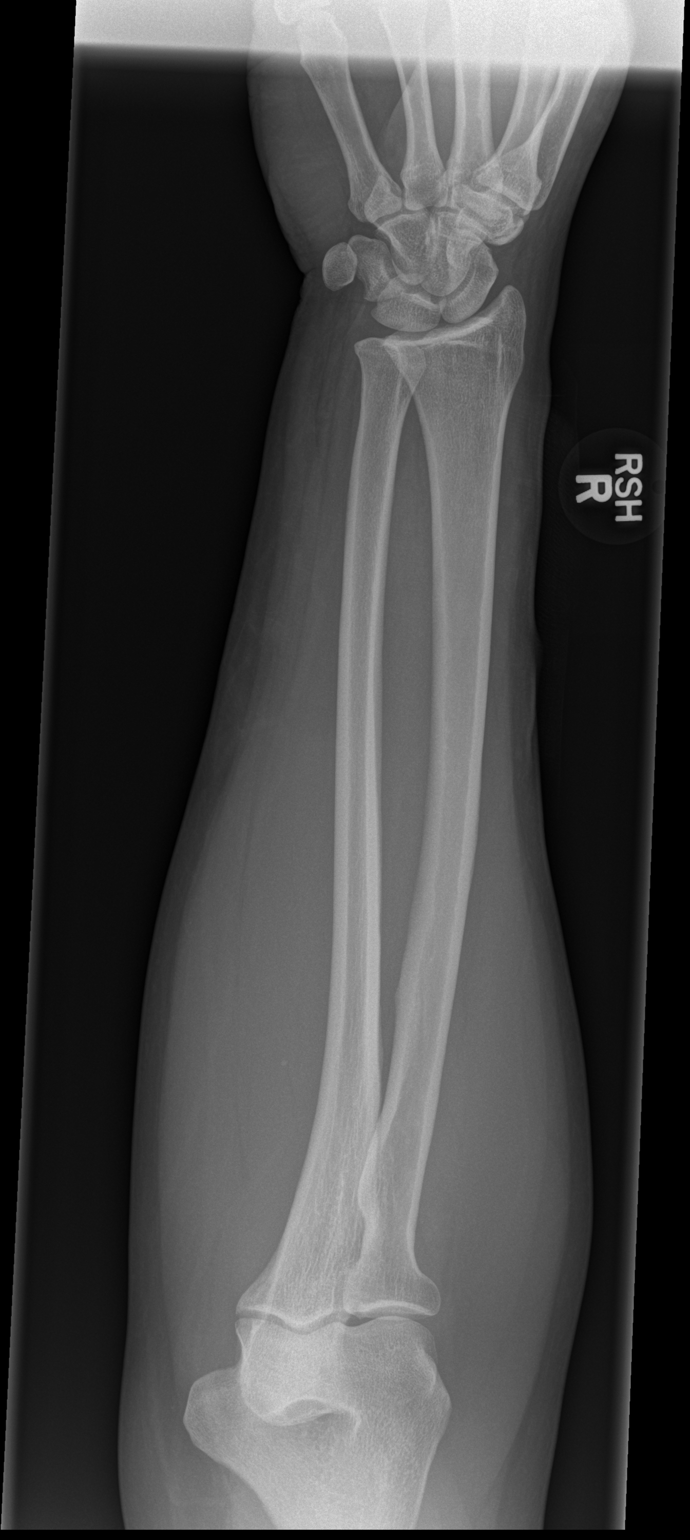

[x forearm lat right]
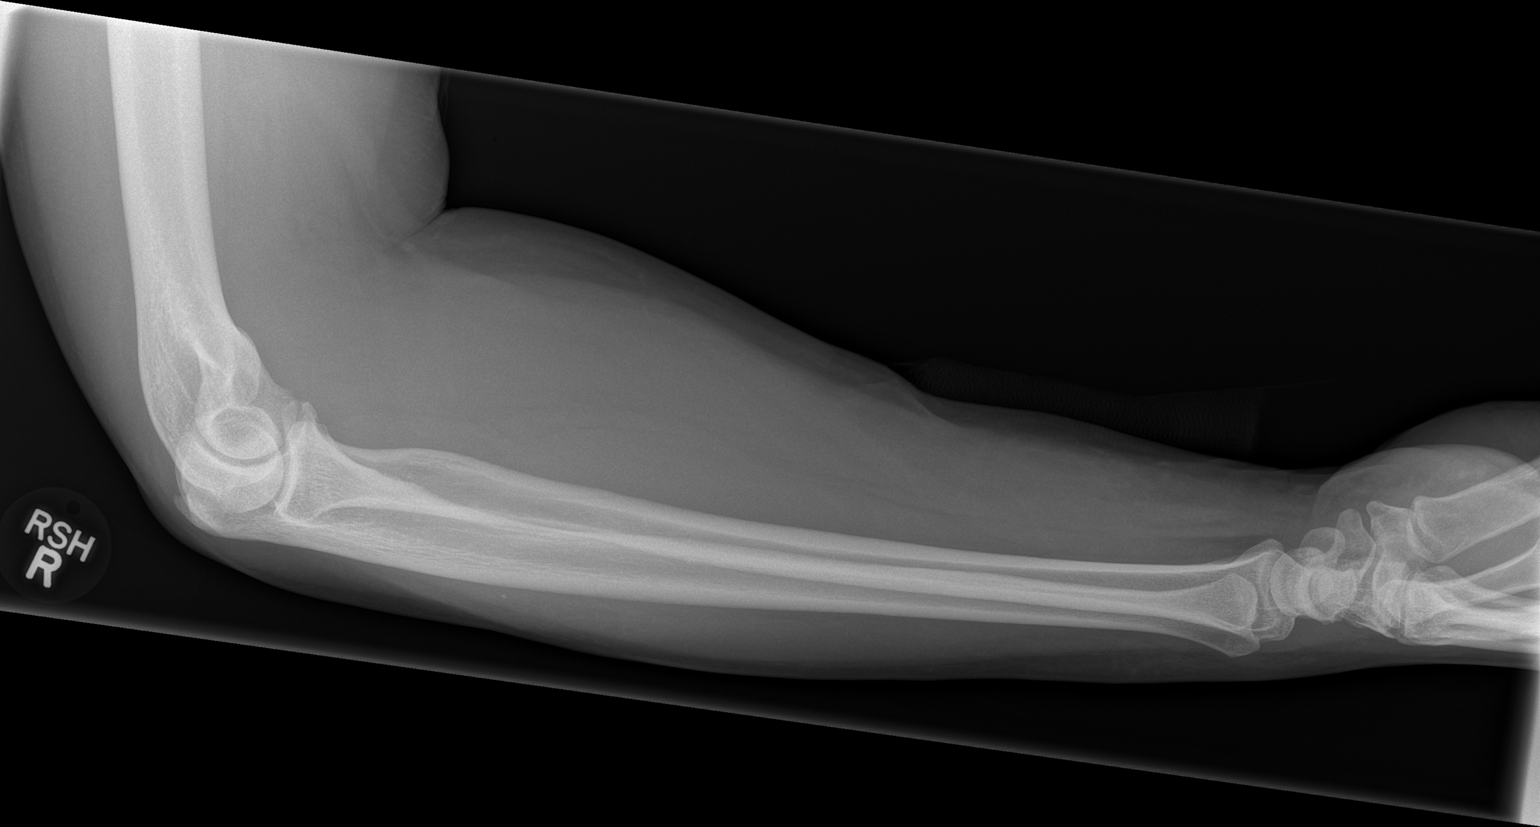

[2 of 2 positions shown; findings below may reference images not displayed]

FINDINGS: No fracture.  No bone lesion.

Elbow and wrist joints are normally spaced and aligned.

No radiopaque foreign body.
IMPRESSION: No fracture, dislocation or radiopaque foreign body.

## 2016-05-15 MED ORDER — TRAMADOL HCL 50 MG PO TABS: 50.0000 mg | ORAL_TABLET | Freq: Four times a day (QID) | ORAL | 0 refills | Status: DC | PRN

## 2016-05-15 MED ORDER — IBUPROFEN 400 MG PO TABS
800.0000 mg | ORAL_TABLET | Freq: Once | ORAL | Status: AC
  Administered 2016-05-15: 800 mg via ORAL
  Filled 2016-05-15: qty 2

## 2016-05-15 NOTE — ED Notes (Signed)
Walked in to find pt pale, diaphoretic. Head lowered and pt recovered quickly.

## 2016-05-15 NOTE — ED Notes (Signed)
Departure documentation below done in error.

## 2016-05-15 NOTE — ED Provider Notes (Signed)
[facility]-EMERGENCY DEPT Provider Note   CSN: [id number] Arrival date & time: 05/15/16  1224  By signing my name below, I, Cameron Kim and Cameron Kim, attest that this documentation has been prepared under the direction and in the presence of [name], PA-C. Electronically Signed: Sonum Kim, Neurosurgeoncribe. 05/15/16. 2:04 PM.   History   Chief Complaint No chief complaint on file.  The history is provided by the patient. No language interpreter was used.    HPI Comments: Cameron Kim is a 45 y.o. male with PMHx of PE, who presents to the Emergency Department complaining of a gradually worsening, puncture wound to the right forearm and laceration to the left thumb that occurred PTA. Pt reports he was replacing a window this afternoon when it fell on him, causing a puncture wound to the right forearm and laceration to the left thumb. He notes associated numbness and a "tingling" sensation in his left thumb. He reports washing the affected area with water and wrapping it in a towel. He notes his last tetanus immunizations was within the past five years.   Past Medical History:  Diagnosis Date  . Abnormal EKG   . Ankle fracture, left   . Pulmonary embolism [facility](HCC)    Patient Active Problem List   Diagnosis Date Noted  . Seizure-like activity (HCC) 12/06/2015  . Physical exam 12/03/2014  . Trapezius muscle spasm 06/06/2014  . [place] HardXyphoidalgia 06/06/2014  . Coccyx pain 06/06/2014  . Diarrhea 01/18/2014  . Dizziness and giddiness 01/18/2014  . Right bundle branch block and left anterior fascicular block 01/18/2014  . Hx of pulmonary embolus 01/13/2014  . Soft tissue mass 01/13/2014  . Obesity (BMI 30-39.9) 01/13/2014  . Allergic rhinitis 01/13/2014   History reviewed. No pertinent surgical history.  Home Medications    Prior to Admission medications   Medication Sig Start Date End Date Taking? Authorizing Provider  amoxicillin (AMOXIL) 500 MG capsule Take 1 capsule (500 mg total) by mouth 2  (two) times daily. 04/26/16   Terressa KoyanagiHannah R Kim, DO  cyclobenzaprine (FLEXERIL) 10 MG tablet Take 1 tablet (10 mg total) by mouth at bedtime as needed for muscle spasms. 05/01/16   Terressa KoyanagiHannah R Kim, DO   Family History Family History  Problem Relation Age of Onset  . Diabetes Mother   . Hypertension Mother   . Heart murmur Mother   . Diabetes Father   . Hypertension Father   . Diabetes Paternal Grandmother   . Diabetes Paternal Grandfather    Social History Social History  Substance Use Topics  . Smoking status: Never Smoker  . Smokeless tobacco: Never Used  . Alcohol use No   Allergies   Review of patient's allergies indicates no known allergies.  Review of Systems Review of Systems  Skin: Positive for wound.  Neurological: Positive for numbness.   Physical Exam Updated Vital Signs BP 122/61 (BP Location: Left Arm)   Pulse 85   Temp 98.6 F (37 C) (Oral)   Resp 20   Ht 6\' 2"  (1.88 m)   Wt 270 lb (122.5 kg)   SpO2 100%   BMI 34.67 kg/m   Physical Exam  Constitutional: He is oriented to person, place, and time. He appears well-developed and well-nourished.  HENT:  Head: Normocephalic and atraumatic.  Cardiovascular: Normal rate.   Pulmonary/Chest: Effort normal.  Musculoskeletal: He exhibits no edema.  Right forearm: 2 mm puncture wound noted to the mid forearm with surrounding skin edema but no foreign object noted; not actively  bleeding, sensation intact distally  Left thumb: 2 cm superficial oblique laceration to the pad of the thumb; not actively bleeding; no foreign body noted   Neurological: He is alert and oriented to person, place, and time.  Skin: Skin is warm and dry.  Psychiatric: He has a normal mood and affect.  Nursing note and vitals reviewed.  ED Treatments / Results  Radiology Dg Forearm Right  Result Date: 05/15/2016 CLINICAL DATA:  Patient was replacing a window when it fell on him, causing a puncture wound to the right forearm and laceration to the  left thumb. Lat distal rt forearm EXAM: RIGHT FOREARM - 2 VIEW COMPARISON:  None. FINDINGS: No fracture.  No bone lesion. Elbow and wrist joints are normally spaced and aligned. No radiopaque foreign body. IMPRESSION: No fracture, dislocation or radiopaque foreign body. Electronically Signed   By: Amie Portland M.D.   On: 05/15/2016 13:52   Procedures Procedures  DIAGNOSTIC STUDIES:  Oxygen Saturation is 100% on RA, normal by my interpretation.    COORDINATION OF CARE:  2:00 PM Discussed treatment plan, which includes X-ray of right forearm with pt at bedside and pt agreed to plan.  Medications Ordered in ED Medications  ibuprofen (ADVIL,MOTRIN) tablet 800 mg (800 mg Oral Given 05/15/16 1325)   Initial Impression / Assessment and Plan / ED Course  I have reviewed the triage vital signs and the nursing notes.  Pertinent labs & imaging results that were available during my care of the patient were reviewed by me and considered in my medical decision making (see chart for details).  Clinical Course   Pt has a contusion to R forearm with small skin tear.  No fb noted on exam, not actively bleeding.  Dressing in place.  Did report tingling sensation to R thumb, however thumb has intact sensation and FROM.  Suspect mild neuropraxia from forearm contusion.  Has superficial lac over L thumb, not actively bleeding.  wound cleansed, sterile strip applied.  Dressing applied.  Tetanus UTD. Laceration occurred < 12 hours prior to repair. Discussed laceration care with pt and answered questions. Pt to f-u for wound check sooner should there be signs of dehiscence or infection. Pt is hemodynamically stable with no complaints prior to dc.     BP 122/61 (BP Location: Left Arm)   Pulse 85   Temp 98.6 F (37 C) (Oral)   Resp 20   Ht  (1.88 m)   Wt 122.5 kg   SpO2 100%   BMI 34.67 kg/m    Final Clinical Impressions(s) / ED Diagnoses   Final diagnoses:  Contusion of right forearm, initial  encounter  Laceration of left thumb, initial encounter    New Prescriptions New Prescriptions   TRAMADOL (ULTRAM) 50 MG TABLET    Take 1 tablet (50 mg total) by mouth every 6 (six) hours as needed.    I personally performed the services described in this documentation, which was scribed in my presence. The recorded information has been reviewed and is accurate.     Fayrene Helper, PA-C 05/15/16 1424    Azalia Bilis, MD 05/15/16 [phone]

## 2016-05-15 NOTE — ED Notes (Signed)
Patient transported to X-ray 

## 2016-05-25 ENCOUNTER — Telehealth: Payer: Self-pay | Admitting: Emergency Medicine

## 2016-05-25 NOTE — Telephone Encounter (Signed)
If advised to f/u w/ Dr Butler DenmarkGrammig (who is a hand specialist) there is no need to f/u here for same injury

## 2016-05-25 NOTE — Telephone Encounter (Signed)
Advised patient wife Waynetta Sandylyria to have the patient to schedule an appointment with the Orthopaedics Dr. Butler DenmarkGrammig. She is agreeable

## 2016-05-25 NOTE — Telephone Encounter (Signed)
Patient's wife called wanting to know if patient needs to be seen by PCP for f/u from ER for wound of right forearm and laceration of left thumb on 05/15/16. He was advised to f/u with Orthopaedics Dr. Amanda PeaGramig in a week. Please advise

## 2016-06-05 NOTE — Pre-Procedure Instructions (Addendum)
Cameron Kim  06/05/2016      [facility] [phone] - [place] - [address] [facility] [address] [place] [id number] Phone: [phone] Fax: [phone]    Your procedure is scheduled on Thursday, September 31.  [facility] 2:00 PM                Your surgery or procedure is scheduled for 4:00 PM   Call this number if you have problems the morning of surgery: [phone]   Remember:  Do not eat food or drink liquids after midnight.  Take these medicines the morning of surgery with A SIP OF WATER: May take if needed cyclobenzaprine (FLEXERIL), traMADol (ULTRAM).  STOP all herbel meds, nsaids (aleve,naproxen,advil,ibuprofen) 5 days prior to surgery starting now including vitamins,aspirin                [facility]- Preparing For Surgery  Before surgery, you can play an important role. Because skin is not sterile, your skin needs to be as free of germs as possible. You can reduce the number of germs on your skin by washing with CHG (chlorahexidine gluconate) Soap before surgery.  CHG is an antiseptic cleaner which kills germs and bonds with the skin to continue killing germs even after washing.  Please do not use if you have an allergy to CHG or antibacterial soaps. If your skin becomes reddened/irritated stop using the CHG.  Do not shave (including legs and underarms) for at least 48 hours prior to first CHG shower. It is OK to shave your face.  Please follow these instructions carefully.   1. Shower the NIGHT BEFORE SURGERY and the MORNING OF SURGERY with CHG.   2. If you chose to wash your hair, wash your hair first as usual with your normal shampoo.  3. After you shampoo, rinse your hair and body thoroughly to remove the shampoo.  4. Use CHG as you would any other liquid soap. You can apply CHG directly to the skin and wash gently with a scrungie or a clean washcloth.   5. Apply the CHG  Soap to your body ONLY FROM THE NECK DOWN.  Do not use on open wounds or open sores. Avoid contact with your eyes, ears, mouth and genitals (private parts). Wash genitals (private parts) with your normal soap.  6. Wash thoroughly, paying special attention to the area where your surgery will be performed.  7. Thoroughly rinse your body with warm water from the neck down.  8. DO NOT shower/wash with your normal soap after using and rinsing off the CHG Soap.  9. Pat yourself dry with a CLEAN TOWEL.   10. Wear CLEAN PAJAMAS   11. Place CLEAN SHEETS on your bed the night of your first shower and DO NOT SLEEP WITH PETS.  Day of Surgery: Do not apply any deodorants/lotions. Please wear clean clothes to the hospital/surgery center.    Do not wear jewelry, make-up or nail polish.  Do not wear lotions, powders, or perfumes, or deodorant.  Men may shave face and neck.  Do not bring valuables to the hospital.  [facility] is not responsible for any belongings or valuables.  Contacts, dentures or bridgework may not be worn into surgery.  Leave your suitcase in the car.  After surgery it may be brought to your room.  For patients admitted to the hospital, discharge time will be  determined by your treatment team.  Patients discharged the day of surgery will not be allowed to drive home.    - Special instructions:   Please read over the  fact sheets that you were given. -

## 2016-06-06 ENCOUNTER — Encounter (HOSPITAL_COMMUNITY): Payer: Self-pay

## 2016-06-06 ENCOUNTER — Encounter (HOSPITAL_COMMUNITY)
Admission: RE | Admit: 2016-06-06 | Discharge: 2016-06-06 | Disposition: A | Discharge status: Home / Self Care | Payer: BLUE CROSS/BLUE SHIELD | Source: Ambulatory Visit | Attending: Orthopedic Surgery | Admitting: Orthopedic Surgery

## 2016-06-06 DIAGNOSIS — Z86711 Personal history of pulmonary embolism: Secondary | ICD-10-CM | POA: Diagnosis not present

## 2016-06-06 DIAGNOSIS — Z86718 Personal history of other venous thrombosis and embolism: Secondary | ICD-10-CM | POA: Diagnosis not present

## 2016-06-06 DIAGNOSIS — S56221A Laceration of other flexor muscle, fascia and tendon at forearm level, right arm, initial encounter: Secondary | ICD-10-CM | POA: Diagnosis not present

## 2016-06-06 DIAGNOSIS — X58XXXA Exposure to other specified factors, initial encounter: Secondary | ICD-10-CM | POA: Diagnosis not present

## 2016-06-06 DIAGNOSIS — S5411XA Injury of median nerve at forearm level, right arm, initial encounter: Secondary | ICD-10-CM | POA: Diagnosis present

## 2016-06-06 DIAGNOSIS — I739 Peripheral vascular disease, unspecified: Secondary | ICD-10-CM | POA: Diagnosis not present

## 2016-06-06 HISTORY — DX: Peripheral vascular disease, unspecified: I73.9

## 2016-06-06 LAB — BASIC METABOLIC PANEL
Anion gap: 5 (ref 5–15)
BUN: 11 mg/dL (ref 6–20)
CALCIUM: 9 mg/dL (ref 8.9–10.3)
CO2: 24 mmol/L (ref 22–32)
CREATININE: 1.21 mg/dL (ref 0.61–1.24)
Chloride: 110 mmol/L (ref 101–111)
GFR calc Af Amer: >60 mL/min (ref >60)
GFR calc non Af Amer: >60 mL/min (ref >60)
GLUCOSE: 102 mg/dL — AB (ref 65–99)
Potassium: 4.4 mmol/L (ref 3.5–5.1)
Sodium: 139 mmol/L (ref 135–145)

## 2016-06-06 LAB — CBC
HEMATOCRIT: 44.9 % (ref 39.0–52.0)
Hemoglobin: 14.6 g/dL (ref 13.0–17.0)
MCH: 29.6 pg (ref 26.0–34.0)
MCHC: 32.5 g/dL (ref 30.0–36.0)
MCV: 91.1 fL (ref 78.0–100.0)
Platelets: 200 10*3/uL (ref 150–400)
RBC: 4.93 MIL/uL (ref 4.22–5.81)
RDW: 13.6 % (ref 11.5–15.5)
WBC: 9.7 10*3/uL (ref 4.0–10.5)

## 2016-06-06 LAB — SURGICAL PCR SCREEN
MRSA, PCR: NEGATIVE
Staphylococcus aureus: NEGATIVE

## 2016-06-06 MED ORDER — DEXTROSE 5 % IV SOLN
3.0000 g | INTRAVENOUS | Status: AC
  Administered 2016-06-07: 3 g via INTRAVENOUS
  Filled 2016-06-06 (×2): qty 3000

## 2016-06-07 ENCOUNTER — Encounter (HOSPITAL_COMMUNITY): Payer: Self-pay | Admitting: Anesthesiology

## 2016-06-07 ENCOUNTER — Encounter (HOSPITAL_COMMUNITY)
Admission: RE | Disposition: A | Discharge status: Home / Self Care | Payer: Self-pay | Source: Ambulatory Visit | Attending: Orthopedic Surgery

## 2016-06-07 ENCOUNTER — Ambulatory Visit (HOSPITAL_COMMUNITY): Payer: BLUE CROSS/BLUE SHIELD | Admitting: Anesthesiology

## 2016-06-07 ENCOUNTER — Ambulatory Visit (HOSPITAL_COMMUNITY)
Admission: RE | Admit: 2016-06-07 | Discharge: 2016-06-07 | Disposition: A | Discharge status: Home / Self Care | Payer: BLUE CROSS/BLUE SHIELD | Source: Ambulatory Visit | Attending: Orthopedic Surgery | Admitting: Orthopedic Surgery

## 2016-06-07 DIAGNOSIS — Z86718 Personal history of other venous thrombosis and embolism: Secondary | ICD-10-CM | POA: Insufficient documentation

## 2016-06-07 DIAGNOSIS — S56221A Laceration of other flexor muscle, fascia and tendon at forearm level, right arm, initial encounter: Secondary | ICD-10-CM | POA: Insufficient documentation

## 2016-06-07 DIAGNOSIS — I739 Peripheral vascular disease, unspecified: Secondary | ICD-10-CM | POA: Insufficient documentation

## 2016-06-07 DIAGNOSIS — Z86711 Personal history of pulmonary embolism: Secondary | ICD-10-CM | POA: Insufficient documentation

## 2016-06-07 DIAGNOSIS — S5411XA Injury of median nerve at forearm level, right arm, initial encounter: Secondary | ICD-10-CM | POA: Insufficient documentation

## 2016-06-07 DIAGNOSIS — X58XXXA Exposure to other specified factors, initial encounter: Secondary | ICD-10-CM | POA: Insufficient documentation

## 2016-06-07 HISTORY — PX: I & D EXTREMITY: SHX5045

## 2016-06-07 SURGERY — IRRIGATION AND DEBRIDEMENT EXTREMITY
Anesthesia: General | Site: Wrist | Laterality: Right

## 2016-06-07 MED ORDER — PROPOFOL 10 MG/ML IV BOLUS
INTRAVENOUS | Status: AC
  Filled 2016-06-07: qty 40

## 2016-06-07 MED ORDER — POVIDONE-IODINE 10 % EX SWAB: 2.0000 "application " | Freq: Once | CUTANEOUS | Status: DC

## 2016-06-07 MED ORDER — PROPOFOL 10 MG/ML IV BOLUS
INTRAVENOUS | Status: DC | PRN
  Administered 2016-06-07: 200 mg via INTRAVENOUS

## 2016-06-07 MED ORDER — FENTANYL CITRATE (PF) 100 MCG/2ML IJ SOLN
INTRAMUSCULAR | Status: DC | PRN
  Administered 2016-06-07 (×4): 25 ug via INTRAVENOUS

## 2016-06-07 MED ORDER — PHENYLEPHRINE HCL 10 MG/ML IJ SOLN
INTRAMUSCULAR | Status: DC | PRN
  Administered 2016-06-07 (×3): 80 ug via INTRAVENOUS

## 2016-06-07 MED ORDER — MIDAZOLAM HCL 2 MG/2ML IJ SOLN
INTRAMUSCULAR | Status: AC
  Filled 2016-06-07: qty 2

## 2016-06-07 MED ORDER — ONDANSETRON HCL 4 MG/2ML IJ SOLN
INTRAMUSCULAR | Status: DC | PRN
  Administered 2016-06-07: 4 mg via INTRAVENOUS

## 2016-06-07 MED ORDER — BUPIVACAINE HCL (PF) 0.25 % IJ SOLN
INTRAMUSCULAR | Status: AC
  Filled 2016-06-07: qty 30

## 2016-06-07 MED ORDER — SODIUM CHLORIDE 0.9 % IR SOLN
Status: DC | PRN
  Administered 2016-06-07: 1000 mL

## 2016-06-07 MED ORDER — BUPIVACAINE HCL (PF) 0.25 % IJ SOLN
INTRAMUSCULAR | Status: DC | PRN
  Administered 2016-06-07: 15 mL

## 2016-06-07 MED ORDER — FENTANYL CITRATE (PF) 100 MCG/2ML IJ SOLN
INTRAMUSCULAR | Status: AC
  Filled 2016-06-07: qty 2

## 2016-06-07 MED ORDER — OXYCODONE-ACETAMINOPHEN 5-325 MG PO TABS: 2.0000 | ORAL_TABLET | ORAL | 0 refills | Status: DC | PRN

## 2016-06-07 MED ORDER — LACTATED RINGERS IV SOLN
INTRAVENOUS | Status: DC
  Administered 2016-06-07 (×3): via INTRAVENOUS

## 2016-06-07 MED ORDER — MIDAZOLAM HCL 5 MG/5ML IJ SOLN
INTRAMUSCULAR | Status: DC | PRN
  Administered 2016-06-07: 2 mg via INTRAVENOUS

## 2016-06-07 MED ORDER — DEXAMETHASONE SODIUM PHOSPHATE 10 MG/ML IJ SOLN
INTRAMUSCULAR | Status: DC | PRN
  Administered 2016-06-07: 10 mg via INTRAVENOUS

## 2016-06-07 MED ORDER — HYDROMORPHONE HCL 1 MG/ML IJ SOLN
INTRAMUSCULAR | Status: AC
  Filled 2016-06-07: qty 1

## 2016-06-07 MED ORDER — HYDROMORPHONE HCL 1 MG/ML IJ SOLN
0.2500 mg | INTRAMUSCULAR | Status: DC | PRN
  Administered 2016-06-07 (×2): 0.5 mg via INTRAVENOUS

## 2016-06-07 MED ORDER — LIDOCAINE 2% (20 MG/ML) 5 ML SYRINGE
INTRAMUSCULAR | Status: DC | PRN
  Administered 2016-06-07: 60 mg via INTRAVENOUS

## 2016-06-07 MED ORDER — CHLORHEXIDINE GLUCONATE 4 % EX LIQD: 60.0000 mL | Freq: Once | CUTANEOUS | Status: DC

## 2016-06-07 SURGICAL SUPPLY — 44 items
BANDAGE ACE 3X5.8 VEL STRL LF (GAUZE/BANDAGES/DRESSINGS) ×3 IMPLANT
BANDAGE ACE 4X5 VEL STRL LF (GAUZE/BANDAGES/DRESSINGS) ×3 IMPLANT
BNDG GAUZE ELAST 4 BULKY (GAUZE/BANDAGES/DRESSINGS) ×3 IMPLANT
CORDS BIPOLAR (ELECTRODE) ×3 IMPLANT
CUFF TOURNIQUET SINGLE 18IN (TOURNIQUET CUFF) ×3 IMPLANT
CUFF TOURNIQUET SINGLE 24IN (TOURNIQUET CUFF) IMPLANT
DRSG ADAPTIC 3X8 NADH LF (GAUZE/BANDAGES/DRESSINGS) ×3 IMPLANT
GAUZE SPONGE 4X4 12PLY STRL (GAUZE/BANDAGES/DRESSINGS) ×3 IMPLANT
GAUZE XEROFORM 1X8 LF (GAUZE/BANDAGES/DRESSINGS) ×3 IMPLANT
GLOVE BIOGEL M 8.0 STRL (GLOVE) ×3 IMPLANT
GLOVE SS BIOGEL STRL SZ 8 (GLOVE) ×1 IMPLANT
GLOVE SUPERSENSE BIOGEL SZ 8 (GLOVE) ×2
GLOVE SURG SS PI 6.5 STRL IVOR (GLOVE) ×6 IMPLANT
GOWN STRL REUS W/ TWL LRG LVL3 (GOWN DISPOSABLE) ×1 IMPLANT
GOWN STRL REUS W/ TWL XL LVL3 (GOWN DISPOSABLE) ×2 IMPLANT
GOWN STRL REUS W/TWL LRG LVL3 (GOWN DISPOSABLE) ×2
GOWN STRL REUS W/TWL XL LVL3 (GOWN DISPOSABLE) ×4
GUIDE NERVE NEUROGEN 7X3 CM (Tissue) ×1 IMPLANT
HANDPIECE INTERPULSE COAX TIP (DISPOSABLE)
KIT BASIN OR (CUSTOM PROCEDURE TRAY) ×3 IMPLANT
KIT ROOM TURNOVER OR (KITS) ×3 IMPLANT
MANIFOLD NEPTUNE II (INSTRUMENTS) ×3 IMPLANT
NEEDLE HYPO 25GX1X1/2 BEV (NEEDLE) ×3 IMPLANT
NERVE GUIDE NEUROGEN 7X3 CM (Tissue) ×3 IMPLANT
NS IRRIG 1000ML POUR BTL (IV SOLUTION) ×3 IMPLANT
PACK ORTHO EXTREMITY (CUSTOM PROCEDURE TRAY) ×3 IMPLANT
PAD ARMBOARD 7.5X6 YLW CONV (MISCELLANEOUS) ×3 IMPLANT
PAD CAST 3X4 CTTN HI CHSV (CAST SUPPLIES) ×1 IMPLANT
PAD CAST 4YDX4 CTTN HI CHSV (CAST SUPPLIES) ×1 IMPLANT
PADDING CAST COTTON 3X4 STRL (CAST SUPPLIES) ×2
PADDING CAST COTTON 4X4 STRL (CAST SUPPLIES) ×2
SET HNDPC FAN SPRY TIP SCT (DISPOSABLE) IMPLANT
SPONGE LAP 4X18 X RAY DECT (DISPOSABLE) ×3 IMPLANT
SUT ETHILON 7 0 P 6 18 (SUTURE) ×3 IMPLANT
SUT ETHILON 8 0 BV130 4 (SUTURE) ×3 IMPLANT
SUT PROLENE 4 0 P 3 18 (SUTURE) ×3 IMPLANT
SUT PROLENE 4 0 PS 2 18 (SUTURE) ×3 IMPLANT
SYR CONTROL 10ML LL (SYRINGE) ×3 IMPLANT
TOWEL OR 17X24 6PK STRL BLUE (TOWEL DISPOSABLE) ×3 IMPLANT
TOWEL OR 17X26 10 PK STRL BLUE (TOWEL DISPOSABLE) ×3 IMPLANT
TUBE ANAEROBIC SPECIMEN COL (MISCELLANEOUS) IMPLANT
TUBE CONNECTING 12'X1/4 (SUCTIONS) ×1
TUBE CONNECTING 12X1/4 (SUCTIONS) ×2 IMPLANT
YANKAUER SUCT BULB TIP NO VENT (SUCTIONS) ×3 IMPLANT

## 2016-06-07 NOTE — Anesthesia Procedure Notes (Signed)
Procedure Name: LMA Insertion Date/Time: 06/07/2016 6:23 PM Performed by: Little IshikawaMERCER, Eutha Cude L Pre-anesthesia Checklist: Patient identified, Emergency Drugs available, Suction available and Patient being monitored Patient Re-evaluated:Patient Re-evaluated prior to inductionOxygen Delivery Method: Circle System Utilized Preoxygenation: Pre-oxygenation with 100% oxygen Intubation Type: IV induction Ventilation: Mask ventilation without difficulty LMA: LMA inserted LMA Size: 5.0 Number of attempts: 1 Airway Equipment and Method: Bite block Placement Confirmation: positive ETCO2 Tube secured with: Tape Dental Injury: Teeth and Oropharynx as per pre-operative assessment

## 2016-06-07 NOTE — Discharge Instructions (Signed)
We recommend that you to take vitamin C 1000 mg a day to promote healing. We also recommend Vit B6 200mg  a day to promote nerve healing We also recommend that if you require  pain medicine that you take a stool softener to prevent constipation as most pain medicines will have constipation side effects. We recommend either Peri-Colace or Senokot and recommend that you also consider adding MiraLAX as well to prevent the constipation affects from pain medicine if you are required to use them. These medicines are over the counter and may be purchased at a local pharmacy. A cup of yogurt and a probiotic can also be helpful during the recovery process as the medicines can disrupt your intestinal environment. Keep bandage clean and dry.  Call for any problems.  No smoking.  Criteria for driving a car: you should be off your pain medicine for 7-8 hours, able to drive one handed(confident), thinking clearly and feeling able in your judgement to drive. Continue elevation as it will decrease swelling.  If instructed by MD move your fingers within the confines of the bandage/splint.  Use ice if instructed by your MD. Call immediately for any sudden loss of feeling in your hand/arm or change in functional abilities of the extremity.

## 2016-06-07 NOTE — Anesthesia Preprocedure Evaluation (Signed)
Anesthesia Evaluation  Patient identified by MRN, date of birth, ID band Patient awake    Reviewed: Allergy & Precautions, NPO status , Patient's Chart, lab work & pertinent test results  Airway Mallampati: II  TM Distance: >3 FB     Dental   Pulmonary neg pulmonary ROS,    breath sounds clear to auscultation       Cardiovascular + Peripheral Vascular Disease  + dysrhythmias  Rhythm:Regular Rate:Normal     Neuro/Psych    GI/Hepatic negative GI ROS, Neg liver ROS,   Endo/Other  negative endocrine ROS  Renal/GU negative Renal ROS     Musculoskeletal   Abdominal   Peds  Hematology   Anesthesia Other Findings   Reproductive/Obstetrics                             Anesthesia Physical Anesthesia Plan  ASA: III  Anesthesia Plan: General   Post-op Pain Management:    Induction: Intravenous  Airway Management Planned: LMA  Additional Equipment:   Intra-op Plan:   Post-operative Plan: Extubation in OR  Informed Consent:   Dental advisory given  Plan Discussed with: CRNA and Anesthesiologist  Anesthesia Plan Comments:         Anesthesia Quick Evaluation

## 2016-06-07 NOTE — H&P (Signed)
Cameron Kim is an 45 y.o. male.   Chief Complaint: laceration to the right forearm HPI: Patient presents with laceration to the forearm with median nerve injury. This is a small laceration but one with associated motor function and sensory loss to the median nerve distribution right upper extremity. I discussed this with him and his wife they decided proceed with expiration repair is necessary.  He denies other issues.  Patient presents for evaluation and treatment of the of their upper extremity predicament. The patient denies neck, back, chest or  abdominal pain. The patient notes that they have no lower extremity problems. The patients primary complaint is noted. We are planning surgical care pathway for the upper extremity.  Past Medical History:  Diagnosis Date  . Abnormal EKG   . Ankle fracture, left   . Peripheral vascular disease ([place]) 2013   dvt-pe after ankle fx  . Pulmonary embolism ([place]) 05/2012    Past Surgical History:  Procedure Laterality Date  . NO PAST SURGERIES      Family History  Problem Relation Age of Onset  . Diabetes Mother   . Hypertension Mother   . Heart murmur Mother   . Diabetes Father   . Hypertension Father   . Diabetes Paternal Grandmother   . Diabetes Paternal Grandfather    Social History:  reports that he has never smoked. He has never used smokeless tobacco. He reports that he does not drink alcohol or use drugs.  Allergies: No Known Allergies  Medications Prior to Admission  Medication Sig Dispense Refill  . cyclobenzaprine (FLEXERIL) 10 MG tablet Take 1 tablet (10 mg total) by mouth at bedtime as needed for muscle spasms. 20 tablet 0  . traMADol (ULTRAM) 50 MG tablet Take 1 tablet (50 mg total) by mouth every 6 (six) hours as needed. (Patient not taking: Reported on 06/04/2016) 15 tablet 0    Results for orders placed or performed during the hospital encounter of 06/06/16 (from the past 48 hour(s))  Surgical pcr screen     Status:  None   Collection Time: 06/06/16  8:26 AM  Result Value Ref Range   MRSA, PCR NEGATIVE NEGATIVE   Staphylococcus aureus NEGATIVE NEGATIVE    Comment:        The Xpert SA Assay (FDA approved for NASAL specimens in patients over 10 years of age), is one component of a comprehensive surveillance program.  Test performance has been validated by [facility] for patients greater than or equal to 61 year old. It is not intended to diagnose infection nor to guide or monitor treatment.   CBC     Status: None   Collection Time: 06/06/16  8:29 AM  Result Value Ref Range   WBC 9.7 4.0 - 10.5 K/uL   RBC 4.93 4.22 - 5.81 MIL/uL   Hemoglobin 14.6 13.0 - 17.0 g/dL   HCT 44.9 39.0 - 52.0 %   MCV 91.1 78.0 - 100.0 fL   MCH 29.6 26.0 - 34.0 pg   MCHC 32.5 30.0 - 36.0 g/dL   RDW 13.6 11.5 - 15.5 %   Platelets 200 150 - 400 K/uL  Basic metabolic panel     Status: Abnormal   Collection Time: 06/06/16  8:29 AM  Result Value Ref Range   Sodium 139 135 - 145 mmol/L   Potassium 4.4 3.5 - 5.1 mmol/L   Chloride 110 101 - 111 mmol/L   CO2 24 22 - 32 mmol/L   Glucose, Bld 102 (H)  65 - 99 mg/dL   BUN 11 6 - 20 mg/dL   Creatinine, Ser 1.21 0.61 - 1.24 mg/dL   Calcium 9.0 8.9 - 10.3 mg/dL   GFR calc non Af Amer >60 >60 mL/min   GFR calc Af Amer >60 >60 mL/min    Comment: (NOTE) The eGFR has been calculated using the CKD EPI equation. This calculation has not been validated in all clinical situations. eGFR's persistently <60 mL/min signify possible Chronic Kidney Disease.    Anion gap 5 5 - 15   No results found.  Review of Systems  Eyes: Negative.   Gastrointestinal: Negative.   Genitourinary: Negative.   Neurological: Negative.   Endo/Heme/Allergies: Negative.     Blood pressure (!) 138/95, pulse 80, temperature 98.6 F (37 C), temperature source Oral, resp. rate 18, height 6' 2"  (1.88 m), weight 124.5 kg (274 lb 8 oz), SpO2 97 %. Physical Exam laceration right forearm was in a  centimeter with median nerve insult. He has loss of abductor pollicis brevis function (thenar muscle) and sensory disturbance   Given the timeframe duration from injury I'm highly concerned about the repair and its difficulty.  The patient is alert and oriented in no acute distress. The patient complains of pain in the affected upper extremity.  The patient is noted to have a normal HEENT exam. Lung fields show equal chest expansion and no shortness of breath. Abdomen exam is nontender without distention. Lower extremity examination does not show any fracture dislocation or blood clot symptoms. Pelvis is stable and the neck and back are stable and nontender. Assessment/Plan  We'll plan for exploration median nerve no lysis repair is necessary about 4 I discussed him risk and benefits and he desires to proceed.  I discussed with him meaningful expectations and the timeframe duration of recovery  We are planning surgery for your upper extremity. The risk and benefits of surgery to include risk of bleeding, infection, anesthesia,  damage to normal structures and failure of the surgery to accomplish its intended goals of relieving symptoms and restoring function have been discussed in detail. With this in mind we plan to proceed. I have specifically discussed with the patient the pre-and postoperative regime and the dos and don'ts and risk and benefits in great detail. Risk and benefits of surgery also include risk of dystrophy(CRPS), chronic nerve pain, failure of the healing process to go onto completion and other inherent risks of surgery The relavent the pathophysiology of the disease/injury process, as well as the alternatives for treatment and postoperative course of action has been discussed in great detail with the patient who desires to proceed.  We will do everything in our power to help you (the patient) restore function to the upper extremity. It is a pleasure to see this patient  today.   Paulene Floor, MD 06/07/2016, 5:56 PM

## 2016-06-07 NOTE — Transfer of Care (Signed)
Immediate Anesthesia Transfer of Care Note  Patient: Cameron Kim  Procedure(s) Performed: Procedure(s): IRRIGATION AND DEBRIDEMENT RIGHT WRIST,MEDIAN NERVE EXPLORATION NEUROLYSIS AND REPAIR (Right)  Patient Location: PACU  Anesthesia Type:General  Level of Consciousness: awake and alert   Airway & Oxygen Therapy: Patient Spontanous Breathing and Patient connected to nasal cannula oxygen  Post-op Assessment: Report given to RN, Post -op Vital signs reviewed and stable and Patient moving all extremities X 4  Post vital signs: Reviewed and stable  Last Vitals:  Vitals:   06/07/16 1447 06/07/16 1942  BP: (!) 138/95 (!) 146/95  Pulse: 80 70  Resp: 18 13  Temp: 37 C 36.4 C    Last Pain:  Vitals:   06/07/16 1447  TempSrc: Oral         Complications: No apparent anesthesia complications

## 2016-06-07 NOTE — Anesthesia Postprocedure Evaluation (Signed)
Anesthesia Post Note  Patient: Cameron Kim  Procedure(s) Performed: Procedure(s) (LRB): IRRIGATION AND DEBRIDEMENT RIGHT WRIST,MEDIAN NERVE EXPLORATION NEUROLYSIS AND REPAIR (Right)  Patient location during evaluation: PACU Anesthesia Type: General Level of consciousness: awake Pain management: pain level controlled Vital Signs Assessment: post-procedure vital signs reviewed and stable Respiratory status: spontaneous breathing Cardiovascular status: stable Postop Assessment: no signs of nausea or vomiting Anesthetic complications: no    Last Vitals:  Vitals:   06/07/16 2009 06/07/16 2013  BP: (!) 143/99 (!) 147/93  Pulse: 75 74  Resp: 14 13  Temp: 36.5 C     Last Pain:  Vitals:   06/07/16 2013  TempSrc:   PainSc: 2                  Dior Dominik

## 2016-06-08 NOTE — Op Note (Addendum)
Full op note  Dictated Mairin Lindsley MD  Note not recovered thus I redictated- See Dictation # [id number] on 06/12/16  Dominic Mahaney MD

## 2016-06-12 ENCOUNTER — Encounter (HOSPITAL_COMMUNITY): Payer: Self-pay | Admitting: Orthopedic Surgery

## 2016-06-13 NOTE — Op Note (Signed)
NAME:  Cameron Kim, Cameron Kim                ACCOUNT NO.:  [id number]  MEDICAL RECORD NO.:  [id number]  LOCATION:  [facility]                         FACILITY:  [facility]  PHYSICIAN:  Dionne AnoWilliam M. Grantley Savage, M.D.DATE OF BIRTH:  [birth year]  DATE OF PROCEDURE:  06/07/2016 DATE OF DISCHARGE:  06/07/2016                              OPERATIVE REPORT   PREOPERATIVE DIAGNOSIS:  Laceration distal third forearm with median nerve deficit.  POSTOPERATIVE DIAGNOSIS:  Laceration distal third forearm with median nerve deficit with noted median nerve injury, right distal third of the forearm.  PROCEDURE: 1. Irrigation and debridement of skin, subcutaneous tissue, muscle,     and tendon as well as nerve with scissor, knife, blade, and     curette.  This was an excisional debridement. 2. Direct median nerve repair of 75% laceration utilizing an epineural     technique, 7-0 and 8-0 nylon and additional NeuraGen nerve wrap     around the nerve. 3. Flexor tenolysis, tenosynovectomy; right distal third of the     forearm.  SURGEON:  Dionne AnoWilliam M. Amanda PeaGramig, M.D.  ASSISTANT:  None.  COMPLICATIONS:  None.  ANESTHESIA:  General.  TOURNIQUET TIME:  Less than an hour.  INDICATIONS:  A 45 year old male, who presents for definitive treatment. This patient was originally seen in the emergency room setting and underwent triage.  Subsequent to this, the patient presented to my office for evaluation.  The patient was noted to have median nerve deficit.  We saw him in the office and immediately booked him for surgery.  Unfortunately given the timeframe duration from injury to eval by myself, the concern is the difficulty in repairing the nerve and the nerve's quality.  Certainly, it is a small injury, but it is one which has significant abnormality and this abnormality implies a median nerve injury.  OPERATIVE FINDINGS:  This patient has a 75% median nerve injury with large amount of scar tissue.  This was repaired directly  with epineural technique and resection of early scar formation.  A NeuraGen nerve wrap was also used (see below).  OPERATIVE PROCEDURE:  The patient was seen by myself and Anesthesia, taken to operative theater and underwent smooth induction of general anesthesia, was prepped and draped with Hibiclens, prescrubbed by myself, followed by 10 minutes surgical Betadine scrub and paint.  Arm was then elevated.  Time-out observed.  Tourniquet was insufflated to 250 mmHg.  A modified Brunner incision was made volarly.  I very carefully and cautiously dissected down and performed an irrigation and debridement of skin, subcutaneous tissue, muscle, tendon, and nerve tissue.  The median nerve was isolated proximally and distally, pictures were taken of the injury; and following this, an extensive median nerve neurolysis was accomplished.  The injury was noted, irrigation was applied, copious in nature.  This excisional debridement with curette, knife, blade, and scissor was performed with copious amounts of irrigant placed.  Following this, flexor tenolysis, tenosynovectomy in the vicinity was accomplished.  The FDP, FDS, and FCR tendons underwent tenolysis, tenosynovectomy.  Following this, I outlined the nerve, resected the area of abnormality which involved 75% of the nerve and then performed a direct epineural repair with combination of 7-0  and 8-0 nylon.  Once this was complete, I then wrapped this with NeuraGen nerve tape.  This was a direct repair of 75% laceration to Cameron Kim peripheral nerve.  Tourniquet was deflated, hemostasis secured.  Wound closed with Prolene.  He was placed in a splint.  The nerve looked excellently tensioned and no complicating features were noted.  Given his age of 45, degree of injury, and the timeframe duration from injury to repair; I do have some concerns about his healing and discussed with him previously meaningful expectations.  We have discussed all  issues at length, these notes etc.  Should any problems arise, we will be immediately available, otherwise look forward to seeing him in the office in 12-14 days.     Dionne Ano. Amanda Pea, M.D.     [facility]  D:  06/13/2016  T:  06/13/2016  Job:  [id number]

## 2017-02-25 ENCOUNTER — Ambulatory Visit (INDEPENDENT_AMBULATORY_CARE_PROVIDER_SITE_OTHER): Payer: BLUE CROSS/BLUE SHIELD | Admitting: Family Medicine

## 2017-02-25 ENCOUNTER — Encounter: Payer: Self-pay | Admitting: Family Medicine

## 2017-02-25 VITALS — BP 128/83 | HR 86 | Temp 98.2°F | Resp 17 | Ht 74.0 in | Wt 276.1 lb

## 2017-02-25 DIAGNOSIS — Z0000 Encounter for general adult medical examination without abnormal findings: Secondary | ICD-10-CM | POA: Diagnosis not present

## 2017-02-25 DIAGNOSIS — E7439 Other disorders of intestinal carbohydrate absorption: Secondary | ICD-10-CM | POA: Diagnosis not present

## 2017-02-25 LAB — CBC WITH DIFFERENTIAL/PLATELET
BASOS ABS: 0 {cells}/uL (ref 0–200)
Basophils Relative: 0 %
EOS ABS: 94 {cells}/uL (ref 15–500)
Eosinophils Relative: 1 %
HEMATOCRIT: 44.9 % (ref 38.5–50.0)
HEMOGLOBIN: 15.3 g/dL (ref 13.2–17.1)
Lymphocytes Relative: 21 %
Lymphs Abs: 1974 cells/uL (ref 850–3900)
MCH: 30.2 pg (ref 27.0–33.0)
MCHC: 34.1 g/dL (ref 32.0–36.0)
MCV: 88.6 fL (ref 80.0–100.0)
MONO ABS: 658 {cells}/uL (ref 200–950)
MPV: 10.6 fL (ref 7.5–12.5)
Monocytes Relative: 7 %
NEUTROS ABS: 6674 {cells}/uL (ref 1500–7800)
NEUTROS PCT: 71 %
Platelets: 195 10*3/uL (ref 140–400)
RBC: 5.07 MIL/uL (ref 4.20–5.80)
RDW: 13.8 % (ref 11.0–15.0)
WBC: 9.4 10*3/uL (ref 3.8–10.8)
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
## 2017-02-25 NOTE — Patient Instructions (Signed)
Follow up in 1 month to recheck mood We'll notify you of your lab results and make any changes if needed Continue to work on healthy diet and regular exercise- you can do it!!! If the bad days start to outnumber the good days- please call me! Call with any questions or concerns Hang in there!!!

## 2017-02-25 NOTE — Progress Notes (Signed)
Pre visit review using our clinic review tool, if applicable. No additional management support is needed unless otherwise documented below in the visit note. 

## 2017-02-26 LAB — BASIC METABOLIC PANEL
BUN: 11 mg/dL (ref 7–25)
CO2: 24 mmol/L (ref 20–31)
CREATININE: 1.1 mg/dL (ref 0.60–1.35)
Calcium: 8.8 mg/dL (ref 8.6–10.3)
Chloride: 104 mmol/L (ref 98–110)
GLUCOSE: 86 mg/dL (ref 65–99)
Potassium: 4.2 mmol/L (ref 3.5–5.3)
Sodium: 138 mmol/L (ref 135–146)

## 2017-02-26 LAB — LIPID PANEL
CHOL/HDL RATIO: 3.4 ratio (ref <5.0)
Cholesterol: 193 mg/dL (ref <200)
HDL: 57 mg/dL (ref >40)
LDL CALC: 117 mg/dL — AB (ref <100)
TRIGLYCERIDES: 97 mg/dL (ref <150)
VLDL: 19 mg/dL (ref <30)

## 2017-02-26 LAB — PSA: PSA: 1 ng/mL (ref <=4.0)

## 2017-02-26 LAB — TSH: TSH: 1.98 m[IU]/L (ref 0.40–4.50)

## 2017-02-26 LAB — HEPATIC FUNCTION PANEL
ALT: 26 U/L (ref 9–46)
AST: 21 U/L (ref 10–40)
Albumin: 4 g/dL (ref 3.6–5.1)
Alkaline Phosphatase: 63 U/L (ref 40–115)
BILIRUBIN DIRECT: 0.1 mg/dL (ref <=0.2)
Indirect Bilirubin: 0.6 mg/dL (ref 0.2–1.2)
TOTAL PROTEIN: 7.1 g/dL (ref 6.1–8.1)
Total Bilirubin: 0.7 mg/dL (ref 0.2–1.2)

## 2017-03-25 ENCOUNTER — Ambulatory Visit (INDEPENDENT_AMBULATORY_CARE_PROVIDER_SITE_OTHER): Payer: BLUE CROSS/BLUE SHIELD | Admitting: Family Medicine

## 2017-03-25 ENCOUNTER — Encounter: Payer: Self-pay | Admitting: Family Medicine

## 2017-03-25 DIAGNOSIS — F4321 Adjustment disorder with depressed mood: Secondary | ICD-10-CM | POA: Insufficient documentation

## 2017-03-25 MED ORDER — FLUOXETINE HCL 20 MG PO TABS: 20.0000 mg | ORAL_TABLET | Freq: Every day | ORAL | 3 refills | Status: DC

## 2017-03-25 NOTE — Patient Instructions (Signed)
Follow up in 1 month to recheck mood Start the Fluoxetine (Prozac) once daily- take w/ food Continue to take time for you!!  You deserve it!! Call with any questions or concerns Hang in there!  We're going to get this better!!!

## 2017-03-25 NOTE — Progress Notes (Signed)
Pre visit review using our clinic review tool, if applicable. No additional management support is needed unless otherwise documented below in the visit note. 

## 2017-03-25 NOTE — Progress Notes (Signed)
   Subjective:    Patient ID: James [place], male    DOB: 11/26/1970, 46 y.o.   MRN: [id number]  HPI Mood follow up- pt has gained another 6 lbs.  Pt reports continued poor sleep.  Is very hesitant to take medications.  Admits to poor motivation, decreased energy level, emotional eating.   Review of Systems For ROS see HPI     Objective:   Physical Exam  Constitutional: He is oriented to person, place, and time. He appears well-developed and well-nourished. No distress.  obese  HENT:  Head: Normocephalic and atraumatic.  Neurological: He is alert and oriented to person, place, and time.  Skin: Skin is warm and dry.  Psychiatric: He has a normal mood and affect. His behavior is normal. Thought content normal.  Vitals reviewed.         Assessment & Plan:

## 2017-03-25 NOTE — Assessment & Plan Note (Signed)
New.  Since pt's wife left him, he has really been struggling.  Emotional eating, weight gain, increased BP, lack of motivation, poor sleep.  Pt is very hesitant to start medication but after reviewing his symptoms, he admits that he would likely benefit.  Will start low dose fluoxetine daily and monitor closely for improvement.  If BP doesn't return to normal, will need to consider additional medication at next visit.  Pt expressed understanding and is in agreement w/ plan.

## 2017-04-22 ENCOUNTER — Encounter: Payer: Self-pay | Admitting: Family Medicine

## 2017-04-22 ENCOUNTER — Ambulatory Visit (INDEPENDENT_AMBULATORY_CARE_PROVIDER_SITE_OTHER): Payer: BLUE CROSS/BLUE SHIELD | Admitting: Family Medicine

## 2017-04-22 VITALS — BP 130/83 | HR 78 | Temp 98.0°F | Resp 16 | Ht 74.0 in | Wt 285.0 lb

## 2017-04-22 DIAGNOSIS — F4321 Adjustment disorder with depressed mood: Secondary | ICD-10-CM

## 2017-04-22 NOTE — Assessment & Plan Note (Signed)
Improved since starting Prozac.  Pt feels that mood and sleep is improving.  Wants to work on his healthy diet and regular exercise to lose the 20lbs he gained.  Encouraged these efforts.  Will follow.

## 2017-04-22 NOTE — Progress Notes (Signed)
   Subjective:    Patient ID: Cameron Kim, male    DOB: 11-14-1970, 46 y.o.   MRN: [id number]  HPI Adjustment disorder- pt feels mood is improving, sleeping 'a little bit better'.  Pt feels less on edge, less 'wound'.  Irritability is improving.  Increased smiling and enjoyment.  Pt is not getting any regular exercise w/ exception of walking dog twice daily.   Review of Systems For ROS see HPI     Objective:   Physical Exam  Constitutional: He is oriented to person, place, and time. He appears well-developed and well-nourished. No distress.  HENT:  Head: Normocephalic and atraumatic.  Eyes: Pupils are equal, round, and reactive to light. Conjunctivae and EOM are normal.  Neck: Normal range of motion. Neck supple. No thyromegaly present.  Cardiovascular: Normal rate, regular rhythm, normal heart sounds and intact distal pulses.   No murmur heard. Pulmonary/Chest: Effort normal and breath sounds normal. No respiratory distress.  Abdominal: Soft. Bowel sounds are normal. He exhibits no distension.  Musculoskeletal: He exhibits no edema.  Lymphadenopathy:    He has no cervical adenopathy.  Neurological: He is alert and oriented to person, place, and time. No cranial nerve deficit.  Skin: Skin is warm and dry.  Psychiatric: He has a normal mood and affect. His behavior is normal.  Vitals reviewed.         Assessment & Plan:

## 2017-04-22 NOTE — Progress Notes (Signed)
Pre visit review using our clinic review tool, if applicable. No additional management support is needed unless otherwise documented below in the visit note. 

## 2017-04-22 NOTE — Patient Instructions (Addendum)
Follow up in 6 months to recheck mood and weight loss Continue the Fluoxetine daily- you're doing great!!! Continue to work on healthy diet and regular exercise- you can do it!! Call with any questions or concerns Hang in there!  You're doing great!!!

## 2017-10-21 ENCOUNTER — Ambulatory Visit: Payer: BLUE CROSS/BLUE SHIELD | Admitting: Family Medicine

## 2017-10-31 ENCOUNTER — Encounter: Payer: Self-pay | Admitting: General Practice

## 2017-10-31 ENCOUNTER — Encounter: Payer: Self-pay | Admitting: Family Medicine

## 2017-10-31 ENCOUNTER — Other Ambulatory Visit: Payer: Self-pay

## 2017-10-31 ENCOUNTER — Ambulatory Visit (INDEPENDENT_AMBULATORY_CARE_PROVIDER_SITE_OTHER): Payer: BLUE CROSS/BLUE SHIELD | Admitting: Family Medicine

## 2017-10-31 VITALS — BP 135/82 | HR 111 | Temp 98.2°F | Resp 16 | Ht 74.0 in | Wt 302.5 lb

## 2017-10-31 DIAGNOSIS — E669 Obesity, unspecified: Secondary | ICD-10-CM | POA: Diagnosis not present

## 2017-10-31 DIAGNOSIS — N529 Male erectile dysfunction, unspecified: Secondary | ICD-10-CM | POA: Diagnosis not present

## 2017-10-31 DIAGNOSIS — F4321 Adjustment disorder with depressed mood: Secondary | ICD-10-CM | POA: Diagnosis not present

## 2017-10-31 LAB — HEPATIC FUNCTION PANEL
ALT: 20 U/L (ref 0–53)
AST: 24 U/L (ref 0–37)
Albumin: 4.2 g/dL (ref 3.5–5.2)
Alkaline Phosphatase: 70 U/L (ref 39–117)
BILIRUBIN DIRECT: 0.2 mg/dL (ref 0.0–0.3)
BILIRUBIN TOTAL: 1 mg/dL (ref 0.2–1.2)
TOTAL PROTEIN: 7.6 g/dL (ref 6.0–8.3)

## 2017-10-31 LAB — CBC WITH DIFFERENTIAL/PLATELET
BASOS ABS: 0.1 10*3/uL (ref 0.0–0.1)
Basophils Relative: 0.7 % (ref 0.0–3.0)
EOS ABS: 0.1 10*3/uL (ref 0.0–0.7)
Eosinophils Relative: 0.8 % (ref 0.0–5.0)
HEMATOCRIT: 43.4 % (ref 39.0–52.0)
HEMOGLOBIN: 14.3 g/dL (ref 13.0–17.0)
LYMPHS PCT: 20.8 % (ref 12.0–46.0)
Lymphs Abs: 1.5 10*3/uL (ref 0.7–4.0)
MCHC: 32.9 g/dL (ref 30.0–36.0)
MCV: 89.7 fl (ref 78.0–100.0)
MONOS PCT: 9.8 % (ref 3.0–12.0)
Monocytes Absolute: 0.7 10*3/uL (ref 0.1–1.0)
NEUTROS ABS: 4.9 10*3/uL (ref 1.4–7.7)
Neutrophils Relative %: 67.9 % (ref 43.0–77.0)
PLATELETS: 232 10*3/uL (ref 150.0–400.0)
RBC: 4.83 Mil/uL (ref 4.22–5.81)
RDW: 14 % (ref 11.5–15.5)
WBC: 7.2 10*3/uL (ref 4.0–10.5)

## 2017-10-31 LAB — BASIC METABOLIC PANEL
BUN: 17 mg/dL (ref 6–23)
CALCIUM: 9.4 mg/dL (ref 8.4–10.5)
CO2: 32 meq/L (ref 19–32)
CREATININE: 1.16 mg/dL (ref 0.40–1.50)
Chloride: 101 mEq/L (ref 96–112)
GFR: 86.78 mL/min (ref >60.00)
GLUCOSE: 107 mg/dL — AB (ref 70–99)
Potassium: 4.6 mEq/L (ref 3.5–5.1)
SODIUM: 138 meq/L (ref 135–145)

## 2017-10-31 LAB — LIPID PANEL
Cholesterol: 168 mg/dL (ref 0–200)
HDL: 45.2 mg/dL (ref >39.00)
LDL Cholesterol: 95 mg/dL (ref 0–99)
NONHDL: 122.51
Total CHOL/HDL Ratio: 4
Triglycerides: 137 mg/dL (ref 0.0–149.0)
VLDL: 27.4 mg/dL (ref 0.0–40.0)

## 2017-10-31 LAB — TSH: TSH: 1.28 u[IU]/mL (ref 0.35–4.50)

## 2017-10-31 LAB — HEMOGLOBIN A1C: Hgb A1c MFr Bld: 6.3 % (ref 4.6–6.5)

## 2017-10-31 MED ORDER — SILDENAFIL CITRATE 100 MG PO TABS: 100.0000 mg | ORAL_TABLET | Freq: Every day | ORAL | 0 refills | Status: DC | PRN

## 2017-10-31 NOTE — Assessment & Plan Note (Signed)
New.  Reviewed that stress and weight gain can both be causes of ED.  Start Viagra prn and monitor for improvement.

## 2017-10-31 NOTE — Patient Instructions (Signed)
Schedule your physical exam for after 5/21 We'll notify you of your lab results and make any changes if needed Continue to work on healthy diet and regular exercise- you can do it!! Use the Viagra as needed- at least 30 minutes prior to activity but can last for 24 hrs Call with any questions or concerns Happy [place] [employer]day!!!

## 2017-10-31 NOTE — Progress Notes (Signed)
   Subjective:    Patient ID: Cameron Kim, male    DOB: April 19, 1971, 47 y.o.   MRN: [id number]  HPI Obesity- pt has gained 17 lbs since last visit.  No regular exercise.  Pt was doing a lot of emotional eating but he is working on this.  Adjustment Disorder- pt's wife left him and he has been understandably having a difficult time.  Not taking his Prozac.  Pt reports he's in a better place.  Pt is now working every day, trying to stay stress free- speaking his mind.  Pt reports he continues to have sleep issues.  Erectile dysfxn- pt reports just recently he has had difficulty w/ this issue.  He is embarrassed to talk about this.   Review of Systems For ROS see HPI     Objective:   Physical Exam  Constitutional: He is oriented to person, place, and time. He appears well-developed and well-nourished. No distress.  obese  HENT:  Head: Normocephalic and atraumatic.  Eyes: Conjunctivae and EOM are normal. Pupils are equal, round, and reactive to light.  Neck: Normal range of motion. Neck supple. No thyromegaly present.  Cardiovascular: Normal rate, regular rhythm, normal heart sounds and intact distal pulses.  No murmur heard. Pulmonary/Chest: Effort normal and breath sounds normal. No respiratory distress.  Abdominal: Soft. Bowel sounds are normal. He exhibits no distension.  Musculoskeletal: He exhibits no edema.  Lymphadenopathy:    He has no cervical adenopathy.  Neurological: He is alert and oriented to person, place, and time. No cranial nerve deficit.  Skin: Skin is warm and dry.  Psychiatric: He has a normal mood and affect. His behavior is normal.  Vitals reviewed.         Assessment & Plan:

## 2017-10-31 NOTE — Assessment & Plan Note (Signed)
Much improved.  Pt is in a better place and doesn't feel that he needs the medication at this time.  Will follow.

## 2017-10-31 NOTE — Assessment & Plan Note (Signed)
Deteriorated.  Pt has gained 17 lbs since last visit.  He is no longer emotionally eating but admits he is not making healthy food choices.  Stressed need for healthy diet and regular exercise.  Check labs to risk stratify.  Will follow.

## 2018-02-27 ENCOUNTER — Other Ambulatory Visit: Payer: Self-pay

## 2018-02-27 ENCOUNTER — Encounter: Payer: Self-pay | Admitting: General Practice

## 2018-02-27 ENCOUNTER — Ambulatory Visit (INDEPENDENT_AMBULATORY_CARE_PROVIDER_SITE_OTHER): Payer: BLUE CROSS/BLUE SHIELD | Admitting: Family Medicine

## 2018-02-27 ENCOUNTER — Encounter: Payer: Self-pay | Admitting: Family Medicine

## 2018-02-27 VITALS — BP 126/82 | HR 84 | Temp 97.7°F | Resp 16 | Ht 72.0 in | Wt 288.6 lb

## 2018-02-27 DIAGNOSIS — Z125 Encounter for screening for malignant neoplasm of prostate: Secondary | ICD-10-CM | POA: Diagnosis not present

## 2018-02-27 DIAGNOSIS — E669 Obesity, unspecified: Secondary | ICD-10-CM | POA: Diagnosis not present

## 2018-02-27 DIAGNOSIS — Z0000 Encounter for general adult medical examination without abnormal findings: Secondary | ICD-10-CM

## 2018-02-27 MED ORDER — SILDENAFIL CITRATE 100 MG PO TABS: 100.0000 mg | ORAL_TABLET | Freq: Every day | ORAL | 6 refills | Status: DC | PRN

## 2018-02-27 NOTE — Assessment & Plan Note (Signed)
Pt's PE WNL w/ exception of obesity. Declined GU exam.  UTD on Tdap.  Too young for colonoscopy.  Applauded his 15 lb weight loss and encouraged him to continue.  Check labs to risk stratify.  Will follow.

## 2018-02-27 NOTE — Patient Instructions (Signed)
Follow up in 6 months to recheck weight loss progress We'll notify you of your lab results and make any changes if needed Continue to work on healthy diet and regular exercise- you're doing great! Call with any questions or concerns Have a great summer!!

## 2018-02-27 NOTE — Progress Notes (Signed)
   Subjective:    Patient ID: Cameron Kim, male    DOB: 1971/07/03, 47 y.o.   MRN: [id number]  HPI CPE- UTD on Tdap.  Too young for colonoscopy.  Pt is down 15 lbs!  Pt reports feeling well.   Review of Systems Patient reports no vision/hearing changes, anorexia, fever ,adenopathy, persistant/recurrent hoarseness, swallowing issues, chest pain, palpitations, edema, persistant/recurrent cough, hemoptysis, dyspnea (rest,exertional, paroxysmal nocturnal), gastrointestinal  bleeding (melena, rectal bleeding), abdominal pain, excessive heart burn, GU symptoms (dysuria, hematuria, voiding/incontinence issues) syncope, focal weakness, memory loss, numbness & tingling, skin/hair/nail changes, depression, anxiety, abnormal bruising/bleeding, musculoskeletal symptoms/signs.     Objective:   Physical Exam General Appearance:    Alert, cooperative, no distress, appears stated age  Head:    Normocephalic, without obvious abnormality, atraumatic  Eyes:    PERRL, conjunctiva/corneas clear, EOM's intact, fundi    benign, both eyes       Ears:    Normal TM's and external ear canals, both ears  Nose:   Nares normal, septum midline, mucosa normal, no drainage   or sinus tenderness  Throat:   Lips, mucosa, and tongue normal; teeth and gums normal  Neck:   Supple, symmetrical, trachea midline, no adenopathy;       thyroid:  No enlargement/tenderness/nodules  Back:     Symmetric, no curvature, ROM normal, no CVA tenderness  Lungs:     Clear to auscultation bilaterally, respirations unlabored  Chest wall:    No tenderness or deformity  Heart:    Regular rate and rhythm, S1 and S2 normal, no murmur, rub   or gallop  Abdomen:     Soft, non-tender, bowel sounds active all four quadrants,    no masses, no organomegaly  Genitalia:    Pt declines  Rectal:    Extremities:   Extremities normal, atraumatic, no cyanosis or edema  Pulses:   2+ and symmetric all extremities  Skin:   Skin color, texture, turgor  normal, no rashes or lesions  Lymph nodes:   Cervical, supraclavicular, and axillary nodes normal  Neurologic:   CNII-XII intact. Normal strength, sensation and reflexes      throughout          Assessment & Plan:

## 2018-02-27 NOTE — Assessment & Plan Note (Signed)
Ongoing issue.  Pt is down 15 lbs since last visit.  Applauded his efforts and encouraged him to continue.  Check labs to risk stratify.  Will follow.

## 2018-02-27 NOTE — Addendum Note (Signed)
Addended by: Marcille Blanco on: 02/27/2018 04:08 PM   Modules accepted: Orders

## 2018-02-28 ENCOUNTER — Telehealth: Payer: Self-pay | Admitting: General Practice

## 2018-02-28 NOTE — Telephone Encounter (Signed)
Tried to begin PA for sildenafil through covermymeds today. The website says patient cannot be found due to incorrect address and phone number. Called pt and left a message to return call to verify that information is updated in our system and with his pharmacy.   Ok for [facility] to Discuss results / PCP recommendations / Schedule patient.

## 2018-09-01 ENCOUNTER — Encounter: Payer: Self-pay | Admitting: General Practice

## 2018-09-01 ENCOUNTER — Ambulatory Visit (INDEPENDENT_AMBULATORY_CARE_PROVIDER_SITE_OTHER): Payer: BLUE CROSS/BLUE SHIELD | Admitting: Family Medicine

## 2018-09-01 ENCOUNTER — Encounter: Payer: Self-pay | Admitting: Family Medicine

## 2018-09-01 ENCOUNTER — Other Ambulatory Visit: Payer: Self-pay

## 2018-09-01 VITALS — BP 136/86 | HR 89 | Temp 98.3°F | Resp 16 | Ht 72.0 in | Wt 264.5 lb

## 2018-09-01 DIAGNOSIS — B351 Tinea unguium: Secondary | ICD-10-CM

## 2018-09-01 DIAGNOSIS — Z23 Encounter for immunization: Secondary | ICD-10-CM

## 2018-09-01 DIAGNOSIS — S60552A Superficial foreign body of left hand, initial encounter: Secondary | ICD-10-CM

## 2018-09-01 DIAGNOSIS — E7439 Other disorders of intestinal carbohydrate absorption: Secondary | ICD-10-CM

## 2018-09-01 DIAGNOSIS — E669 Obesity, unspecified: Secondary | ICD-10-CM

## 2018-09-01 LAB — BASIC METABOLIC PANEL
BUN: 20 mg/dL (ref 6–23)
CHLORIDE: 104 meq/L (ref 96–112)
CO2: 29 meq/L (ref 19–32)
CREATININE: 1.1 mg/dL (ref 0.40–1.50)
Calcium: 9.9 mg/dL (ref 8.4–10.5)
GFR: 91.94 mL/min (ref >60.00)
GLUCOSE: 99 mg/dL (ref 70–99)
Potassium: 5.3 mEq/L — ABNORMAL HIGH (ref 3.5–5.1)
Sodium: 140 mEq/L (ref 135–145)

## 2018-09-01 LAB — HEPATIC FUNCTION PANEL
ALBUMIN: 4.3 g/dL (ref 3.5–5.2)
ALT: 17 U/L (ref 0–53)
AST: 15 U/L (ref 0–37)
Alkaline Phosphatase: 78 U/L (ref 39–117)
Bilirubin, Direct: 0.1 mg/dL (ref 0.0–0.3)
TOTAL PROTEIN: 7.6 g/dL (ref 6.0–8.3)
Total Bilirubin: 0.7 mg/dL (ref 0.2–1.2)

## 2018-09-01 LAB — LIPID PANEL
CHOL/HDL RATIO: 4
Cholesterol: 162 mg/dL (ref 0–200)
HDL: 45.5 mg/dL (ref >39.00)
LDL Cholesterol: 102 mg/dL — ABNORMAL HIGH (ref 0–99)
NonHDL: 116.67
TRIGLYCERIDES: 74 mg/dL (ref 0.0–149.0)
VLDL: 14.8 mg/dL (ref 0.0–40.0)

## 2018-09-01 LAB — CBC WITH DIFFERENTIAL/PLATELET
Basophils Absolute: 0.1 10*3/uL (ref 0.0–0.1)
Basophils Relative: 0.5 % (ref 0.0–3.0)
EOS ABS: 0.1 10*3/uL (ref 0.0–0.7)
Eosinophils Relative: 0.8 % (ref 0.0–5.0)
HCT: 46 % (ref 39.0–52.0)
HEMOGLOBIN: 15.1 g/dL (ref 13.0–17.0)
Lymphocytes Relative: 17.2 % (ref 12.0–46.0)
Lymphs Abs: 1.6 10*3/uL (ref 0.7–4.0)
MCHC: 32.9 g/dL (ref 30.0–36.0)
MCV: 92 fl (ref 78.0–100.0)
MONO ABS: 0.7 10*3/uL (ref 0.1–1.0)
Monocytes Relative: 7.6 % (ref 3.0–12.0)
NEUTROS PCT: 73.9 % (ref 43.0–77.0)
Neutro Abs: 6.9 10*3/uL (ref 1.4–7.7)
Platelets: 236 10*3/uL (ref 150.0–400.0)
RBC: 5 Mil/uL (ref 4.22–5.81)
RDW: 14 % (ref 11.5–15.5)
WBC: 9.4 10*3/uL (ref 4.0–10.5)

## 2018-09-01 LAB — TSH: TSH: 1.05 u[IU]/mL (ref 0.35–4.50)

## 2018-09-01 LAB — HEMOGLOBIN A1C: HEMOGLOBIN A1C: 6 % (ref 4.6–6.5)

## 2018-09-01 NOTE — Patient Instructions (Addendum)
Schedule your complete physical in 6 months We'll notify you of your lab results and make any changes if needed We'll call you with your hand appt APPLY OTC FungiNail as directed on the 2nd toe Keep up the good work on healthy diet and regular exercise- you're looking great!!! Call with any questions or concerns Happy Holidays!!!

## 2018-09-01 NOTE — Assessment & Plan Note (Signed)
Ongoing issue for pt.  He is down 25 lbs since last visit!  Applauded his efforts.  Check labs to risk stratify.  Will follow.

## 2018-09-01 NOTE — Progress Notes (Signed)
   Subjective:    Patient ID: Cameron Kim, male    DOB: 1971/02/11, 47 y.o.   MRN: [id number]  HPI Obesity- pt has lost 25 lbs since last visit.  Pt has a very physical job.  Pt has been eating out more regularly but portions are more reasonable.  No CP, SOB, HAs, visual changes, abd pain, N/V.  Glucose intolerance- pt's last A1C 6.3.  Pt is down 25 lbs since last visit!  BB in L middle finger since age 47.  Finger is now tight, not painful.  L 2nd toe nail fungus- nail is dark and hard/thicker than other nails   Review of Systems For ROS see HPI     Objective:   Physical Exam  Constitutional: He is oriented to person, place, and time. He appears well-developed and well-nourished. No distress.  HENT:  Head: Normocephalic and atraumatic.  Eyes: Pupils are equal, round, and reactive to light. Conjunctivae and EOM are normal.  Neck: Normal range of motion. Neck supple. No thyromegaly present.  Cardiovascular: Normal rate, regular rhythm, normal heart sounds and intact distal pulses.  No murmur heard. Pulmonary/Chest: Effort normal and breath sounds normal. No respiratory distress.  Abdominal: Soft. Bowel sounds are normal. He exhibits no distension.  Musculoskeletal: He exhibits no edema.  Lymphadenopathy:    He has no cervical adenopathy.  Neurological: He is alert and oriented to person, place, and time. No cranial nerve deficit.  Skin: Skin is warm and dry.  Psychiatric: He has a normal mood and affect. His behavior is normal.  Vitals reviewed.         Assessment & Plan:  Foreign body- new to provider, ongoing for pt.  He is now having stiffness b/c of this.  Will refer to hand specialist for evaluation and tx if needed  Toenail fungus- new.  Start w/ OTC topical tx.  If no improvement, can consider systemic tx.  Pt expressed understanding and is in agreement w/ plan.

## 2018-09-01 NOTE — Assessment & Plan Note (Signed)
Last A1C was 6.3  Pt has lost 25 lbs since then.  Check labs.  Continue to encourage healthy diet and regular exercise.

## 2019-03-10 ENCOUNTER — Encounter: Payer: BLUE CROSS/BLUE SHIELD | Admitting: Family Medicine

## 2019-03-10 ENCOUNTER — Encounter: Payer: Self-pay | Admitting: General Practice

## 2019-03-10 ENCOUNTER — Encounter: Payer: Self-pay | Admitting: Family Medicine

## 2019-03-10 ENCOUNTER — Ambulatory Visit (INDEPENDENT_AMBULATORY_CARE_PROVIDER_SITE_OTHER): Payer: BLUE CROSS/BLUE SHIELD | Admitting: Family Medicine

## 2019-03-10 ENCOUNTER — Other Ambulatory Visit: Payer: Self-pay

## 2019-03-10 VITALS — BP 129/83 | HR 76 | Temp 97.9°F | Resp 16 | Ht 74.0 in | Wt 258.2 lb

## 2019-03-10 DIAGNOSIS — E669 Obesity, unspecified: Secondary | ICD-10-CM

## 2019-03-10 DIAGNOSIS — Z0000 Encounter for general adult medical examination without abnormal findings: Secondary | ICD-10-CM | POA: Diagnosis not present

## 2019-03-10 LAB — CBC WITH DIFFERENTIAL/PLATELET
Basophils Absolute: 0.1 10*3/uL (ref 0.0–0.1)
Basophils Relative: 1.2 % (ref 0.0–3.0)
Eosinophils Absolute: 0.1 10*3/uL (ref 0.0–0.7)
Eosinophils Relative: 1 % (ref 0.0–5.0)
HCT: 44.9 % (ref 39.0–52.0)
Hemoglobin: 15.1 g/dL (ref 13.0–17.0)
Lymphocytes Relative: 16.6 % (ref 12.0–46.0)
Lymphs Abs: 1.4 10*3/uL (ref 0.7–4.0)
MCHC: 33.6 g/dL (ref 30.0–36.0)
MCV: 91.8 fl (ref 78.0–100.0)
Monocytes Absolute: 0.7 10*3/uL (ref 0.1–1.0)
Monocytes Relative: 7.7 % (ref 3.0–12.0)
Neutro Abs: 6.4 10*3/uL (ref 1.4–7.7)
Neutrophils Relative %: 73.5 % (ref 43.0–77.0)
Platelets: 202 10*3/uL (ref 150.0–400.0)
RBC: 4.89 Mil/uL (ref 4.22–5.81)
RDW: 13.7 % (ref 11.5–15.5)
WBC: 8.7 10*3/uL (ref 4.0–10.5)

## 2019-03-10 LAB — HEPATIC FUNCTION PANEL
ALT: 18 U/L (ref 0–53)
AST: 18 U/L (ref 0–37)
Albumin: 4.1 g/dL (ref 3.5–5.2)
Alkaline Phosphatase: 66 U/L (ref 39–117)
Bilirubin, Direct: 0.2 mg/dL (ref 0.0–0.3)
Total Bilirubin: 0.9 mg/dL (ref 0.2–1.2)
Total Protein: 7.1 g/dL (ref 6.0–8.3)

## 2019-03-10 LAB — BASIC METABOLIC PANEL
BUN: 17 mg/dL (ref 6–23)
CO2: 30 mEq/L (ref 19–32)
Calcium: 9 mg/dL (ref 8.4–10.5)
Chloride: 103 mEq/L (ref 96–112)
Creatinine, Ser: 1.01 mg/dL (ref 0.40–1.50)
GFR: 95.25 mL/min (ref >60.00)
Glucose, Bld: 91 mg/dL (ref 70–99)
Potassium: 4.5 mEq/L (ref 3.5–5.1)
Sodium: 137 mEq/L (ref 135–145)

## 2019-03-10 LAB — LIPID PANEL
Cholesterol: 156 mg/dL (ref 0–200)
HDL: 46.2 mg/dL (ref >39.00)
LDL Cholesterol: 97 mg/dL (ref 0–99)
NonHDL: 109.99
Total CHOL/HDL Ratio: 3
Triglycerides: 67 mg/dL (ref 0.0–149.0)
VLDL: 13.4 mg/dL (ref 0.0–40.0)

## 2019-03-10 LAB — TSH: TSH: 1.52 u[IU]/mL (ref 0.35–4.50)

## 2019-03-10 NOTE — Patient Instructions (Signed)
Follow up in 1 year or as needed We'll notify you of your lab results and make any changes if needed Continue to work on healthy diet and regular exercise- you can do it!! Call with any questions or concerns Stay Safe!!! 

## 2019-03-10 NOTE — Assessment & Plan Note (Signed)
Ongoing issue for pt.  He is losing weight.  Applauded his efforts at diet and exercise.  Check labs to risk stratify.  Will continue to follow.

## 2019-03-10 NOTE — Assessment & Plan Note (Signed)
Pt's PE WNL w/ exception of obesity.  UTD on immunizations.  Check labs.  Anticipatory guidance provided.  

## 2019-03-10 NOTE — Progress Notes (Signed)
   Subjective:    Patient ID: Cameron Kim, male    DOB: [birth year], 48 y.o.   MRN: [id number]  HPI CPE- UTD on immunizations.  No concerns today.   Review of Systems Patient reports no vision/hearing changes, anorexia, fever ,adenopathy, persistant/recurrent hoarseness, swallowing issues, chest pain, palpitations, edema, persistant/recurrent cough, hemoptysis, dyspnea (rest,exertional, paroxysmal nocturnal), gastrointestinal  bleeding (melena, rectal bleeding), abdominal pain, excessive heart burn, GU symptoms (dysuria, hematuria, voiding/incontinence issues) syncope, focal weakness, memory loss, numbness & tingling, skin/hair/nail changes, depression, anxiety, abnormal bruising/bleeding, musculoskeletal symptoms/signs.     Objective:   Physical Exam General Appearance:    Alert, cooperative, no distress, appears stated age  Head:    Normocephalic, without obvious abnormality, atraumatic  Eyes:    PERRL, conjunctiva/corneas clear, EOM's intact, fundi    benign, both eyes       Ears:    Normal TM's and external ear canals, both ears  Nose:   Nares normal, septum midline, mucosa normal, no drainage   or sinus tenderness  Throat:   Lips, mucosa, and tongue normal; teeth and gums normal  Neck:   Supple, symmetrical, trachea midline, no adenopathy;       thyroid:  No enlargement/tenderness/nodules  Back:     Symmetric, no curvature, ROM normal, no CVA tenderness  Lungs:     Clear to auscultation bilaterally, respirations unlabored  Chest wall:    No tenderness or deformity  Heart:    Regular rate and rhythm, S1 and S2 normal, no murmur, rub   or gallop  Abdomen:     Soft, non-tender, bowel sounds active all four quadrants,    no masses, no organomegaly  Genitalia:    Normal male without lesion, masses,discharge or tenderness  Rectal:    Deferred due to young age  Extremities:   Extremities normal, atraumatic, no cyanosis or edema  Pulses:   2+ and symmetric all extremities  Skin:   Skin  color, texture, turgor normal, no rashes or lesions  Lymph nodes:   Cervical, supraclavicular, and axillary nodes normal  Neurologic:   CNII-XII intact. Normal strength, sensation and reflexes      throughout          Assessment & Plan:

## 2019-03-12 ENCOUNTER — Telehealth: Payer: Self-pay | Admitting: Family Medicine

## 2019-03-12 ENCOUNTER — Encounter: Payer: Self-pay | Admitting: General Practice

## 2019-03-12 NOTE — Telephone Encounter (Signed)
Called pt and LMOVM to find out how he would like to receive note.

## 2019-03-12 NOTE — Telephone Encounter (Signed)
Letter placed at front desk for pt to pick up.

## 2019-03-12 NOTE — Telephone Encounter (Signed)
New note written. Given to PCP for signature. Will call pt to verify how he would like to receive it.

## 2019-03-12 NOTE — Telephone Encounter (Signed)
Please advise on work note     Copied from CRM [id number]. Topic: General - Call Back - No Documentation >> Mar 12, 2019  9:05 AM Randol Kern wrote: Reason for CRM: Requesting signed dates for out of work note. Required by job. Please advise

## 2019-03-18 ENCOUNTER — Encounter: Payer: Self-pay | Admitting: General Practice

## 2019-06-05 ENCOUNTER — Other Ambulatory Visit: Payer: Self-pay

## 2019-06-05 ENCOUNTER — Ambulatory Visit: Payer: BLUE CROSS/BLUE SHIELD

## 2019-06-05 DIAGNOSIS — Z20822 Contact with and (suspected) exposure to covid-19: Secondary | ICD-10-CM

## 2019-06-07 LAB — NOVEL CORONAVIRUS, NAA: SARS-CoV-2, NAA: NOT DETECTED

## 2019-10-09 HISTORY — PX: CARDIAC CATHETERIZATION: SHX172

## 2019-10-23 DIAGNOSIS — Z03818 Encounter for observation for suspected exposure to other biological agents ruled out: Secondary | ICD-10-CM | POA: Diagnosis not present

## 2020-03-11 ENCOUNTER — Encounter: Payer: Self-pay | Admitting: Family Medicine

## 2020-03-11 ENCOUNTER — Ambulatory Visit (INDEPENDENT_AMBULATORY_CARE_PROVIDER_SITE_OTHER): Payer: BC Managed Care – PPO | Admitting: Family Medicine

## 2020-03-11 ENCOUNTER — Encounter: Payer: Self-pay | Admitting: Gastroenterology

## 2020-03-11 ENCOUNTER — Other Ambulatory Visit: Payer: Self-pay

## 2020-03-11 VITALS — BP 126/82 | HR 83 | Temp 97.9°F | Resp 16 | Ht 74.0 in | Wt 256.4 lb

## 2020-03-11 DIAGNOSIS — Z125 Encounter for screening for malignant neoplasm of prostate: Secondary | ICD-10-CM

## 2020-03-11 DIAGNOSIS — E669 Obesity, unspecified: Secondary | ICD-10-CM | POA: Diagnosis not present

## 2020-03-11 DIAGNOSIS — Z1211 Encounter for screening for malignant neoplasm of colon: Secondary | ICD-10-CM | POA: Diagnosis not present

## 2020-03-11 DIAGNOSIS — Z0000 Encounter for general adult medical examination without abnormal findings: Secondary | ICD-10-CM

## 2020-03-11 LAB — CBC WITH DIFFERENTIAL/PLATELET
Basophils Absolute: 0.1 10*3/uL (ref 0.0–0.1)
Basophils Relative: 0.8 % (ref 0.0–3.0)
Eosinophils Absolute: 0.1 10*3/uL (ref 0.0–0.7)
Eosinophils Relative: 0.6 % (ref 0.0–5.0)
HCT: 42.5 % (ref 39.0–52.0)
Hemoglobin: 14.5 g/dL (ref 13.0–17.0)
Lymphocytes Relative: 16.5 % (ref 12.0–46.0)
Lymphs Abs: 1.5 10*3/uL (ref 0.7–4.0)
MCHC: 34.2 g/dL (ref 30.0–36.0)
MCV: 96.4 fl (ref 78.0–100.0)
Monocytes Absolute: 0.8 10*3/uL (ref 0.1–1.0)
Monocytes Relative: 8.3 % (ref 3.0–12.0)
Neutro Abs: 6.9 10*3/uL (ref 1.4–7.7)
Neutrophils Relative %: 73.8 % (ref 43.0–77.0)
Platelets: 174 10*3/uL (ref 150.0–400.0)
RBC: 4.41 Mil/uL (ref 4.22–5.81)
RDW: 13.4 % (ref 11.5–15.5)
WBC: 9.3 10*3/uL (ref 4.0–10.5)

## 2020-03-11 LAB — LIPID PANEL
Cholesterol: 197 mg/dL (ref 0–200)
HDL: 51.8 mg/dL (ref >39.00)
LDL Cholesterol: 129 mg/dL — ABNORMAL HIGH (ref 0–99)
NonHDL: 145.31
Total CHOL/HDL Ratio: 4
Triglycerides: 80 mg/dL (ref 0.0–149.0)
VLDL: 16 mg/dL (ref 0.0–40.0)

## 2020-03-11 LAB — BASIC METABOLIC PANEL
BUN: 17 mg/dL (ref 6–23)
CO2: 32 mEq/L (ref 19–32)
Calcium: 9.4 mg/dL (ref 8.4–10.5)
Chloride: 100 mEq/L (ref 96–112)
Creatinine, Ser: 1.07 mg/dL (ref 0.40–1.50)
GFR: 88.74 mL/min (ref >60.00)
Glucose, Bld: 93 mg/dL (ref 70–99)
Potassium: 4.5 mEq/L (ref 3.5–5.1)
Sodium: 139 mEq/L (ref 135–145)

## 2020-03-11 LAB — HEPATIC FUNCTION PANEL
ALT: 19 U/L (ref 0–53)
AST: 23 U/L (ref 0–37)
Albumin: 4.5 g/dL (ref 3.5–5.2)
Alkaline Phosphatase: 67 U/L (ref 39–117)
Bilirubin, Direct: 0.2 mg/dL (ref 0.0–0.3)
Total Bilirubin: 1.2 mg/dL (ref 0.2–1.2)
Total Protein: 7.4 g/dL (ref 6.0–8.3)

## 2020-03-11 LAB — TSH: TSH: 1.1 u[IU]/mL (ref 0.35–4.50)

## 2020-03-11 LAB — PSA: PSA: 1.53 ng/mL (ref 0.10–4.00)

## 2020-03-11 NOTE — Patient Instructions (Addendum)
Follow up in 1 year or as needed We'll notify you of your lab results and make any changes if needed Continue to work on healthy diet and regular exercise- you can do it! If you decide you want the scalp cyst removed, let me know We'll call you with your GI referral Call with any questions or concerns Have a great summer!

## 2020-03-11 NOTE — Assessment & Plan Note (Signed)
Pt's PE WNL w/ exception of obesity.  UTD on immunizations.  Due for colon cancer screen.  Check labs.  Anticipatory guidance provided.

## 2020-03-11 NOTE — Assessment & Plan Note (Signed)
Ongoing issue for pt.  Stressed need for healthy diet and regular exercise.  Check labs to risk stratify.  Will follow 

## 2020-03-11 NOTE — Progress Notes (Signed)
   Subjective:    Patient ID: Cameron Kim, male    DOB: 25-Jul-1971, 49 y.o.   MRN: [id number]  HPI CPE- UTD on Tdap.  Had Moderna vaccine.  Has some concerns about muscle aches, skin outbreak- feels this is stress related   Review of Systems Patient reports no vision/hearing changes, anorexia, fever ,adenopathy, persistant/recurrent hoarseness, swallowing issues, chest pain, palpitations, edema, persistant/recurrent cough, hemoptysis, dyspnea (rest,exertional, paroxysmal nocturnal), gastrointestinal  bleeding (melena, rectal bleeding), abdominal pain, excessive heart burn, GU symptoms (dysuria, hematuria, voiding/incontinence issues) syncope, focal weakness, memory loss, numbness & tingling, hair/nail changes, depression, anxiety, abnormal bruising/bleeding.   + muscle aches  This visit occurred during the SARS-CoV-2 public health emergency.  Safety protocols were in place, including screening questions prior to the visit, additional usage of staff PPE, and extensive cleaning of exam room while observing appropriate contact time as indicated for disinfecting solutions.       Objective:   Physical Exam General Appearance:    Alert, cooperative, no distress, appears stated age, obese  Head:    Normocephalic, without obvious abnormality, atraumatic, posterior scalp cyst  Eyes:    PERRL, conjunctiva/corneas clear, EOM's intact, fundi    benign, both eyes       Ears:    Normal TM's and external ear canals, both ears  Nose:   Deferred due to COVID  Throat:   Neck:   Supple, symmetrical, trachea midline, no adenopathy;       thyroid:  No enlargement/tenderness/nodules  Back:     Symmetric, no curvature, ROM normal, no CVA tenderness  Lungs:     Clear to auscultation bilaterally, respirations unlabored  Chest wall:    No tenderness or deformity  Heart:    Regular rate and rhythm, S1 and S2 normal, no murmur, rub   or gallop  Abdomen:     Soft, non-tender, bowel sounds active all four  quadrants,    no masses, no organomegaly  Genitalia:    Normal male without lesion, masses,discharge or tenderness  Rectal:    Deferred due to young age  Extremities:   Extremities normal, atraumatic, no cyanosis or edema  Pulses:   2+ and symmetric all extremities  Skin:   Skin color, texture, turgor normal, no rashes or lesions  Lymph nodes:   Cervical, supraclavicular, and axillary nodes normal  Neurologic:   CNII-XII intact. Normal strength, sensation and reflexes      throughout          Assessment & Plan:

## 2020-04-05 DIAGNOSIS — R0609 Other forms of dyspnea: Secondary | ICD-10-CM | POA: Diagnosis not present

## 2020-04-05 DIAGNOSIS — R0902 Hypoxemia: Secondary | ICD-10-CM | POA: Diagnosis not present

## 2020-04-05 DIAGNOSIS — R4182 Altered mental status, unspecified: Secondary | ICD-10-CM | POA: Diagnosis not present

## 2020-04-05 DIAGNOSIS — R609 Edema, unspecified: Secondary | ICD-10-CM | POA: Diagnosis not present

## 2020-04-05 DIAGNOSIS — R52 Pain, unspecified: Secondary | ICD-10-CM | POA: Diagnosis not present

## 2020-04-05 DIAGNOSIS — R569 Unspecified convulsions: Secondary | ICD-10-CM | POA: Diagnosis not present

## 2020-04-05 DIAGNOSIS — S0231XA Fracture of orbital floor, right side, initial encounter for closed fracture: Secondary | ICD-10-CM | POA: Diagnosis not present

## 2020-04-05 DIAGNOSIS — R42 Dizziness and giddiness: Secondary | ICD-10-CM | POA: Diagnosis not present

## 2020-04-06 ENCOUNTER — Encounter: Payer: Self-pay | Admitting: Family Medicine

## 2020-04-06 ENCOUNTER — Other Ambulatory Visit: Payer: Self-pay

## 2020-04-06 ENCOUNTER — Ambulatory Visit (INDEPENDENT_AMBULATORY_CARE_PROVIDER_SITE_OTHER): Payer: BC Managed Care – PPO | Admitting: Family Medicine

## 2020-04-06 VITALS — BP 133/81 | HR 79 | Temp 98.3°F | Resp 16 | Ht 74.0 in | Wt 254.2 lb

## 2020-04-06 DIAGNOSIS — S0230XA Fracture of orbital floor, unspecified side, initial encounter for closed fracture: Secondary | ICD-10-CM | POA: Diagnosis not present

## 2020-04-06 DIAGNOSIS — R569 Unspecified convulsions: Secondary | ICD-10-CM
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
## 2020-04-06 NOTE — Patient Instructions (Signed)
Follow up as needed or as scheduled We'll call you with your Neurology and ENT appts Continue to ICE!!!! Alternate tylenol and ibuprofen for pain relief Call with any questions or concerns Hang in there!!!

## 2020-04-07 DIAGNOSIS — S0231XA Fracture of orbital floor, right side, initial encounter for closed fracture: Secondary | ICD-10-CM | POA: Diagnosis not present

## 2020-04-07 DIAGNOSIS — J343 Hypertrophy of nasal turbinates: Secondary | ICD-10-CM | POA: Diagnosis not present

## 2020-04-07 DIAGNOSIS — S0230XA Fracture of orbital floor, unspecified side, initial encounter for closed fracture: Secondary | ICD-10-CM | POA: Insufficient documentation

## 2020-04-13 ENCOUNTER — Ambulatory Visit (INDEPENDENT_AMBULATORY_CARE_PROVIDER_SITE_OTHER): Payer: BC Managed Care – PPO | Admitting: Neurology

## 2020-04-13 ENCOUNTER — Encounter: Payer: Self-pay | Admitting: Neurology

## 2020-04-13 ENCOUNTER — Other Ambulatory Visit: Payer: Self-pay

## 2020-04-13 VITALS — BP 132/84 | HR 85 | Ht 74.0 in | Wt 259.5 lb

## 2020-04-13 DIAGNOSIS — R569 Unspecified convulsions: Secondary | ICD-10-CM | POA: Diagnosis not present
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
## 2020-04-15 ENCOUNTER — Telehealth: Payer: Self-pay | Admitting: *Deleted

## 2020-04-15 NOTE — Telephone Encounter (Signed)
Called patient, patient reports the seizure like activity was because he fell and hit his head. Pt unsure if he had a seizure, he wants to call his neurologist to see if he can be cleared to have the colonoscopy because he reports that he was cleared to return to work without restrictions. Patient will call us back after he speaks with the neurologist.  Pt's EEG is scheduled for 05/02/20.

## 2020-04-19 ENCOUNTER — Telehealth: Payer: Self-pay | Admitting: Neurology

## 2020-04-19 ENCOUNTER — Telehealth: Payer: Self-pay | Admitting: Family Medicine

## 2020-04-19 NOTE — Telephone Encounter (Signed)
John,    Can you please review this pt's chart - seizure 6-29- 21- Neuro Cleared please look at neuro notes as well and advise. Thanks so much - Hilda Lias

## 2020-04-19 NOTE — Telephone Encounter (Signed)
I called pt and advise him to seek him PCP about clearance for a colonoscopy. Pt stated its already schedule and he did not want them to cancel the order. Pt verbalized understanding.

## 2020-04-19 NOTE — Telephone Encounter (Signed)
Called and advised pt of PCP recommendations. Also advised that it appeared Neuro was reviewing his chart and would give final ok to GI and that he needed to keep all pre-procedure appointments.

## 2020-04-19 NOTE — Telephone Encounter (Signed)
I don't have any messages from GI regarding this.  If they are concerned about clearance for his procedure, clearance would need to come from Neuro

## 2020-04-19 NOTE — Telephone Encounter (Signed)
Patient called about colonoscopy that he is supposed to have this week.  He said that they are questioning him to be sure that he is medically cleared to have this procedure.  Patient would like you to call him.

## 2020-04-19 NOTE — Telephone Encounter (Signed)
This patient had a recent seizure type event.  He is undergoing work-up, but I see no significant contraindication to getting a colonoscopy.  The patient will have IV access during the procedure, if he does have a seizure they can administer IV medication such as Keppra.  The physician performing the procedure needs to be aware of his history.

## 2020-04-19 NOTE — Telephone Encounter (Signed)
   This pt is scheduled for a colonoscopy 05-06-2020- he was contacted by one of our Pre Visit nurses concerning a seizure he recently had- Pt has called to get clearance from neurology , neurology said to get clearance from PCP , PCP, Tabori said get clearance from Neurology.  Pt has a Pre Visit 7-15 with [place]. ( GI)  Does this pt need an OV to obtain clearance for his colon?  We are just unclear as to who should give the clearance.  Thanks so much for your time and attention to this matter,   Bufford Spikes RN Pre Visit  [facility] - GI

## 2020-04-19 NOTE — Telephone Encounter (Signed)
Marie,  Proceed with colon  Cleared for colon by neurology Still, pl run it be Cathlyn Parsons to make sure it is OK from anesthesia standpoint.  RG

## 2020-04-19 NOTE — Telephone Encounter (Signed)
Dr Chales Abrahams,  AS Doc of the Day can you please review this pt's chart and advise as he has a PV in 2 days, Thanks so very much, Hilda Lias PV

## 2020-04-19 NOTE — Telephone Encounter (Signed)
Pt is asking if Dr Anne Hahn will approve him having a colonoscopy on 07-30

## 2020-04-19 NOTE — Telephone Encounter (Signed)
Please advise, have you received anything?

## 2020-04-19 NOTE — Telephone Encounter (Signed)
no to the covid questoins MR Brain w/wo contrast Dr. Lockie Mola Auth: NPR Ref # [id number]. Patient is scheduled at [facility] for 04/20/20.

## 2020-04-19 NOTE — Telephone Encounter (Signed)
I called Cameron Kim nurse to clairfy what clearance the MD needs. I sated pt was only seen one time by Dr. Anne Hahn on 04/13/2020 and has EEG schedule on 05/02/2020 and MRI schedule tomorrow .I stated the work up has not been completed by our office. I also stated message has been sent to Dr Epimenio Foot for review. Lanora Manis verbalized understanding and will let the MD know.

## 2020-04-19 NOTE — Telephone Encounter (Signed)
Dr Myrtie Neither,  Lorain Childes-  There is a telephone encounter in this patients chart from today that Dr Anne Hahn states he would not restrict pt from having a colon.   Please review and let us know if OV needed. Thanks,Marie

## 2020-04-19 NOTE — Telephone Encounter (Signed)
Cameron Kim,  Due to new onset seizures this pt's elective colonoscopy should be delayed at least six months until neurology has completed his w/u and, iif medications are begun, allow his tx to be established.  Thanks,  Cathlyn Parsons

## 2020-04-19 NOTE — Telephone Encounter (Signed)
Dr Myrtie Neither,  This pt is scheduled to have a colonoscopy with you 05-06-20- Robbin noted with chart prep he had a recent seizure and called pt to request clearance from Neuro due to seizure - pt has been cleared to work, etc from episode per pt - pt saw Neuro , Dr Anne Hahn 7-7 with note below from that visit :   [name] is a 49 year old right-handed black male with a history of a seizure type event that occurred on 05 April 2020.  The patient had just completed jury duty, he was leaving and then suddenly had bilateral tinnitus.  He then began to become lightheaded and then suddenly blacked out.  He sustained a mild right orbital fracture and fractured some of the teeth on the right.  He believes he may have hit a fire hydrant when he fell.  He apparently had a witnessed generalized seizure event that lasted about a minute.  He underwent a CT scan of the brain which demonstrated the orbital fracture but otherwise was unremarkable.  Blood work was unremarkable, urine drug screen was positive for THC but otherwise negative.  The patient has a history of a prior seizure that occurred on 16 August 2015.  This was a witnessed seizure at home, the patient had been using cocaine around the time of the event.  A CT scan at that time was unremarkable.  The patient reports no family history of seizures.  With the most recent event, he did not bite his tongue but he did lose control of the bladder.  The patient denies any palpitations of the heart.  He denies any focal numbness or weakness of the face, arms, legs.  He is sent to this office for further evaluation.   1.  Seizure type event  The patient has had an unprovoked event that appears to be consistent with a seizure.  The prior event in 2016 was associated with cocaine use.  We will set the patient up for MRI of the brain and an EEG study.  He is not to operate a motor vehicle, heavy equipment, or climb to heights within the next 6 months.  He will follow up  here in 6 months.  He works in [employer] but is on the ground floor level, he can perform this job if he can get back and forth to the job site.   Marlan Palau MD 04/13/2020 3:25 PM   He has an EEG scheduled for 05-02-2020-   Neuro office told pt he needs clearance from PCP, PCP Dr Beverely Low said clearance from Neuro - I spoke with Neuro nurse and she said she will have Dr Epimenio Foot- ( Dr Anne Hahn out of the office )  look at pt's chart and he can give a clearance based on the 7-7 neuro note ONLY  but without full evaluation ( EEG 7-26).   Do you want to postpone his colon due to the above issues?   Does he need an OV with you prior-?  please advise and thanks for your time ,  Elizebeth Brooking

## 2020-04-20 ENCOUNTER — Other Ambulatory Visit: Payer: Self-pay

## 2020-04-20 ENCOUNTER — Encounter: Payer: Self-pay | Admitting: Neurology

## 2020-04-20 ENCOUNTER — Ambulatory Visit: Payer: BC Managed Care – PPO

## 2020-04-20 DIAGNOSIS — R569 Unspecified convulsions: Secondary | ICD-10-CM

## 2020-04-20 MED ORDER — GADOBENATE DIMEGLUMINE 529 MG/ML IV SOLN
20.0000 mL | Freq: Once | INTRAVENOUS | Status: AC | PRN
Start: 2020-04-20 — End: 2020-04-20
  Administered 2020-04-20: 20 mL via INTRAVENOUS

## 2020-04-20 NOTE — Telephone Encounter (Signed)
Spoke with the patient. I gave him the recommendations from Cathlyn Parsons, CRNA. Patient is aware the PV and colon has been cancelled at this time and recall colon for 6 months has been placed in Epic.

## 2020-04-22 ENCOUNTER — Telehealth: Payer: Self-pay | Admitting: Neurology

## 2020-04-22 MED ORDER — LEVETIRACETAM 500 MG PO TABS
ORAL_TABLET | ORAL | 3 refills | Status: DC
Start: 2020-04-22 — End: 2020-11-07

## 2020-04-22 NOTE — Telephone Encounter (Signed)
  I called the patient.  MRI of the brain shows gliosis of the left anterior temporal region, given this finding in combination with a clinical history of seizures, I would go ahead and initiate treatment with Keppra.  EEG study looks like it has been done but I do not yet have the report, I will read the study next week.  MRI brain 04/21/20:  IMPRESSION:   MRI brain (with and without) demonstrating: - Left anterior temporal focus of T2 flair hyperintensity (1.3cm) could be posttraumatic gliosis vs other non-specific gliosis.  - No abnormal enhancing lesions.  No acute findings.

## 2020-04-25 NOTE — Telephone Encounter (Signed)
Thank you for the note. I was out of the office last week. This is a routine screening colonoscopy, so I agree with canceling colonoscopy now. Recall January 2022.

## 2020-05-02 ENCOUNTER — Encounter: Payer: Self-pay | Admitting: Neurology

## 2020-05-02 ENCOUNTER — Ambulatory Visit (INDEPENDENT_AMBULATORY_CARE_PROVIDER_SITE_OTHER): Payer: BC Managed Care – PPO | Admitting: Diagnostic Neuroimaging

## 2020-05-02 ENCOUNTER — Other Ambulatory Visit: Payer: Self-pay

## 2020-05-02 DIAGNOSIS — R569 Unspecified convulsions: Secondary | ICD-10-CM | POA: Diagnosis not present

## 2020-05-05 ENCOUNTER — Telehealth: Payer: Self-pay | Admitting: Neurology

## 2020-05-05 NOTE — Telephone Encounter (Signed)
I have attempted to reach this patient three times. There was no answer so I was unable to further assess the situation. I was able to leave a message w/ the first call (ok per DPR). I instructed him to proceed for emergency room or urgent care to be evaluated. On the following calls, his mailbox was full.

## 2020-05-05 NOTE — Procedures (Signed)
   [facility]  EEG (ELECTROENCEPHALOGRAM) REPORT   STUDY DATE: 05/02/20 PATIENT NAME: Cameron Kim DOB: 09-28-1971 MRN: [id number]  ORDERING CLINICIAN: York Spaniel, MD   TECHNOLOGIST: Charlett Blake  TECHNIQUE: Electroencephalogram was recorded utilizing standard 10-20 system of lead placement and reformatted into average and bipolar montages.  RECORDING TIME: 29 minutes  ACTIVATION: hyperventilation and photic stimulation  CLINICAL INFORMATION: 49 year old male with seizure.  FINDINGS: Posterior dominant background rhythms, which attenuate with eye opening, ranging 10-11 hertz and 30-40 microvolts.  Theta and rare delta wave slowing are noted during drowsiness.  Intermittent sharply contoured waves are noted in the bitemporal regions during drowsiness, but not definitely epileptiform. No focal, lateralizing, epileptiform activity or seizures are seen. Patient recorded in the awake and drowsy state. EKG channel shows regular rhythm of 60-65 beats per minute.   IMPRESSION:   Normal EEG in the awake and drowsy states.     INTERPRETING PHYSICIAN:  Suanne Marker, MD Certified in Neurology, Neurophysiology and Neuroimaging  [facility] [address] [place], [place] [address] [phone]

## 2020-05-05 NOTE — Telephone Encounter (Signed)
Pt want to have a nurse to call him. Pt having blood drainage in his throat, blood coming out of his nose and taste blood.

## 2020-05-06 ENCOUNTER — Encounter: Payer: BC Managed Care – PPO | Admitting: Gastroenterology

## 2020-05-06 ENCOUNTER — Telehealth: Payer: Self-pay | Admitting: Neurology

## 2020-05-06 DIAGNOSIS — S0285XS Fracture of orbit, unspecified, sequela: Secondary | ICD-10-CM

## 2020-05-06 NOTE — Telephone Encounter (Signed)
I called the patient.  The EEG study was unremarkable, but MRI of the brain showed a temporal lobe lesion that could be epileptogenic, he was placed on Keppra but he has not yet started the medication.  I urged him to get on the Keppra.  The patient indicates that a month out from the initial event, he is still having blood draining from his right nostril, and draining down the throat.  He is concerned about this issue.  He denies any significant sinus pain.  I will go ahead and get an ENT referral concerning this.  The patient did suffer a right orbital floor fracture with involvement in the infra orbital foramen.

## 2020-06-15 DIAGNOSIS — R040 Epistaxis: Secondary | ICD-10-CM | POA: Insufficient documentation

## 2020-06-15 DIAGNOSIS — S0230XA Fracture of orbital floor, unspecified side, initial encounter for closed fracture: Secondary | ICD-10-CM | POA: Diagnosis not present

## 2020-10-08 DIAGNOSIS — I10 Essential (primary) hypertension: Secondary | ICD-10-CM | POA: Diagnosis not present

## 2020-10-08 DIAGNOSIS — E669 Obesity, unspecified: Secondary | ICD-10-CM | POA: Diagnosis not present

## 2020-10-08 DIAGNOSIS — Z20822 Contact with and (suspected) exposure to covid-19: Secondary | ICD-10-CM | POA: Diagnosis not present

## 2020-10-08 DIAGNOSIS — Z6833 Body mass index (BMI) 33.0-33.9, adult: Secondary | ICD-10-CM | POA: Diagnosis not present

## 2020-11-07 ENCOUNTER — Ambulatory Visit (INDEPENDENT_AMBULATORY_CARE_PROVIDER_SITE_OTHER): Payer: BC Managed Care – PPO | Admitting: Neurology

## 2020-11-07 ENCOUNTER — Encounter: Payer: Self-pay | Admitting: Neurology

## 2020-11-07 DIAGNOSIS — G40909 Epilepsy, unspecified, not intractable, without status epilepticus: Secondary | ICD-10-CM | POA: Diagnosis not present

## 2020-11-07 HISTORY — DX: Epilepsy, unspecified, not intractable, without status epilepticus: G40.909

## 2020-11-07 MED ORDER — LEVETIRACETAM 500 MG PO TABS: 500.0000 mg | ORAL_TABLET | Freq: Two times a day (BID) | ORAL | 3 refills | Status: DC

## 2020-11-07 NOTE — Progress Notes (Signed)
Reason for visit: Seizures  Cameron Kim is an 50 y.o. male  History of present illness:  Cameron Kim is a 50 year old right-handed black male with a history of seizures.  The patient had his first seizure in 2016 associated with use of cocaine, but he had an unprovoked seizure on 05 April 2020.  EEG study was unremarkable, but MRI of the brain showed evidence of gliosis of the anterior left temporal area and for this reason he was placed on Keppra.  The patient is on 500 mg twice daily, he seems to be doing well with this with some minimal irritability but no significant drowsiness.  The patient has not had any recurrent seizure events.  He works in [employer], but mainly works on a ground-level with this.  The patient returns to the office today for further evaluation.  Past Medical History:  Diagnosis Date  . Abnormal EKG   . Ankle fracture, left   . Peripheral vascular disease (HCC) 2013   dvt-pe after ankle fx  . Pulmonary embolism (HCC) 05/2012    Past Surgical History:  Procedure Laterality Date  . I & D EXTREMITY Right 06/07/2016   Procedure: IRRIGATION AND DEBRIDEMENT RIGHT WRIST,MEDIAN NERVE EXPLORATION NEUROLYSIS AND REPAIR;  Surgeon: Dominica Severin, MD;  Location: [facility] OR;  Service: Orthopedics;  Laterality: Right;  . medial nerve surgry right arm    . NO PAST SURGERIES      Family History  Problem Relation Age of Onset  . Diabetes Mother   . Hypertension Mother   . Heart murmur Mother   . Diabetes Father   . Hypertension Father   . Diabetes Paternal Grandmother   . Diabetes Paternal Grandfather     Social history:  reports that he has never smoked. He has never used smokeless tobacco. He reports current alcohol use of about 3.0 standard drinks of alcohol per week. He reports that he does not use drugs.   No Known Allergies  Medications:  Prior to Admission medications   Medication Sig Start Date End Date Taking? Authorizing Provider  levETIRAcetam (KEPPRA)  500 MG tablet 1 tablet at night for 1 week, then start 1 tablet twice daily 04/22/20  Yes Cameron Spaniel, MD    ROS:  Out of a complete 14 system review of symptoms, the patient complains only of the following symptoms, and all other reviewed systems are negative.  Seizures  Height 6\' 2"  (1.88 m), weight 254 lb (115.2 kg).  Physical Exam  General: The patient is alert and cooperative at the time of the examination.  Skin: No significant peripheral edema is noted.   Neurologic Exam  Mental status: The patient is alert and oriented x 3 at the time of the examination. The patient has apparent normal recent and remote memory, with an apparently normal attention span and concentration ability.   Cranial nerves: Facial symmetry is present. Speech is normal, no aphasia or dysarthria is noted. Extraocular movements are full. Visual fields are full.  Motor: The patient has good strength in all 4 extremities.  Sensory examination: Soft touch sensation is symmetric on the face, arms, and legs.  Coordination: The patient has good finger-nose-finger and heel-to-shin bilaterally.  Gait and station: The patient has a normal gait. Tandem gait is normal. Romberg is negative. No drift is seen.  Reflexes: Deep tendon reflexes are symmetric.    MRI brain 04/21/20:  IMPRESSION:   MRI brain (with and without) demonstrating: - Left anterior temporal focus of T2  flair hyperintensity (1.3cm) could be posttraumatic gliosis vs other non-specific gliosis.  - No abnormal enhancing lesions. No acute findings.  * MRI scan images were reviewed online. I agree with the written report.   EEG 05/02/20:  IMPRESSION:   Normal EEG in the awake and drowsy states.      Assessment/Plan:  1.  History of seizures  The patient has been placed on antiepileptic medication because of the MRI abnormality in the history of 2 prior seizures.  At this point, the patient has no restrictions with driving  or with work.  The patient will continue the Keppra taking 500 mg twice daily, he was given a prescription for this.  He will follow-up in 1 year or sooner if needed.  Marlan Palau MD 11/07/2020 7:32 AM  [facility] [address] [place], [place] [address]  Phone [phone] Fax [phone]

## 2021-03-13 ENCOUNTER — Ambulatory Visit (INDEPENDENT_AMBULATORY_CARE_PROVIDER_SITE_OTHER): Payer: BC Managed Care – PPO | Admitting: Family Medicine

## 2021-03-13 ENCOUNTER — Other Ambulatory Visit: Payer: Self-pay

## 2021-03-13 ENCOUNTER — Encounter: Payer: Self-pay | Admitting: Family Medicine

## 2021-03-13 VITALS — BP 122/80 | HR 75 | Temp 97.3°F | Resp 17 | Ht 72.5 in | Wt 242.8 lb

## 2021-03-13 DIAGNOSIS — Z1211 Encounter for screening for malignant neoplasm of colon: Secondary | ICD-10-CM

## 2021-03-13 DIAGNOSIS — Z125 Encounter for screening for malignant neoplasm of prostate: Secondary | ICD-10-CM

## 2021-03-13 DIAGNOSIS — Z0000 Encounter for general adult medical examination without abnormal findings: Secondary | ICD-10-CM

## 2021-03-13 DIAGNOSIS — R1032 Left lower quadrant pain: Secondary | ICD-10-CM

## 2021-03-13 DIAGNOSIS — M2550 Pain in unspecified joint: Secondary | ICD-10-CM

## 2021-03-13 DIAGNOSIS — E669 Obesity, unspecified: Secondary | ICD-10-CM

## 2021-03-13 LAB — CBC WITH DIFFERENTIAL/PLATELET
Basophils Absolute: 0.1 10*3/uL (ref 0.0–0.1)
Basophils Relative: 1.1 % (ref 0.0–3.0)
Eosinophils Absolute: 0.3 10*3/uL (ref 0.0–0.7)
Eosinophils Relative: 2.7 % (ref 0.0–5.0)
HCT: 45.2 % (ref 39.0–52.0)
Hemoglobin: 15.2 g/dL (ref 13.0–17.0)
Lymphocytes Relative: 12.3 % (ref 12.0–46.0)
Lymphs Abs: 1.1 10*3/uL (ref 0.7–4.0)
MCHC: 33.7 g/dL (ref 30.0–36.0)
MCV: 94.3 fl (ref 78.0–100.0)
Monocytes Absolute: 0.8 10*3/uL (ref 0.1–1.0)
Monocytes Relative: 8.2 % (ref 3.0–12.0)
Neutro Abs: 7.1 10*3/uL (ref 1.4–7.7)
Neutrophils Relative %: 75.7 % (ref 43.0–77.0)
Platelets: 205 10*3/uL (ref 150.0–400.0)
RBC: 4.79 Mil/uL (ref 4.22–5.81)
RDW: 12.7 % (ref 11.5–15.5)
WBC: 9.4 10*3/uL (ref 4.0–10.5)

## 2021-03-13 LAB — PSA: PSA: 1.29 ng/mL (ref 0.10–4.00)

## 2021-03-13 LAB — TSH: TSH: 1.46 u[IU]/mL (ref 0.35–4.50)

## 2021-03-13 NOTE — Assessment & Plan Note (Signed)
Pt's PE WNL.  UTD on COVID, Tdap.  Due for colonoscopy- referral placed.  Check labs.  Anticipatory guidance provided.

## 2021-03-13 NOTE — Patient Instructions (Addendum)
Follow up in 1 year or as needed We'll notify you of your lab results and make any changes if needed We'll call you with your surgery and sports med referrals We'll call you with your GI referral for the colonoscopy Continue to work on healthy diet and regular exercise- you're doing great!!! Call with any questions or concerns Stay Safe!  Stay Healthy! Have a great summer!!!

## 2021-03-13 NOTE — Progress Notes (Signed)
   Subjective:    Patient ID: Cameron Kim, male    DOB: [birth year], 50 y.o.   MRN: [id number]  HPI CPE- due to start colonoscopy.  UTD on Tdap.  UTD on COVID and booster  Reviewed past medical, surgical, family and social histories.   Health Maintenance  Topic Date Due  . COVID-19 Vaccine (1) Never done  . COLONOSCOPY (Pts 45-49yrs Insurance coverage will need to be confirmed)  Never done  . Zoster Vaccines- Shingrix (1 of 2) Never done  . Hepatitis C Screening  03/13/2022 (Originally [birth year]24/1988)  . HIV Screening  03/13/2022 (Originally 10/24/1985)  . INFLUENZA VACCINE  05/08/2021  . TETANUS/TDAP  12/03/2024  . Pneumococcal Vaccine 69-37 Years old  Aged Out  . HPV VACCINES  Aged Out      Review of Systems Patient reports no vision/hearing changes, anorexia, fever ,adenopathy, persistant/recurrent hoarseness, swallowing issues, palpitations, edema, persistant/recurrent cough, hemoptysis, dyspnea (rest,exertional, paroxysmal nocturnal), gastrointestinal  bleeding (melena, rectal bleeding), abdominal pain, excessive heart burn, GU symptoms (dysuria, hematuria, voiding/incontinence issues) syncope, focal weakness, memory loss, skin/hair/nail changes, depression, anxiety, abnormal bruising/bleeding, musculoskeletal symptoms/signs.   + 11 lb weight loss- pt is working at it + rare deep CP + carpal tunnel L groin pain  This visit occurred during the SARS-CoV-2 public health emergency.  Safety protocols were in place, including screening questions prior to the visit, additional usage of staff PPE, and extensive cleaning of exam room while observing appropriate contact time as indicated for disinfecting solutions.       Objective:   Physical Exam General Appearance:    Alert, cooperative, no distress, appears stated age  Head:    Normocephalic, without obvious abnormality, atraumatic  Eyes:    PERRL, conjunctiva/corneas clear, EOM's intact, fundi    benign, both eyes       Ears:    Normal  TM's and external ear canals, both ears  Nose:   Deferred due to COVID  Throat:   Neck:   Supple, symmetrical, trachea midline, no adenopathy;       thyroid:  No enlargement/tenderness/nodules  Back:     Symmetric, no curvature, ROM normal, no CVA tenderness  Lungs:     Clear to auscultation bilaterally, respirations unlabored  Chest wall:    No tenderness or deformity  Heart:    Regular rate and rhythm, S1 and S2 normal, no murmur, rub   or gallop  Abdomen:     Soft, non-tender, bowel sounds active all four quadrants,    no masses, no organomegaly, no obvious hernia in L groin  Genitalia:    deferred  Rectal:    Extremities:   Extremities normal, atraumatic, no cyanosis or edema  Pulses:   2+ and symmetric all extremities  Skin:   Skin color, texture, turgor normal, no rashes or lesions  Lymph nodes:   Cervical, supraclavicular, and axillary nodes normal  Neurologic:   CNII-XII intact. Normal strength, sensation and reflexes      throughout          Assessment & Plan:

## 2021-03-13 NOTE — Assessment & Plan Note (Signed)
Improved.  Pt has lost 11 lbs since last visit.  Applauded his efforts at healthy diet and regular exercise.  Check labs to risk stratify.  Will follow.

## 2021-03-14 ENCOUNTER — Telehealth: Payer: Self-pay | Admitting: Family Medicine

## 2021-03-14 LAB — LIPID PANEL
Cholesterol: 170 mg/dL (ref 0–200)
HDL: 44.6 mg/dL (ref >39.00)
LDL Cholesterol: 107 mg/dL — ABNORMAL HIGH (ref 0–99)
NonHDL: 125.22
Total CHOL/HDL Ratio: 4
Triglycerides: 93 mg/dL (ref 0.0–149.0)
VLDL: 18.6 mg/dL (ref 0.0–40.0)

## 2021-03-14 LAB — HEPATIC FUNCTION PANEL
ALT: 17 U/L (ref 0–53)
AST: 22 U/L (ref 0–37)
Albumin: 3.9 g/dL (ref 3.5–5.2)
Alkaline Phosphatase: 65 U/L (ref 39–117)
Bilirubin, Direct: 0.1 mg/dL (ref 0.0–0.3)
Total Bilirubin: 0.9 mg/dL (ref 0.2–1.2)
Total Protein: 7 g/dL (ref 6.0–8.3)

## 2021-03-14 LAB — BASIC METABOLIC PANEL
BUN: 14 mg/dL (ref 6–23)
CO2: 28 mEq/L (ref 19–32)
Calcium: 9 mg/dL (ref 8.4–10.5)
Chloride: 102 mEq/L (ref 96–112)
Creatinine, Ser: 1.09 mg/dL (ref 0.40–1.50)
GFR: 79.2 mL/min (ref >60.00)
Glucose, Bld: 88 mg/dL (ref 70–99)
Potassium: 4.4 mEq/L (ref 3.5–5.1)
Sodium: 139 mEq/L (ref 135–145)

## 2021-03-14 NOTE — Telephone Encounter (Signed)
Called patient back with lab results. Patient voiced understanding.

## 2021-03-14 NOTE — Telephone Encounter (Signed)
Patient said he was returning your call - please call back

## 2021-03-15 NOTE — Progress Notes (Signed)
I, Christoper Fabian, LAT, ATC, am serving as scribe for Dr. Clementeen Graham.  Subjective:    I'm seeing this patient as a consultation for: Dr. Neena Rhymes. Note will be routed back to referring provider/PCP.  CC: L elbow, R shoulder and R knee pain  HPI: Pt is a 50 y/o RHD male c/o pain in multiple joints. Pt locates pain to his L elbow, R shoulder and R knee pain.  L elbow pain: Pain x 2-3 weeks w/ no known MOI.  He works in [employer] and does a lot of heavy lifting.   -Aggravating factors: L forearm supination/pronation; gripping/lifting activities; laying on L side -Treatments tried: nothing  R shoulder: Initial injury about 15 years ago when he had a R shoulder overflexion injury in which he was hanging by his R arm from a window.  He has had no new MOI.  He locates his pain to deep inside his R shoulder. -Aggravating: R shoulder rotation/circumduction  R knee: Pain x one year intermittently.  He locates his pain to his R ant knee.   -Aggravating factors: kneeling; prolonged standing/walking -Swelling: intermittently yes but not currently   Swelling: Aggravates: Treatments tried:  Dx imaging: 04/20/20 Brain MRI  05/15/16 R forearm XR  08/16/15 R shoulder XR  05/20/14 L-spine & c-spine XR   Past medical history, Surgical history, Family history, Social history, Allergies, and medications have been entered into the medical record, reviewed.   Review of Systems: No new headache, visual changes, nausea, vomiting, diarrhea, constipation, dizziness, abdominal pain, skin rash, fevers, chills, night sweats, weight loss, swollen lymph nodes, body aches, joint swelling, muscle aches, chest pain, shortness of breath, mood changes, visual or auditory hallucinations.   Objective:    Vitals:   03/16/21 1359  BP: 130/74  Pulse: 91  SpO2: 94%   General: Well Developed, well nourished, and in no acute distress.  Neuro/Psych: Alert and oriented x3, extra-ocular muscles intact, able to  move all 4 extremities, sensation grossly intact. Skin: Warm and dry, no rashes noted.  Respiratory: Not using accessory muscles, speaking in full sentences, trachea midline.  Cardiovascular: Pulses palpable, no extremity edema. Abdomen: Does not appear distended. MSK: Right shoulder normal-appearing Nontender midline. Normal shoulder motion some pain with abduction. Positive Hawkins and Neer's test.  Positive empty can test. Negative Yergason's and speeds test.  Left elbow normal-appearing Nontender normal elbow motion flexion extension. Some pain with pronation supination. No pain with resisted wrist motion. Pulses cap refill and sensation are intact distally.   Knees bilaterally normal.  Normal motion with minimal crepitation.  Stable ligaments exam intact strength.  Lab and Radiology Results   Procedure: Real-time Ultrasound Guided Injection of her right shoulder subacromial bursa Device: Philips Affiniti 50G Images permanently stored and available for review in PACS  Ultrasound evaluation prior to injection reveals subacromial bursitis without rotator cuff tear. Verbal informed consent obtained.  Discussed risks and benefits of procedure. Warned about infection bleeding damage to structures skin hypopigmentation and fat atrophy among others. Patient expresses understanding and agreement Time-out conducted.   Noted no overlying erythema, induration, or other signs of local infection.   Skin prepped in a sterile fashion.   Local anesthesia: Topical Ethyl chloride.   With sterile technique and under real time ultrasound guidance: 40 mg of Kenalog and 2 mL of Marcaine injected into subacromial bursa. Fluid seen entering the bursa.   Completed without difficulty   Pain immediately resolved suggesting accurate placement of the medication.   Advised  to call if fevers/chills, erythema, induration, drainage, or persistent bleeding.   Images permanently stored and available for  review in the ultrasound unit.  Impression: Technically successful ultrasound guided injection.  Diagnostic Limited MSK Ultrasound of: Left elbow Lateral epicondyle.  Tiny avulsion fleck at superficial portion of the lateral epicondyle.  Intact extensor tendons. Medial epicondyle normal-appearing No joint effusion Impression: Minimal lateral epicondylitis signs    X-ray images left elbow obtained today personally and independently interpreted No significant degenerative changes.  No loose bodies visible.  No acute fractures.  Tiny osteophyte lateral epicondyle Await formal radiology review   Impression and Recommendations:    Assessment and Plan: 50 y.o. male with right shoulder pain due to subacromial bursitis.  Plan for subacromial steroid injection today and home exercise program and recheck in about 6 weeks..  Left elbow pain: Unclear etiology.  Main pain generator seems to be pronation supination motion.  Work on eccentric exercises targeting these motions and Voltaren gel.  Reassess in 6 weeks.  If not improved consider advanced imaging.  May benefit from dedicated physical therapy.  Knees bilaterally mild degenerative changes likely.  Back burner issue today.  Voltaren gel trial.  PDMP not reviewed this encounter. Orders Placed This Encounter  Procedures   [place] LIMITED JOINT SPACE STRUCTURES UP LEFT(NO LINKED CHARGES)    Order Specific Question:   Reason for Exam (SYMPTOM  OR DIAGNOSIS REQUIRED)    Answer:   L elbow pain    Order Specific Question:   Preferred imaging location?    Answer:   Adult nurse [facility] ELBOW COMPLETE LEFT (3+VIEW)    Standing Status:   Future    Number of Occurrences:   1    Standing Expiration Date:   03/16/2022    Order Specific Question:   Reason for Exam (SYMPTOM  OR DIAGNOSIS REQUIRED)    Answer:   ebal elbow pain    Order Specific Question:   Preferred imaging location?    Answer:   [facility]   No orders of  the defined types were placed in this encounter.   Discussed warning signs or symptoms. Please see discharge instructions. Patient expresses understanding.   The above documentation has been reviewed and is accurate and complete Clementeen Graham, M.D.

## 2021-03-16 ENCOUNTER — Other Ambulatory Visit: Payer: Self-pay

## 2021-03-16 ENCOUNTER — Ambulatory Visit (INDEPENDENT_AMBULATORY_CARE_PROVIDER_SITE_OTHER): Payer: BC Managed Care – PPO

## 2021-03-16 ENCOUNTER — Encounter: Payer: Self-pay | Admitting: Family Medicine

## 2021-03-16 ENCOUNTER — Ambulatory Visit: Payer: Self-pay

## 2021-03-16 ENCOUNTER — Ambulatory Visit (INDEPENDENT_AMBULATORY_CARE_PROVIDER_SITE_OTHER): Payer: BC Managed Care – PPO | Admitting: Family Medicine

## 2021-03-16 VITALS — BP 130/74 | HR 91 | Ht 72.5 in | Wt 241.0 lb

## 2021-03-16 DIAGNOSIS — M25511 Pain in right shoulder: Secondary | ICD-10-CM

## 2021-03-16 DIAGNOSIS — G8929 Other chronic pain: Secondary | ICD-10-CM | POA: Diagnosis not present

## 2021-03-16 DIAGNOSIS — M25561 Pain in right knee: Secondary | ICD-10-CM | POA: Diagnosis not present

## 2021-03-16 DIAGNOSIS — M25522 Pain in left elbow: Secondary | ICD-10-CM

## 2021-03-16 IMAGING — DX DG ELBOW COMPLETE 3+V*L*
4 series · 4 of 4 positions shown · non-contrast
Comparison: None.

CLINICAL DATA: 50-year-old male with left elbow pain.

EXAM:
LEFT ELBOW - COMPLETE 3+ VIEW

[elbow ap]
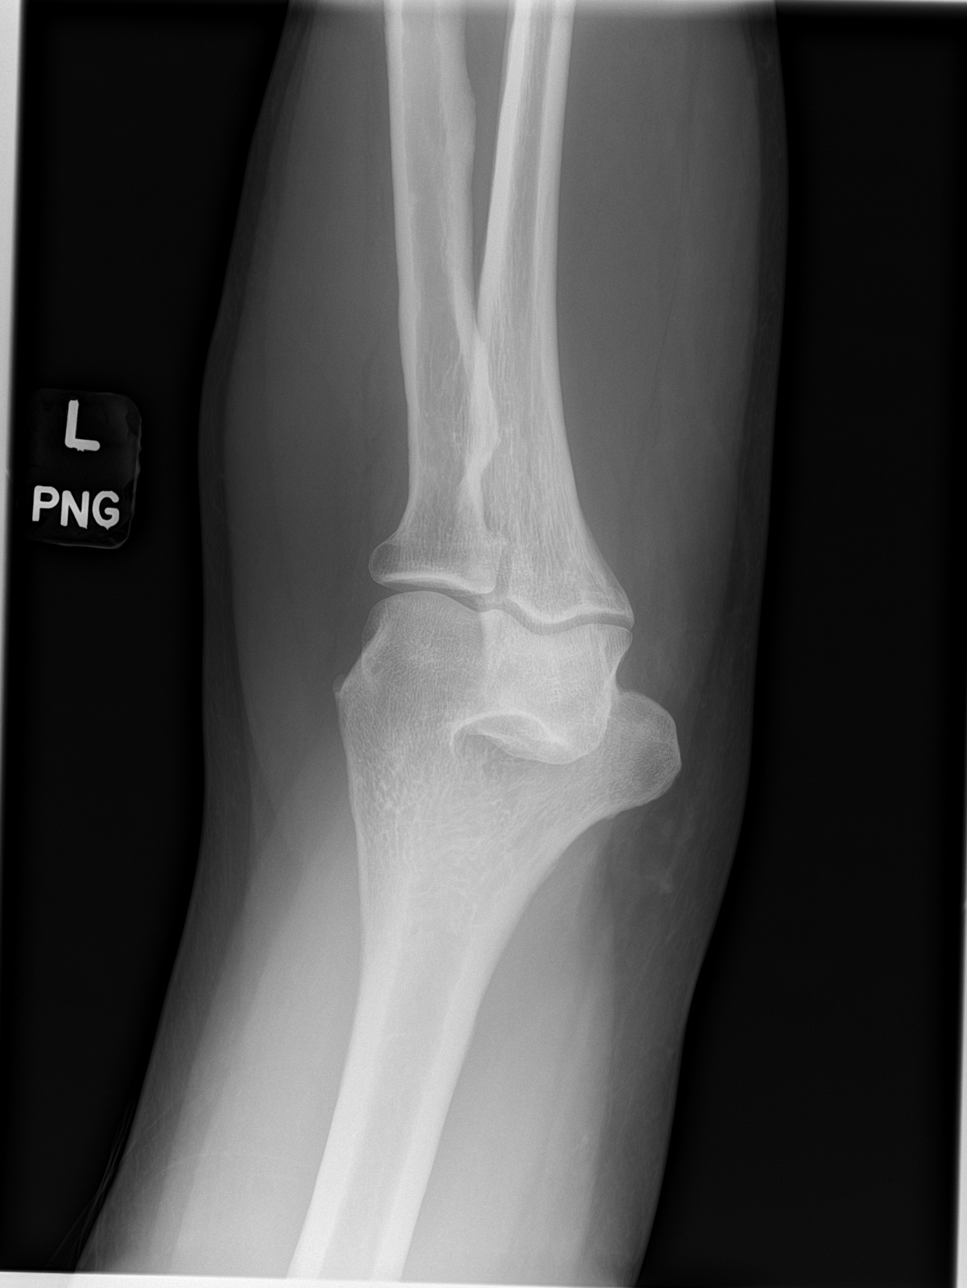

[elbow obl (1 of 2)]
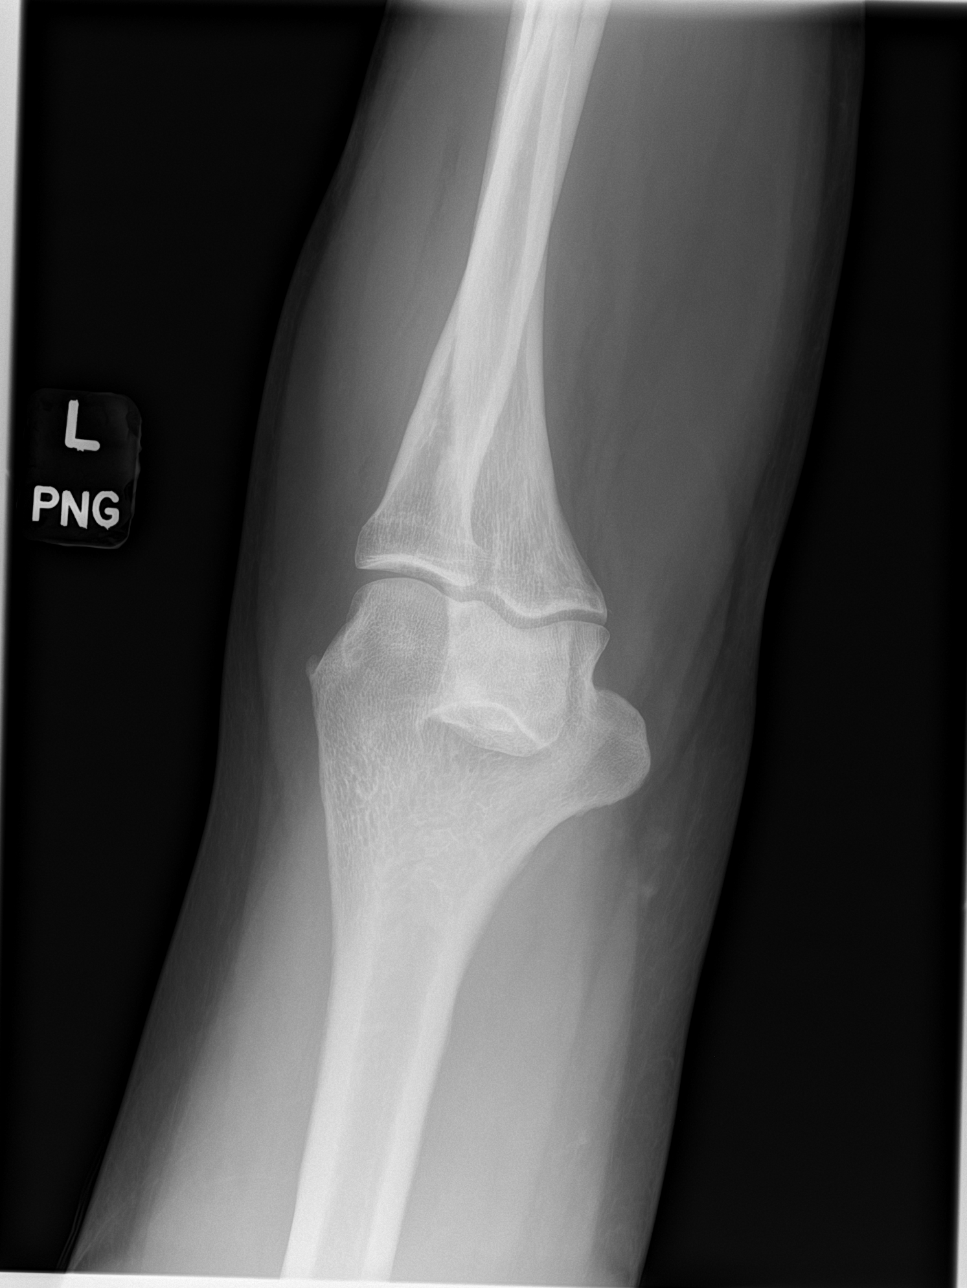

[elbow obl (2 of 2)]
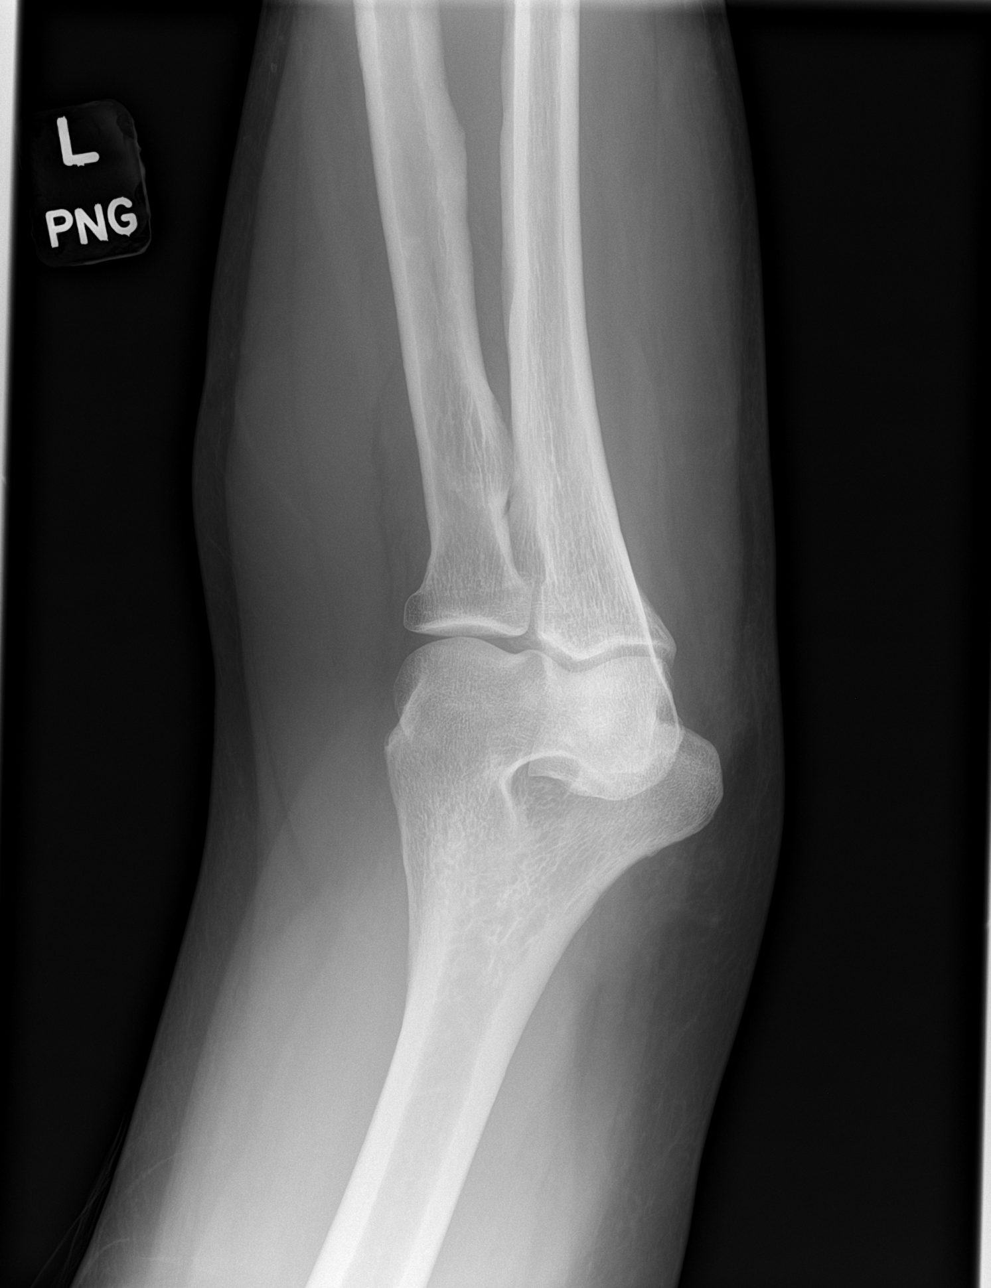

[elbow lat]
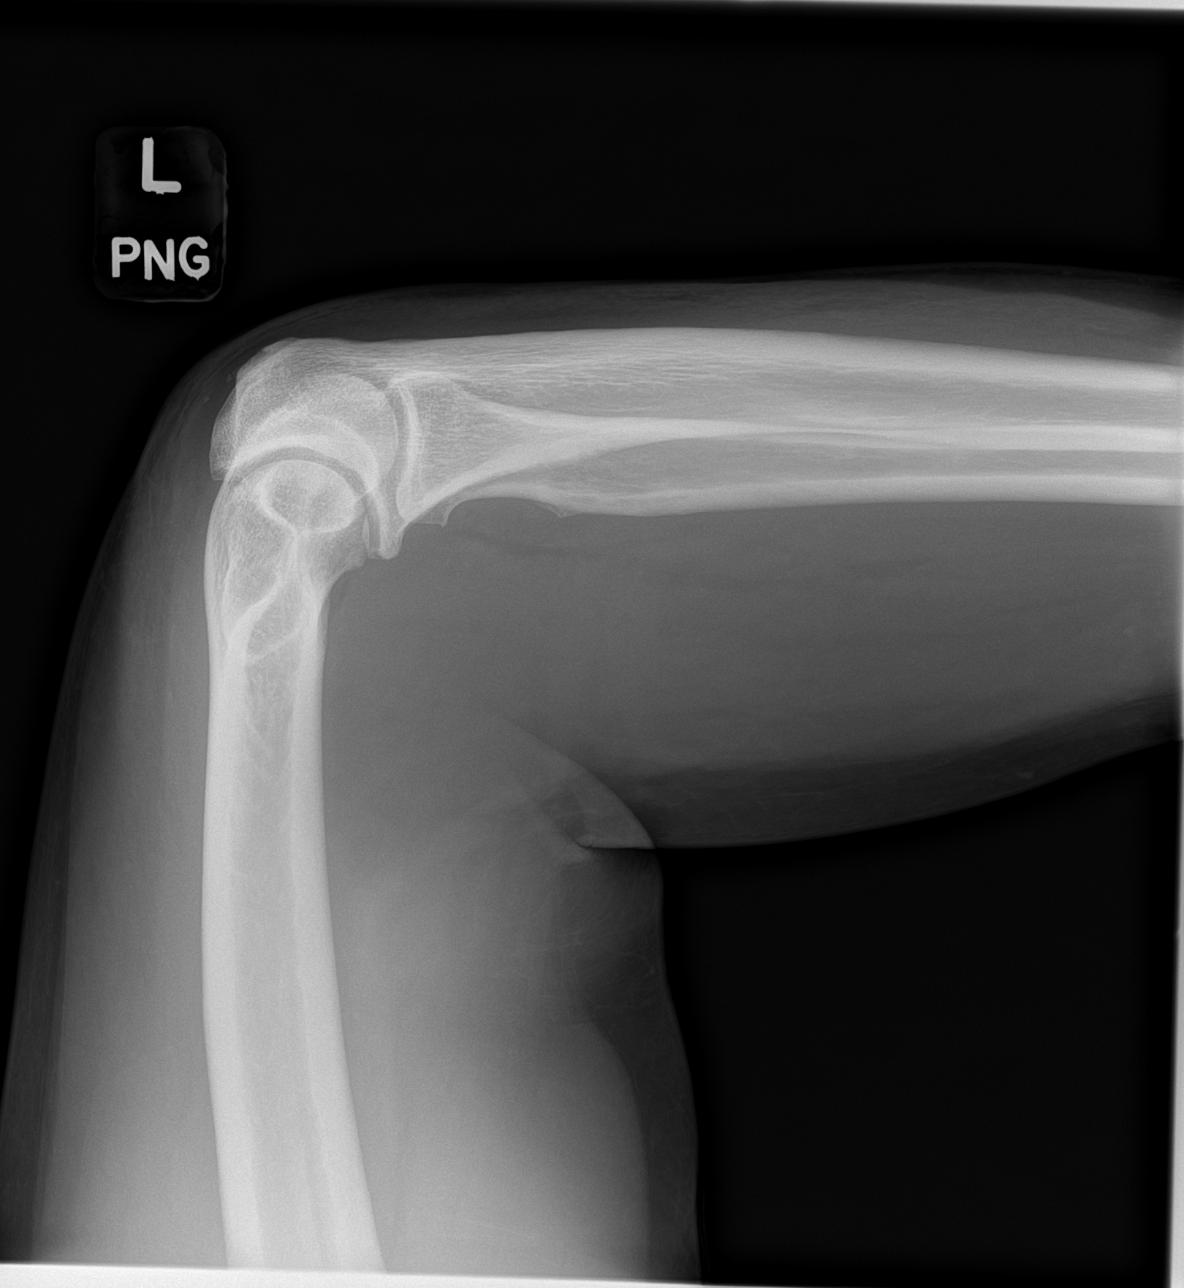

[4 of 4 positions shown; findings below may reference images not displayed]

FINDINGS: There is no acute fracture or dislocation. The bones are well
mineralized. No significant arthritic changes. No joint effusion.
The soft tissues are unremarkable.
IMPRESSION: Negative.

## 2021-03-16 NOTE — Patient Instructions (Addendum)
Thank you for coming in today.   You had a R shoulder injection today.  Call or go to the ER if you develop a large red swollen joint with extreme pain or oozing puss.    Please perform the exercise program that we have prepared for you and gone over in detail on a daily basis.  In addition to the handout you were provided you can access your program through: [email or web address]   Your unique program code is:  [email or web address]  Please get an Xray today before you leave   Please use Voltaren gel (Generic Diclofenac Gel) up to 4x daily for pain as needed.  This is available over-the-counter as both the name brand Voltaren gel and the generic diclofenac gel.   Please follow-up in 6 weeks.

## 2021-03-20 ENCOUNTER — Other Ambulatory Visit: Payer: Self-pay

## 2021-03-20 NOTE — Progress Notes (Signed)
Left elbow looks normal to radiology

## 2021-03-20 NOTE — Progress Notes (Signed)
Spoke w/ pt and conveyed Dr. Corey's result note. Pt verbalized understanding.  

## 2021-04-05 ENCOUNTER — Encounter: Payer: Self-pay | Admitting: *Deleted

## 2021-04-17 ENCOUNTER — Ambulatory Visit (HOSPITAL_COMMUNITY)
Admission: RE | Admit: 2021-04-17 | Discharge: 2021-04-17 | Disposition: A | Discharge status: Home / Self Care | Payer: BC Managed Care – PPO | Source: Ambulatory Visit | Attending: Family Medicine | Admitting: Family Medicine

## 2021-04-17 ENCOUNTER — Encounter: Payer: Self-pay | Admitting: Family Medicine

## 2021-04-17 ENCOUNTER — Ambulatory Visit (INDEPENDENT_AMBULATORY_CARE_PROVIDER_SITE_OTHER): Payer: BC Managed Care – PPO | Admitting: Family Medicine

## 2021-04-17 ENCOUNTER — Other Ambulatory Visit: Payer: Self-pay

## 2021-04-17 VITALS — BP 138/88 | HR 89 | Temp 97.7°F | Resp 18 | Ht 72.05 in | Wt 240.6 lb

## 2021-04-17 DIAGNOSIS — R319 Hematuria, unspecified: Secondary | ICD-10-CM

## 2021-04-17 DIAGNOSIS — R310 Gross hematuria: Secondary | ICD-10-CM | POA: Diagnosis not present

## 2021-04-17 DIAGNOSIS — M79662 Pain in left lower leg: Secondary | ICD-10-CM | POA: Diagnosis not present

## 2021-04-17 DIAGNOSIS — R252 Cramp and spasm: Secondary | ICD-10-CM | POA: Diagnosis not present

## 2021-04-17 DIAGNOSIS — I824Z2 Acute embolism and thrombosis of unspecified deep veins of left distal lower extremity: Secondary | ICD-10-CM

## 2021-04-17 LAB — POCT URINALYSIS DIPSTICK OB
Bilirubin, UA: NEGATIVE
Blood, UA: NEGATIVE
Glucose, UA: NEGATIVE
Ketones, UA: NEGATIVE
Leukocytes, UA: NEGATIVE
Nitrite, UA: NEGATIVE
POC,PROTEIN,UA: NEGATIVE
Spec Grav, UA: 1.02 (ref 1.010–1.025)
Urobilinogen, UA: 0.2 E.U./dL
pH, UA: 6 (ref 5.0–8.0)

## 2021-04-17 MED ORDER — APIXABAN 5 MG PO TABS: ORAL_TABLET | ORAL | 0 refills | Status: DC

## 2021-04-17 NOTE — Patient Instructions (Addendum)
Follow up as needed or as scheduled We'll notify you of your lab results and make any changes if needed THANKFULLY, there was no blood in the urine today.  PLEASE keep an eye on things and if it happens again, let me know and we'll refer to urology INCREASE your water and electrolyte intake.  These super hot and humid days will do a number on your muscles and cause cramps We'll let you know the results of your ultrasound ASAP Call with any questions or concerns Hang in there!!

## 2021-04-17 NOTE — Addendum Note (Signed)
Addended by: Meredith Staggers R on: 04/17/2021 06:41 PM   Modules accepted: Orders

## 2021-04-17 NOTE — Progress Notes (Addendum)
Call from vascular lab.  DVT in popliteal vein, as well as in gastroc veins and short saphenous. No progression above knee.  No other deep structure involvement.  I will review report and advise pt further later this afternoon.   Report: Summary:  RIGHT:  - No evidence of common femoral vein obstruction.     LEFT:  - Findings consistent with acute deep vein thrombosis involving the left  popliteal vein, and left gastrocnemius veins.  - Findings consistent with acute superficial vein thrombosis involving the  left small saphenous vein.    - No cystic structure found in the popliteal fossa.   6:25 PM Discussed with results with pt.  Calf pain about a month ago, improved, then recurred recently.  Has been having chest pain past year, no new chest pain or new dyspnea, no recent chest pain or dyspnea, and feeling better recently. DVT treatment discussed, start Eliquis.  Bleeding precautions discussed including if any recurrence of hematuria.  Initial prescription Eliquis sent, forwarded to PCP for further instructions or follow-up plan.

## 2021-04-17 NOTE — Progress Notes (Signed)
Subjective:    Patient ID: Cameron Kim, male    DOB: 11-02-1970, 50 y.o.   MRN: [id number]  HPI L leg pain- pt reports he had pain in his calf, 'like a Charley horse'.  Pt reports it took 3 weeks for pain to improve and after that, he noticed some tightness in his chest.  Pain has again returned.  Feels a knot in L posterior calf.  Also notes cramping of hands/feet.  + SOB when going upstairs.  Pt reports actually feeling better today  Hematuria- pt noticed red on the toilet seat when he was urinating on Saturday.  Has not noticed any additional blood.  No dysuria, frequency, urgency, suprapubic pain  Review of Systems For ROS see HPI   This visit occurred during the SARS-CoV-2 public health emergency.  Safety protocols were in place, including screening questions prior to the visit, additional usage of staff PPE, and extensive cleaning of exam room while observing appropriate contact time as indicated for disinfecting solutions.      Objective:   Physical Exam Vitals reviewed.  Constitutional:      General: He is not in acute distress.    Appearance: Normal appearance. He is not ill-appearing.  HENT:     Head: Normocephalic and atraumatic.  Eyes:     Extraocular Movements: Extraocular movements intact.     Conjunctiva/sclera: Conjunctivae normal.     Pupils: Pupils are equal, round, and reactive to light.  Cardiovascular:     Rate and Rhythm: Normal rate and regular rhythm.     Pulses: Normal pulses.     Heart sounds: Normal heart sounds.  Pulmonary:     Effort: Pulmonary effort is normal. No respiratory distress.     Breath sounds: Normal breath sounds. No wheezing or rales.  Abdominal:     General: Abdomen is flat. There is no distension.     Palpations: Abdomen is soft.     Tenderness: There is no abdominal tenderness. There is no guarding or rebound.  Musculoskeletal:        General: Tenderness (TTP w/ palpable cord L calf) present.     Right lower leg: No edema.      Left lower leg: No edema.  Skin:    General: Skin is warm and dry.  Neurological:     General: No focal deficit present.     Mental Status: He is alert and oriented to person, place, and time.  Psychiatric:        Mood and Affect: Mood normal.        Behavior: Behavior normal.        Thought Content: Thought content normal.          Assessment & Plan:   Pain in L calf- new.  Pt previously had DVT in this leg after ankle surgery.  He reports L calf is tender and tight.  Palpable cord.  He is fearful of repeat DVT.  Will get [place] to assess.  Pt expressed understanding and is in agreement w/ plan.   Extremity cramps- new.  Suspect this is due to the fact that pt works in a [employer] and is losing fluids and electrolytes in his sweat.  Check labs and supplement K+ and Mg++ as needed.  Encouraged increased water and salt.  Pt expressed understanding and is in agreement w/ plan.   Gross hematuria- pt states he saw blood on toilet seat after urinating.  Denies dysuria, frequency, urgency.  No blood on UA  today.  Pt to monitor and if repeat occurrence will refer to urology.  Pt expressed understanding and is in agreement w/ plan.

## 2021-04-18 ENCOUNTER — Other Ambulatory Visit: Payer: Self-pay

## 2021-04-18 ENCOUNTER — Telehealth: Payer: Self-pay

## 2021-04-18 ENCOUNTER — Telehealth: Payer: Self-pay | Admitting: Family Medicine

## 2021-04-18 DIAGNOSIS — I824Z2 Acute embolism and thrombosis of unspecified deep veins of left distal lower extremity: Secondary | ICD-10-CM

## 2021-04-18 LAB — BASIC METABOLIC PANEL
BUN: 14 mg/dL (ref 6–23)
CO2: 30 mEq/L (ref 19–32)
Calcium: 8.8 mg/dL (ref 8.4–10.5)
Chloride: 101 mEq/L (ref 96–112)
Creatinine, Ser: 1.1 mg/dL (ref 0.40–1.50)
GFR: 78.29 mL/min (ref >60.00)
Glucose, Bld: 98 mg/dL (ref 70–99)
Potassium: 3.9 mEq/L (ref 3.5–5.1)
Sodium: 138 mEq/L (ref 135–145)

## 2021-04-18 LAB — MAGNESIUM: Magnesium: 1.9 mg/dL (ref 1.5–2.5)

## 2021-04-18 MED ORDER — RIVAROXABAN (XARELTO) VTE STARTER PACK (15 & 20 MG): ORAL_TABLET | ORAL | 0 refills | Status: DC

## 2021-04-18 NOTE — Telephone Encounter (Signed)
Patient called stating that one of the blood thinners that was sent in to pharmacy is not covered by his insurance and that Dr Neva Seat said it was another that he could use but was not sure of the name of the med as he would like to try and see if that would be covered. Please advise

## 2021-04-18 NOTE — Telephone Encounter (Signed)
Called and left patient a message on his vm  that per Dr Beverely Low that she has sent in Xarelto to his pharmacy.

## 2021-04-18 NOTE — Telephone Encounter (Signed)
CaseId:[id number];Status:Approved;Review Type:Prior Auth;Coverage Start Date:03/19/2021;Coverage End Date:04/18/2022;  Prior authorization obtained. Patient notified via voicemail message.

## 2021-04-18 NOTE — Telephone Encounter (Signed)
Pt needs prior authorization on RIVAROXABAN (XARELTO) VTE STARTER PACK (15 & 20 MG) [[id number]] [id number] - [place], [place] - [address] AT [facility] [address] [place] [address]  Insurance will not cover with out authorization   Pt call back [phone]

## 2021-04-18 NOTE — Telephone Encounter (Signed)
Patient called and said that the blood thinner that was sent in yesterday is not covered by his insurance.  Patient needs the other blood thinner sent in.  Dr. Neva Seat said there was 2 different ones that they could use.  Patient does not know the name of the blood thinner.  He  needs this one sent to [facility] in [place], [place] -   [address]

## 2021-04-18 NOTE — Telephone Encounter (Signed)
Since Eliquis isn't covered, I will send in Xarelto for pt.

## 2021-04-19 ENCOUNTER — Telehealth: Payer: Self-pay | Admitting: Physician Assistant

## 2021-04-19 NOTE — Telephone Encounter (Signed)
Received a new hem referral from Dr. Beverely Low for DVT. Mr. Cameron Kim returned my call and has been scheduled to see [name] on 7/26 at 9am. Pt aware to arrive 20 minutes early.

## 2021-04-20 ENCOUNTER — Encounter: Payer: Self-pay | Admitting: Gastroenterology

## 2021-04-26 NOTE — Progress Notes (Signed)
I, Christoper Fabian, LAT, ATC, am serving as scribe for Dr. Clementeen Graham.  Cameron Kim is a 50 y.o. male who presents to [facility] today for f/u of L elbow, R shoulder and R knee pain.  He was last seen by Dr. Denyse Amass on 03/16/21 and had a R subacromial injection and was shown a HEP for his L elbow focusing on eccentric strengthening.  Since his last visit, pt reports that the R subacromial injection helped his R shoulder.  He reports that his L elbow and L forearm are the same and he is now having similar pain in his R forearm.  He also con't to c/o B knee pain that is intermittent in nature.  He does report being diagnosed w/ a blood clot/DVT in his L calf in the L popliteal vein and superficial small saphenous vein and has been started on Xarelto.  Diagnostic testing: L elbow XR- 03/16/21; R shoulder XR- 08/16/15  Pertinent review of systems: No fevers or chills  Relevant historical information: Seizure disorder.   Exam:  BP 120/60 (BP Location: Right Arm, Patient Position: Sitting, Cuff Size: Large)   Pulse 91   Ht 6' (1.829 m)   Wt 245 lb 3.2 oz (111.2 kg)   SpO2 96%   BMI 33.26 kg/m  General: Well Developed, well nourished, and in no acute distress.   MSK: Bilateral forearm and elbow normal-appearing Mildly tender palpation anterior portion of radial forearm. Some pain with pronation supination.  No significant pain with flexion extension. No significant pain with wrist extension or flexion.  No masses palpated.   Not point tender at insertion of biceps tendon or at lateral epicondyle.  Pulses capillary refill and sensation are intact distally.   Lab and Radiology Results No results found for this or any previous visit (from the past 72 hour(s)). DG ELBOW COMPLETE RIGHT (3+VIEW)  Result Date: 04/27/2021 CLINICAL DATA:  Right elbow pain for 6 months. EXAM: RIGHT ELBOW - COMPLETE 3+ VIEW COMPARISON:  None. FINDINGS: There is no evidence of fracture,  dislocation, or joint effusion. Normal joint spaces and alignment. There is a small olecranon spur. There is no evidence of arthropathy or other focal bone abnormality. Soft tissues are unremarkable. IMPRESSION: Small olecranon spur. Otherwise unremarkable radiographs of the right elbow. Electronically Signed   By: Narda Rutherford M.D.   On: 04/27/2021 23:57    EXAM: LEFT ELBOW - COMPLETE 3+ VIEW   COMPARISON:  None.   FINDINGS: There is no acute fracture or dislocation. The bones are well mineralized. No significant arthritic changes. No joint effusion. The soft tissues are unremarkable.   IMPRESSION: Negative.     Electronically Signed   By: Elgie Collard M.D.   On: 03/19/2021 14:55  I, Clementeen Graham, personally (independently) visualized and performed the interpretation of the images attached in this note.    Assessment and Plan: 50 y.o. male with bilateral forearm pain.  Etiology is unclear.  The region of pain would indicate this could be possibly related to the insertion of the pronator or perhaps the insertion of the distal biceps tendon the source of pain.  However is not entirely clear.  The pain does seem to be generated by excessive grip and forearm or wrist motion.  He is not doing well with home exercise program.  Discussed options.  Plan for formal hand PT trial.  If not doing well would recommend that it is imaging such as MRI.   PDMP  not reviewed this encounter. Orders Placed This Encounter  Procedures   DG ELBOW COMPLETE RIGHT (3+VIEW)    Standing Status:   Future    Number of Occurrences:   1    Standing Expiration Date:   04/27/2022    Order Specific Question:   Reason for Exam (SYMPTOM  OR DIAGNOSIS REQUIRED)    Answer:   eval elbow pain r    Order Specific Question:   Preferred imaging location?    Answer:   [facility]   Ambulatory referral to Physical Therapy    Referral Priority:   Routine    Referral Type:   Physical Medicine    Referral  Reason:   Specialty Services Required    Requested Specialty:   Physical Therapy    Number of Visits Requested:   1   No orders of the defined types were placed in this encounter.    Discussed warning signs or symptoms. Please see discharge instructions. Patient expresses understanding.   The above documentation has been reviewed and is accurate and complete Clementeen Graham, M.D.

## 2021-04-27 ENCOUNTER — Encounter: Payer: Self-pay | Admitting: Family Medicine

## 2021-04-27 ENCOUNTER — Other Ambulatory Visit: Payer: Self-pay

## 2021-04-27 ENCOUNTER — Ambulatory Visit (INDEPENDENT_AMBULATORY_CARE_PROVIDER_SITE_OTHER): Payer: BC Managed Care – PPO

## 2021-04-27 ENCOUNTER — Ambulatory Visit (INDEPENDENT_AMBULATORY_CARE_PROVIDER_SITE_OTHER): Payer: BC Managed Care – PPO | Admitting: Family Medicine

## 2021-04-27 VITALS — BP 120/60 | HR 91 | Ht 72.0 in | Wt 245.2 lb

## 2021-04-27 DIAGNOSIS — M25539 Pain in unspecified wrist: Secondary | ICD-10-CM

## 2021-04-27 DIAGNOSIS — M25521 Pain in right elbow: Secondary | ICD-10-CM | POA: Diagnosis not present

## 2021-04-27 IMAGING — DX DG ELBOW COMPLETE 3+V*R*
4 series · 4 of 4 positions shown · non-contrast
Comparison: None.

CLINICAL DATA: Right elbow pain for 6 months.

EXAM:
RIGHT ELBOW - COMPLETE 3+ VIEW

[elbow ap]
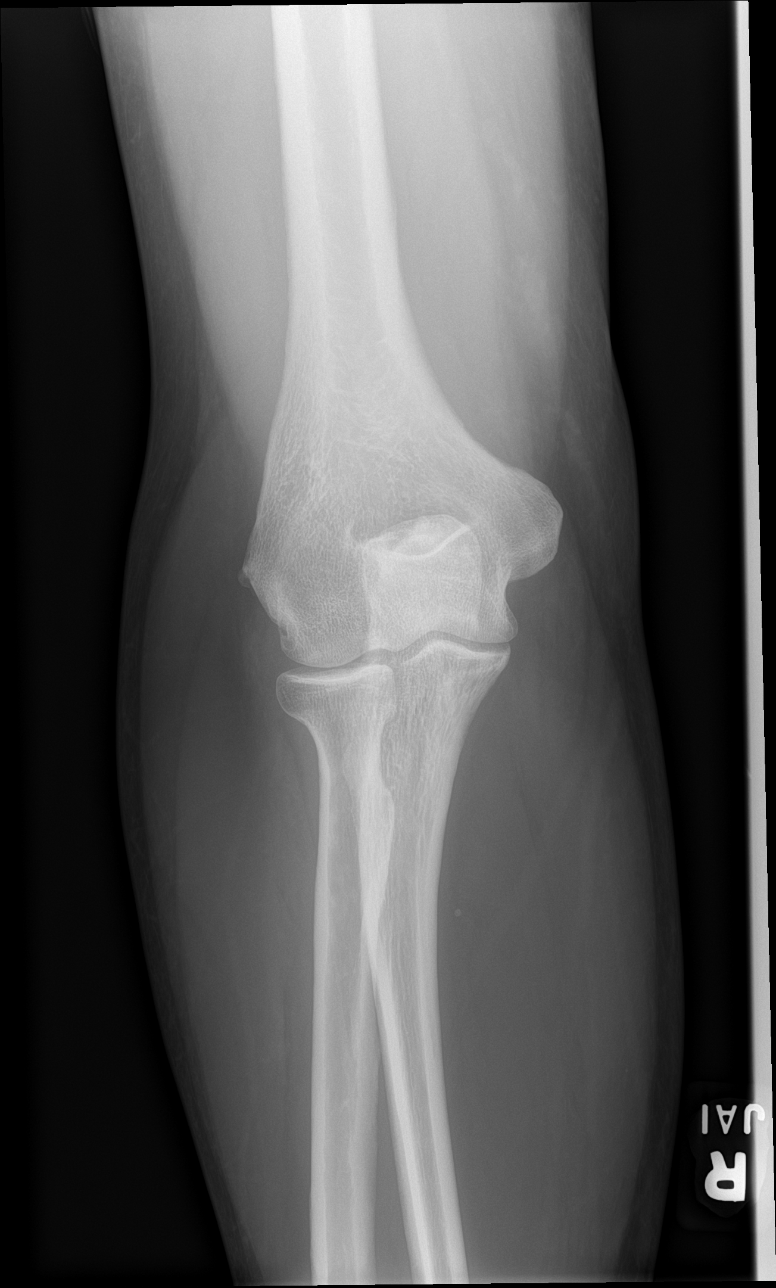

[elbow obl (1 of 2)]
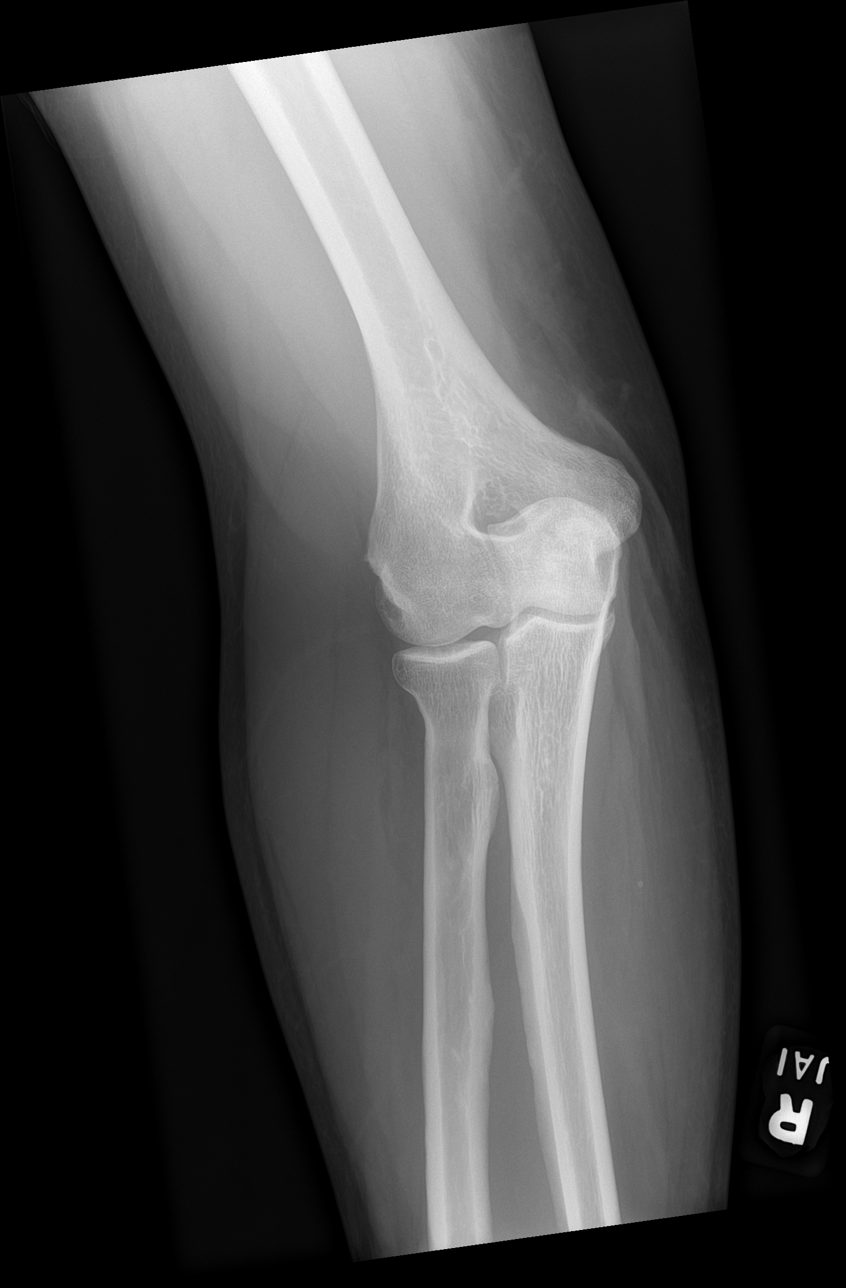

[elbow obl (2 of 2)]
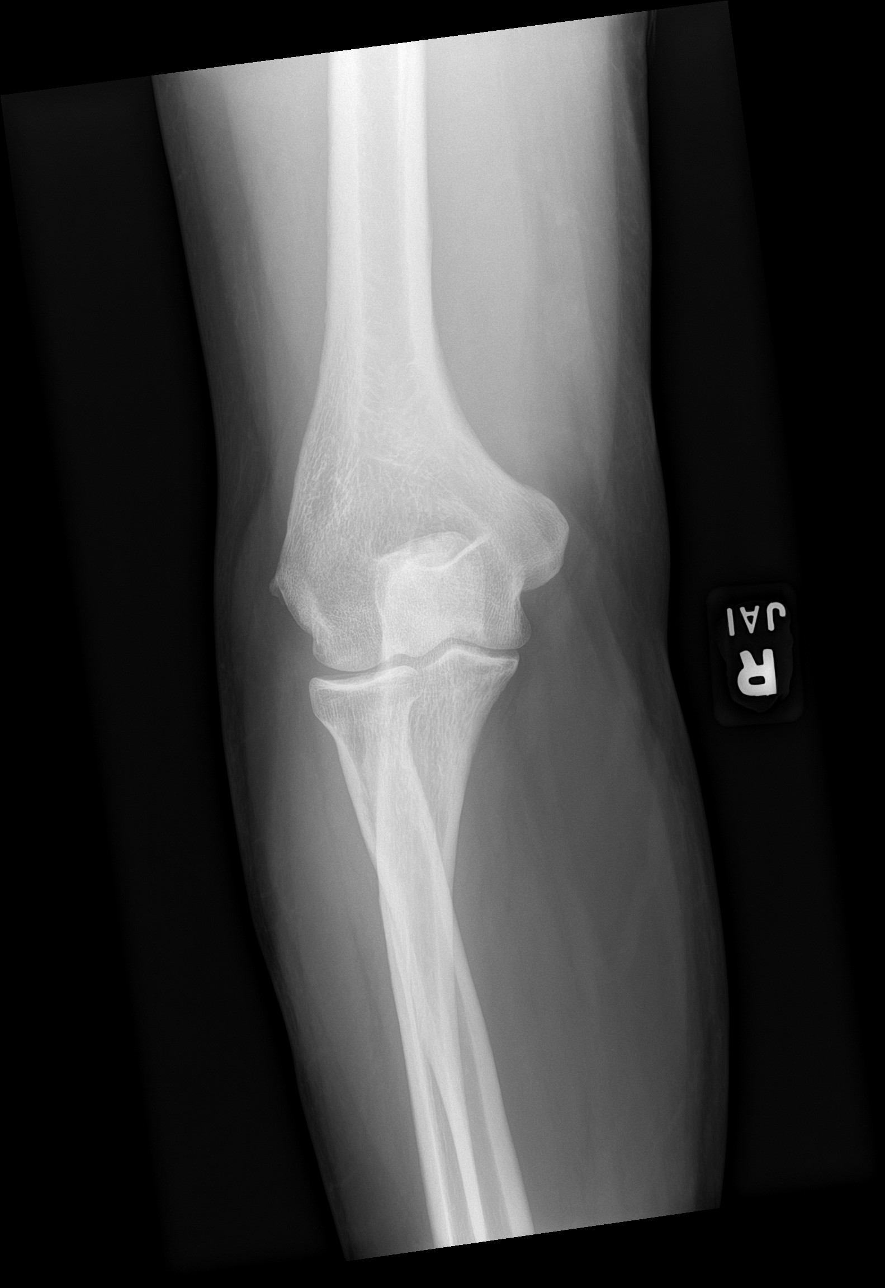

[elbow lat]
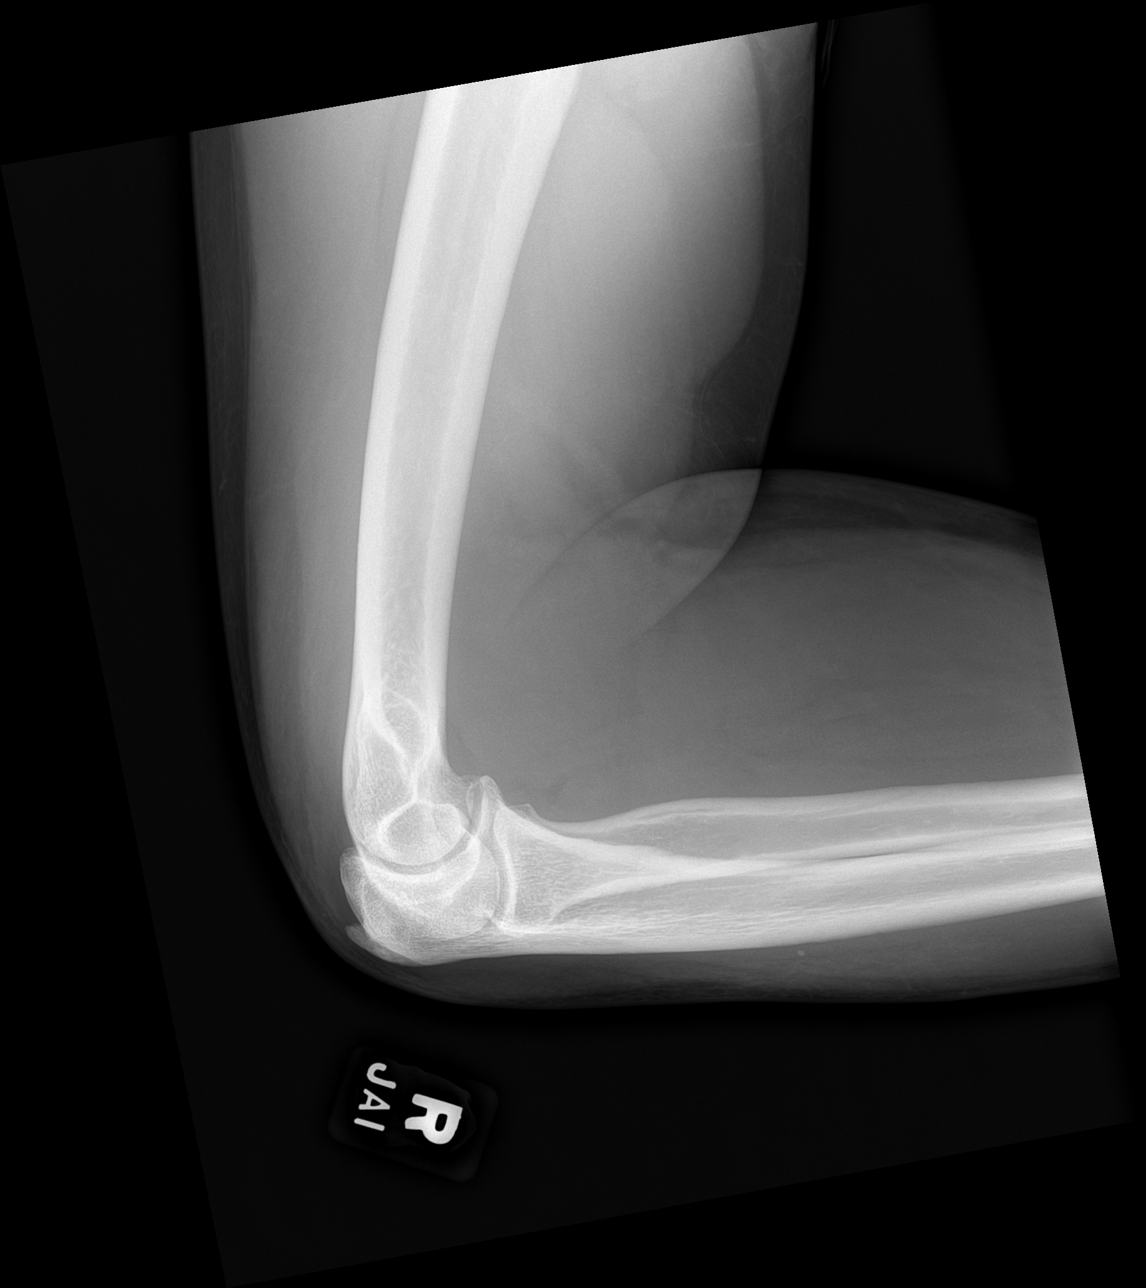

[4 of 4 positions shown; findings below may reference images not displayed]

FINDINGS: There is no evidence of fracture, dislocation, or joint effusion.
Normal joint spaces and alignment. There is a small olecranon spur.
There is no evidence of arthropathy or other focal bone abnormality.
Soft tissues are unremarkable.
IMPRESSION: Small olecranon spur. Otherwise unremarkable radiographs of the
right elbow.

## 2021-04-27 NOTE — Patient Instructions (Signed)
Thank you for coming in today.   I've referred you to Physical Therapy.  Let us know if you don't hear from them in one week.   If not improving plan for MRI.   Please get an Xray today before you leave   Recheck in 4-6 weeks.   Let me know if you are not ok.

## 2021-04-28 NOTE — Progress Notes (Signed)
Right elbow x-ray shows a small bone spur at the tip of the elbow otherwise it looks normal to radiology

## 2021-05-01 NOTE — Progress Notes (Signed)
[facility] Telephone:([phone]   [phone]  INITIAL CONSULT NOTE  Patient Care Team: Sheliah Hatch, MD as PCP - General (Family Medicine)  Hematological/Oncological History  1) 05/2012: Diagnosed with pulmonary embolism and left lower leg DVT. Treated with coumadin. Provoking factor felt to be left ankle fracture and prolonged immobility with car travel. (Records not available for review).  2)04/17/2021: Doppler US confirmed acute DVT involving the left popliteal and left gastrocnemius vein. Started on Xarelto.   3) 05/02/2021: Establish care with [name] PA-C   CHIEF COMPLAINTS/PURPOSE OF CONSULTATION:  "Recurrent DVT"  HISTORY OF PRESENTING ILLNESS:  Cameron Kim 50 y.o. male with medical history significant for seizure disorder and history of LE DVT and PE following left ankle fracture in 2013. Patient is unaccompanied for this visit.   On exam today, Mr. Cameron Kim reports his energy and appetite are stable.  He is staying active and continues to complete his ADLs on his own.  Patient has a good appetite without any noticeable weight changes.  He denies any nausea, vomiting or abdominal pain.  Bowel movements are regular without any diarrhea or constipation.  Patient denies easy bruising or signs of bleeding.  This includes hematochezia, melena, hematuria, epistaxis or gingival bleeding.  Patient has chronic joint pain and upper extremity pain.  He is under the care of  Sports Medicine who recommended physical therapy.  Patient reports that since starting Xarelto, he has noticed that left calf swelling and pain have nearly resolved.  He reports good compliance with taking Xarelto.  Patient denies any fevers, chills, night sweats, shortness of breath, chest pain or cough.  He has no other complaints.  Rest of the 10 point ROS is below.  MEDICAL HISTORY:  Past Medical History:  Diagnosis Date   Abnormal EKG    Ankle fracture, left    Peripheral  vascular disease (HCC) 2013   dvt-pe after ankle fx   Pulmonary embolism (HCC) 05/2012   Seizure disorder (HCC) 11/07/2020    SURGICAL HISTORY: Past Surgical History:  Procedure Laterality Date   I & D EXTREMITY Right 06/07/2016   Procedure: IRRIGATION AND DEBRIDEMENT RIGHT WRIST,MEDIAN NERVE EXPLORATION NEUROLYSIS AND REPAIR;  Surgeon: Dominica Severin, MD;  Location: [facility] OR;  Service: Orthopedics;  Laterality: Right;   medial nerve surgry right arm     NO PAST SURGERIES      SOCIAL HISTORY: Social History   Socioeconomic History   Marital status: Married    Spouse name: Not on file   Number of children: Not on file   Years of education: Not on file   Highest education level: Not on file  Occupational History   Not on file  Tobacco Use   Smoking status: Never   Smokeless tobacco: Never  Vaping Use   Vaping Use: Never used  Substance and Sexual Activity   Alcohol use: Yes    Alcohol/week: 3.0 standard drinks    Types: 1 Glasses of wine, 1 Cans of beer, 1 Shots of liquor per week    Comment: socially   Drug use: No   Sexual activity: Yes  Other Topics Concern   Not on file  Social History Narrative   Not on file   Social Determinants of Health   Financial Resource Strain: Not on file  Food Insecurity: Not on file  Transportation Needs: Not on file  Physical Activity: Not on file  Stress: Not on file  Social Connections: Not on file  Intimate Partner  Violence: Not on file    FAMILY HISTORY: Family History  Problem Relation Age of Onset   Diabetes Mother    Hypertension Mother    Heart murmur Mother    Stroke Mother    Diabetes Father    Hypertension Father    Stroke Sister    Diabetes Paternal Grandmother    Diabetes Paternal Grandfather     ALLERGIES:  has No Known Allergies.  MEDICATIONS:  Current Outpatient Medications  Medication Sig Dispense Refill   RIVAROXABAN (XARELTO) VTE STARTER PACK (15 & 20 MG) Follow package directions: Take one   tablet by mouth twice a day. On day 22, switch to one  tablet once a day. Take with food. 51 each 0   levETIRAcetam (KEPPRA) 500 MG tablet Take 1 tablet (500 mg total) by mouth 2 (two) times daily. (Patient not taking: No sig reported) 180 tablet 3   No current facility-administered medications for this visit.    REVIEW OF SYSTEMS:   Constitutional: ( - ) fevers, ( - )  chills , ( - ) night sweats Eyes: ( - ) blurriness of vision, ( - ) double vision, ( - ) watery eyes Ears, nose, mouth, throat, and face: ( - ) mucositis, ( - ) sore throat Respiratory: ( - ) cough, ( - ) dyspnea, ( - ) wheezes Cardiovascular: ( - ) palpitation, ( - ) chest discomfort, ( - ) lower extremity swelling Gastrointestinal:  ( - ) nausea, ( - ) heartburn, ( - ) change in bowel habits Skin: ( - ) abnormal skin rashes Lymphatics: ( - ) new lymphadenopathy, ( - ) easy bruising Neurological: ( - ) numbness, ( - ) tingling, ( - ) new weaknesses Behavioral/Psych: ( - ) mood change, ( - ) new changes  All other systems were reviewed with the patient and are negative.  PHYSICAL EXAMINATION: ECOG PERFORMANCE STATUS: 0 - Asymptomatic  Vitals:   05/02/21 0902  BP: (!) 148/78  Pulse: 100  Resp: 18  Temp: 99.2 F (37.3 C)  SpO2: 96%   Filed Weights   05/02/21 0902  Weight: 243 lb 1.6 oz (110.3 kg)    GENERAL: well appearing male in NAD  SKIN: skin color, texture, turgor are normal, no rashes or significant lesions EYES: conjunctiva are pink and non-injected, sclera clear OROPHARYNX: no exudate, no erythema; lips, buccal mucosa, and tongue normal  NECK: supple, non-tender LYMPH:  no palpable lymphadenopathy in the cervical, axillary or supraclavicular lymph nodes.  LUNGS: clear to auscultation and percussion with normal breathing effort HEART: regular rate & rhythm and no murmurs and no lower extremity edema ABDOMEN: soft, non-tender, non-distended, normal bowel sounds Musculoskeletal: no cyanosis of  digits and no clubbing  PSYCH: alert & oriented x 3, fluent speech NEURO: no focal motor/sensory deficits  LABORATORY DATA:  I have reviewed the data as listed CBC Latest Ref Rng & Units 05/02/2021 03/13/2021 03/11/2020  WBC 4.0 - 10.5 K/uL 11.3(H) 9.4 9.3  Hemoglobin 13.0 - 17.0 g/dL 21.3 08.6 57.8  Hematocrit 39.0 - 52.0 % 46.5 45.2 42.5  Platelets 150 - 400 K/uL 236 205.0 174.0    CMP Latest Ref Rng & Units 04/17/2021 03/13/2021 03/11/2020  Glucose 70 - 99 mg/dL 98 88 93  BUN 6 - 23 mg/dL Creatinine 0.40 - 1.50 mg/dL 4.69 6.29 5.28  Sodium 135 - 145 mEq/L 138 139 139  Potassium 3.5 - 5.1 mEq/L 3.9 4.4 4.5  Chloride 96 - 112  mEq/L 101 102 100  CO2 19 - 32 mEq/L 30 28 32  Calcium 8.4 - 10.5 mg/dL 8.8 9.0 9.4  Total Protein 6.0 - 8.3 g/dL - 7.0 7.4  Total Bilirubin 0.2 - 1.2 mg/dL - 0.9 1.2  Alkaline Phos 39 - 117 U/L - 65 67  AST 0 - 37 U/L - 22 23  ALT 0 - 53 U/L - 17 19   RADIOGRAPHIC STUDIES: I have personally reviewed the radiological images as listed and agreed with the findings in the report. DG ELBOW COMPLETE RIGHT (3+VIEW)  Result Date: 04/27/2021 CLINICAL DATA:  Right elbow pain for 6 months. EXAM: RIGHT ELBOW - COMPLETE 3+ VIEW COMPARISON:  None. FINDINGS: There is no evidence of fracture, dislocation, or joint effusion. Normal joint spaces and alignment. There is a small olecranon spur. There is no evidence of arthropathy or other focal bone abnormality. Soft tissues are unremarkable. IMPRESSION: Small olecranon spur. Otherwise unremarkable radiographs of the right elbow. Electronically Signed   By: Narda Rutherford M.D.   On: 04/27/2021 23:57   VAS [place] LOWER EXTREMITY VENOUS (DVT)  Result Date: 04/17/2021  Lower Venous DVT Study Patient Name:  Cameron Kim  Date of Exam:   04/17/2021 Medical Rec #: [id number]     Accession #:    [id number] Date of Birth: 1971-05-12     Patient Gender: M Patient Age:   50Y Exam Location:  [facility] Procedure:      VAS  [place] LOWER EXTREMITY VENOUS (DVT) Referring Phys: [id number] Sheliah Hatch --------------------------------------------------------------------------------  Indications: Pain. Other Indications: History of pulmonary embolism and deep vein thrombosis. Performing Technologist: Ethelle Lyon  Examination Guidelines: A complete evaluation includes B-mode imaging, spectral Doppler, color Doppler, and power Doppler as needed of all accessible portions of each vessel. Bilateral testing is considered an integral part of a complete examination. Limited examinations for reoccurring indications may be performed as noted. The reflux portion of the exam is performed with the patient in reverse Trendelenburg.  +-----+---------------+---------+-----------+----------+--------------+ RIGHTCompressibilityPhasicitySpontaneityPropertiesThrombus Aging +-----+---------------+---------+-----------+----------+--------------+ CFV  Full           Yes      Yes                                 +-----+---------------+---------+-----------+----------+--------------+   +---------+---------------+---------+-----------+----------+--------------+ LEFT     CompressibilityPhasicitySpontaneityPropertiesThrombus Aging +---------+---------------+---------+-----------+----------+--------------+ CFV      Full           Yes      Yes                                 +---------+---------------+---------+-----------+----------+--------------+ SFJ      Full                                                        +---------+---------------+---------+-----------+----------+--------------+ FV Prox  Full                                                        +---------+---------------+---------+-----------+----------+--------------+ FV Mid   Full  Yes      Yes                                 +---------+---------------+---------+-----------+----------+--------------+ FV DistalFull                                                         +---------+---------------+---------+-----------+----------+--------------+ PFV      Full                                                        +---------+---------------+---------+-----------+----------+--------------+ POP      None                                                        +---------+---------------+---------+-----------+----------+--------------+ PTV      Full                                                        +---------+---------------+---------+-----------+----------+--------------+ PERO     Full                                                        +---------+---------------+---------+-----------+----------+--------------+ Gastroc  None                                                        +---------+---------------+---------+-----------+----------+--------------+ GSV      Full                                                        +---------+---------------+---------+-----------+----------+--------------+ SSV      None                                                        +---------+---------------+---------+-----------+----------+--------------+     Summary: RIGHT: - No evidence of common femoral vein obstruction.  LEFT: - Findings consistent with acute deep vein thrombosis involving the left popliteal vein, and left gastrocnemius veins. - Findings consistent with acute superficial vein thrombosis involving the left small saphenous vein.  - No cystic structure found in the popliteal fossa. - Results given to Dr. Neva SeatGreene (04/17/2021).  *  See table(s) above for measurements and observations. Electronically signed by Sherald Hess MD on 04/17/2021 at 7:11:59 PM.    Final     ASSESSMENT & PLAN Cameron Kim is a 50 y.o. male who presents to the clinic for recurrent left lower leg DVT following history of left lower leg DVT and pulmonary embolism in 2013.  Although records are not available, patient reports that he  developed a left lower limb DVT and PE in 2013 following a left ankle fracture and prolonged car travel.  I reviewed provoking risk factors for venous thromboembolism (VTE), including infection, surgery, trauma, prolonged travel, prolong immobility, hormone therapy and smoking. There is no definite provoking cause that led to patient's recent DVT involving the left popliteal vein and the gastrocnemius veins.  Due to recurrent VTE without a provoking cause, I would recommend indefinite anticoagulation. Patient is currently on Xarelto with good compliance. Patient would like to take some time to think about the recommendations. Patient will proceed with hypercoagulable workup today to check CBC, CMP, cardiolipin antibodies, beta-2 glycoprotein antibodies, lupus anticoagulant, protein C level activity, protein S level, factor V Leiden, prothrombin gene and antithrombin III.   #Recurrent LE DVT: --First episode in 2013 involved PE and DVT in left lower leg following left ankle fracture and prolonged car travel. --Second episode occurred on 04/17/2021 whcih involved DVT of left popliteal vein and the gastrocnemius veins. --There is no provoking factor that caused recent DVT on 04/17/2021 so recommend indefinite anticoagulation. Patient is requesting time to think about proposed recommendation.  --Currently on Xarelto with good compliance. Recommend to continue.  --Hypercoagulable workup today: CBC, CMP, cardiolipin antibodies, beta-2 glycoprotein antibodies, lupus anticoagulant, protein C level activity, protein S level, factor V Leiden, prothrombin gene and antithrombin III.  --RTC in 3 months with repeat labs.   Orders Placed This Encounter  Procedures   CBC with Differential (Cancer Center Only)    Standing Status:   Future    Number of Occurrences:   1    Standing Expiration Date:   05/02/2022   CMP (Cancer Center only)    Standing Status:   Future    Number of Occurrences:   1    Standing  Expiration Date:   05/02/2022   Beta-2-glycoprotein i abs, IgG/M/A    Standing Status:   Future    Number of Occurrences:   1    Standing Expiration Date:   05/02/2022   Cardiolipin antibodies, IgG, IgM, IgA*    Standing Status:   Future    Number of Occurrences:   1    Standing Expiration Date:   05/02/2022   Lupus anticoagulant panel*    Standing Status:   Future    Number of Occurrences:   1    Standing Expiration Date:   05/02/2022   Factor 5 Leiden*    Standing Status:   Future    Number of Occurrences:   1    Standing Expiration Date:   05/02/2022   Prothrombin gene mutation*    Standing Status:   Future    Number of Occurrences:   1    Standing Expiration Date:   05/02/2022   Antithrombin III   Protein C activity   Protein C, total   Protein S activity   Protein S, total    All questions were answered. The patient knows to call the clinic with any problems, questions or concerns.  I have spent a total of 60 minutes minutes of face-to-face and  non-face-to-face time, preparing to see the patient, obtaining and/or reviewing separately obtained history, performing a medically appropriate examination, counseling and educating the patient, ordering tests, documenting clinical information in the electronic health record, and care coordination.   [name], PA-C Department of Hematology/Oncology [facility] Phone: [phone]

## 2021-05-02 ENCOUNTER — Encounter: Payer: Self-pay | Admitting: Physician Assistant

## 2021-05-02 ENCOUNTER — Inpatient Hospital Stay: Payer: BC Managed Care – PPO

## 2021-05-02 ENCOUNTER — Other Ambulatory Visit: Payer: Self-pay

## 2021-05-02 ENCOUNTER — Inpatient Hospital Stay: Payer: BC Managed Care – PPO | Attending: Physician Assistant | Admitting: Physician Assistant

## 2021-05-02 VITALS — BP 148/78 | HR 100 | Temp 99.2°F | Resp 18 | Ht 72.0 in | Wt 243.1 lb

## 2021-05-02 DIAGNOSIS — Z86718 Personal history of other venous thrombosis and embolism: Secondary | ICD-10-CM | POA: Diagnosis not present

## 2021-05-02 DIAGNOSIS — Z86711 Personal history of pulmonary embolism: Secondary | ICD-10-CM | POA: Insufficient documentation

## 2021-05-02 DIAGNOSIS — I82409 Acute embolism and thrombosis of unspecified deep veins of unspecified lower extremity: Secondary | ICD-10-CM | POA: Insufficient documentation

## 2021-05-02 DIAGNOSIS — I82432 Acute embolism and thrombosis of left popliteal vein: Secondary | ICD-10-CM | POA: Diagnosis not present

## 2021-05-02 DIAGNOSIS — Z7901 Long term (current) use of anticoagulants: Secondary | ICD-10-CM | POA: Insufficient documentation

## 2021-05-02 LAB — CMP (CANCER CENTER ONLY)
ALT: 63 U/L — ABNORMAL HIGH (ref 0–44)
AST: 80 U/L — ABNORMAL HIGH (ref 15–41)
Albumin: 3.3 g/dL — ABNORMAL LOW (ref 3.5–5.0)
Alkaline Phosphatase: 65 U/L (ref 38–126)
Anion gap: 8 (ref 5–15)
BUN: 17 mg/dL (ref 6–20)
CO2: 28 mmol/L (ref 22–32)
Calcium: 9.1 mg/dL (ref 8.9–10.3)
Chloride: 106 mmol/L (ref 98–111)
Creatinine: 0.99 mg/dL (ref 0.61–1.24)
GFR, Estimated: >60 mL/min (ref >60)
Glucose, Bld: 86 mg/dL (ref 70–99)
Potassium: 4.3 mmol/L (ref 3.5–5.1)
Sodium: 142 mmol/L (ref 135–145)
Total Bilirubin: 0.5 mg/dL (ref 0.3–1.2)
Total Protein: 7.5 g/dL (ref 6.5–8.1)

## 2021-05-02 LAB — CBC WITH DIFFERENTIAL (CANCER CENTER ONLY)
Abs Immature Granulocytes: 0.04 10*3/uL (ref 0.00–0.07)
Basophils Absolute: 0.1 10*3/uL (ref 0.0–0.1)
Basophils Relative: 1 %
Eosinophils Absolute: 0.3 10*3/uL (ref 0.0–0.5)
Eosinophils Relative: 2 %
HCT: 46.5 % (ref 39.0–52.0)
Hemoglobin: 15.2 g/dL (ref 13.0–17.0)
Immature Granulocytes: 0 %
Lymphocytes Relative: 8 %
Lymphs Abs: 0.9 10*3/uL (ref 0.7–4.0)
MCH: 30.4 pg (ref 26.0–34.0)
MCHC: 32.7 g/dL (ref 30.0–36.0)
MCV: 93 fL (ref 80.0–100.0)
Monocytes Absolute: 0.6 10*3/uL (ref 0.1–1.0)
Monocytes Relative: 6 %
Neutro Abs: 9.4 10*3/uL — ABNORMAL HIGH (ref 1.7–7.7)
Neutrophils Relative %: 83 %
Platelet Count: 236 10*3/uL (ref 150–400)
RBC: 5 MIL/uL (ref 4.22–5.81)
RDW: 12.9 % (ref 11.5–15.5)
WBC Count: 11.3 10*3/uL — ABNORMAL HIGH (ref 4.0–10.5)
nRBC: 0 % (ref 0.0–0.2)

## 2021-05-02 LAB — ANTITHROMBIN III: AntiThromb III Func: 89 % (ref 75–120)

## 2021-05-03 LAB — PROTEIN C ACTIVITY: Protein C Activity: 104 % (ref 73–180)

## 2021-05-03 LAB — PROTEIN S ACTIVITY: Protein S Activity: 37 % — ABNORMAL LOW (ref 63–140)

## 2021-05-03 LAB — PROTEIN S, TOTAL: Protein S Ag, Total: 87 % (ref 60–150)

## 2021-05-04 LAB — BETA-2-GLYCOPROTEIN I ABS, IGG/M/A
Beta-2 Glyco I IgG: <9 GPI IgG units (ref 0–20)
Beta-2-Glycoprotein I IgA: <9 GPI IgA units (ref 0–25)
Beta-2-Glycoprotein I IgM: <9 GPI IgM units (ref 0–32)

## 2021-05-04 LAB — CARDIOLIPIN ANTIBODIES, IGG, IGM, IGA
Anticardiolipin IgA: <9 APL U/mL (ref 0–11)
Anticardiolipin IgG: 9 GPL U/mL (ref 0–14)
Anticardiolipin IgM: 34 MPL U/mL — ABNORMAL HIGH (ref 0–12)

## 2021-05-04 LAB — PTT-LA MIX: PTT-LA Mix: 59.5 s — ABNORMAL HIGH (ref 0.0–48.9)

## 2021-05-04 LAB — LUPUS ANTICOAGULANT PANEL
DRVVT: 135.6 s — ABNORMAL HIGH (ref 0.0–47.0)
PTT Lupus Anticoagulant: 62.9 s — ABNORMAL HIGH (ref 0.0–51.9)

## 2021-05-04 LAB — DRVVT CONFIRM: dRVVT Confirm: 2.4 ratio — ABNORMAL HIGH (ref 0.8–1.2)

## 2021-05-04 LAB — DRVVT MIX: dRVVT Mix: 81.4 s — ABNORMAL HIGH (ref 0.0–40.4)

## 2021-05-04 LAB — PROTEIN C, TOTAL: Protein C, Total: 82 % (ref 60–150)

## 2021-05-04 LAB — HEXAGONAL PHASE PHOSPHOLIPID: Hexagonal Phase Phospholipid: 14 s — ABNORMAL HIGH (ref 0–11)

## 2021-05-08 ENCOUNTER — Telehealth: Payer: Self-pay | Admitting: Physician Assistant

## 2021-05-08 DIAGNOSIS — R748 Abnormal levels of other serum enzymes: Secondary | ICD-10-CM

## 2021-05-08 LAB — FACTOR 5 LEIDEN

## 2021-05-08 LAB — PROTHROMBIN GENE MUTATION

## 2021-05-08 NOTE — Telephone Encounter (Signed)
I called Cameron Kim to review the results from hypercoagulable workup from 05/02/2021. I explained that I'm still waiting for Factor 5 Leiden and Prothrombin gene mutation status. The remaining results were reviewed. There is decrease in protein S activity at 37% which can increase risk of thromboembolism. Anticardioliplin IgM was mildly elevated at 34.   CMP revealed mild increase in AST and ALT levels. Patient denies consumption of alcohol or any other herbal supplements. We will plan to repeat levels in 4 weeks.  I recommend for patient to continue on current anticoagulation with Xarelto and plan to return in 3 months to repeat Protein S activity and Anticardiolipin antibody levels.

## 2021-05-18 ENCOUNTER — Ambulatory Visit: Payer: BC Managed Care – PPO | Admitting: Gastroenterology

## 2021-06-05 ENCOUNTER — Other Ambulatory Visit: Payer: BC Managed Care – PPO

## 2021-06-09 ENCOUNTER — Telehealth: Payer: Self-pay | Admitting: Physician Assistant

## 2021-06-09 ENCOUNTER — Ambulatory Visit: Payer: BC Managed Care – PPO | Admitting: Family Medicine

## 2021-06-09 NOTE — Telephone Encounter (Signed)
Scheduled per sch msg. Called and left msg  

## 2021-06-13 ENCOUNTER — Telehealth: Payer: Self-pay

## 2021-06-13 NOTE — Telephone Encounter (Signed)
Pt's wife, Waynetta Sandy called and states pt is incarcerated and has called her c/o BLE swelling and asks if this could be from Xarelto. Donnetta Hail pt needs to be seen by nurse  where he is and be evaluated as swelling should not be coming from Xarelto since he has been taking for one month. Alyria verbalized thanks and understanding.

## 2021-06-15 ENCOUNTER — Other Ambulatory Visit: Payer: BC Managed Care – PPO

## 2021-08-02 ENCOUNTER — Inpatient Hospital Stay: Payer: BC Managed Care – PPO | Attending: Physician Assistant | Admitting: Physician Assistant

## 2021-08-02 ENCOUNTER — Inpatient Hospital Stay: Payer: BC Managed Care – PPO

## 2021-11-06 NOTE — Progress Notes (Deleted)
PATIENT: Cameron Kim DOB: Feb 10, 1971  REASON FOR VISIT: Follow up for seizure  HISTORY FROM: Patient PRIMARY NEUROLOGIST:   HISTORY OF PRESENT ILLNESS: Today 11/06/21 Cameron Kim   HISTORY 11/07/2020 Dr. Anne Hahn: [name] is a 51 year old right-handed black male with a history of seizures.  The patient had his first seizure in 2016 associated with use of cocaine, but he had an unprovoked seizure on 05 April 2020.  EEG study was unremarkable, but MRI of the brain showed evidence of gliosis of the anterior left temporal area and for this reason he was placed on Keppra.  The patient is on 500 mg twice daily, he seems to be doing well with this with some minimal irritability but no significant drowsiness.  The patient has not had any recurrent seizure events.  He works in [employer], but mainly works on a ground-level with this.  The patient returns to the office today for further evaluation.  REVIEW OF SYSTEMS: Out of a complete 14 system review of symptoms, the patient complains only of the following symptoms, and all other reviewed systems are negative.  ALLERGIES: No Known Allergies  HOME MEDICATIONS: Outpatient Medications Prior to Visit  Medication Sig Dispense Refill   levETIRAcetam (KEPPRA) 500 MG tablet Take 1 tablet (500 mg total) by mouth 2 (two) times daily. (Patient not taking: No sig reported) 180 tablet 3   RIVAROXABAN (XARELTO) VTE STARTER PACK (15 & 20 MG) Follow package directions: Take one 15mg  tablet by mouth twice a day. On day 22, switch to one 20mg  tablet once a day. Take with food. 51 each 0   No facility-administered medications prior to visit.    PAST MEDICAL HISTORY: Past Medical History:  Diagnosis Date   Abnormal EKG    Ankle fracture, left    Peripheral vascular disease (HCC) 2013   dvt-pe after ankle fx   Pulmonary embolism (HCC) 05/2012   Seizure disorder (HCC) 11/07/2020    PAST SURGICAL HISTORY: Past Surgical History:  Procedure Laterality  Date   I & D EXTREMITY Right 06/07/2016   Procedure: IRRIGATION AND DEBRIDEMENT RIGHT WRIST,MEDIAN NERVE EXPLORATION NEUROLYSIS AND REPAIR;  Surgeon: 11/09/2020, MD;  Location: [facility] OR;  Service: Orthopedics;  Laterality: Right;   medial nerve surgry right arm     NO PAST SURGERIES      FAMILY HISTORY: Family History  Problem Relation Age of Onset   Diabetes Mother    Hypertension Mother    Heart murmur Mother    Stroke Mother    Diabetes Father    Hypertension Father    Stroke Sister    Diabetes Paternal Grandmother    Diabetes Paternal Grandfather     SOCIAL HISTORY: Social History   Socioeconomic History   Marital status: Married    Spouse name: Not on file   Number of children: Not on file   Years of education: Not on file   Highest education level: Not on file  Occupational History   Not on file  Tobacco Use   Smoking status: Never   Smokeless tobacco: Never  Vaping Use   Vaping Use: Never used  Substance and Sexual Activity   Alcohol use: Yes    Alcohol/week: 3.0 standard drinks    Types: 1 Glasses of wine, 1 Cans of beer, 1 Shots of liquor per week    Comment: socially   Drug use: No   Sexual activity: Yes  Other Topics Concern   Not on file  Social History Narrative  Not on file   Social Determinants of Health   Financial Resource Strain: Not on file  Food Insecurity: Not on file  Transportation Needs: Not on file  Physical Activity: Not on file  Stress: Not on file  Social Connections: Not on file  Intimate Partner Violence: Not on file      PHYSICAL EXAM  There were no vitals filed for this visit. There is no height or weight on file to calculate BMI.  Generalized: Well developed, in no acute distress   Neurological examination  Mentation: Alert oriented to time, place, history taking. Follows all commands speech and language fluent Cranial nerve II-XII: Pupils were equal round reactive to light. Extraocular movements were full,  visual field were full on confrontational test. Facial sensation and strength were normal. Uvula tongue midline. Head turning and shoulder shrug  were normal and symmetric. Motor: The motor testing reveals 5 over 5 strength of all 4 extremities. Good symmetric motor tone is noted throughout.  Sensory: Sensory testing is intact to soft touch on all 4 extremities. No evidence of extinction is noted.  Coordination: Cerebellar testing reveals good finger-nose-finger and heel-to-shin bilaterally.  Gait and station: Gait is normal. Tandem gait is normal. Romberg is negative. No drift is seen.  Reflexes: Deep tendon reflexes are symmetric and normal bilaterally.   DIAGNOSTIC DATA (LABS, IMAGING, TESTING) - I reviewed patient records, labs, notes, testing and imaging myself where available.  Lab Results  Component Value Date   WBC 11.3 (H) 05/02/2021   HGB 15.2 05/02/2021   HCT 46.5 05/02/2021   MCV 93.0 05/02/2021   PLT 236 05/02/2021      Component Value Date/Time   NA 142 05/02/2021 0944   K 4.3 05/02/2021 0944   CL 106 05/02/2021 0944   CO2 28 05/02/2021 0944   GLUCOSE 86 05/02/2021 0944   BUN 17 05/02/2021 0944   CREATININE 0.99 05/02/2021 0944   CREATININE 1.10 02/25/2017 1623   CALCIUM 9.1 05/02/2021 0944   PROT 7.5 05/02/2021 0944   ALBUMIN 3.3 (L) 05/02/2021 0944   AST 80 (H) 05/02/2021 0944   ALT 63 (H) 05/02/2021 0944   ALKPHOS 65 05/02/2021 0944   BILITOT 0.5 05/02/2021 0944   GFRNONAA >60 05/02/2021 0944   GFRAA >60 06/06/2016 0829   Lab Results  Component Value Date   CHOL 170 03/13/2021   HDL 44.60 03/13/2021   LDLCALC 107 (H) 03/13/2021   TRIG 93.0 03/13/2021   CHOLHDL 4 03/13/2021   Lab Results  Component Value Date   HGBA1C 6.0 09/01/2018   No results found for: [id number] Lab Results  Component Value Date   TSH 1.46 03/13/2021      ASSESSMENT AND PLAN 51 y.o. year old male  has a past medical history of Abnormal EKG, Ankle fracture, left,  Peripheral vascular disease (HCC) (2013), Pulmonary embolism (HCC) (05/2012), and Seizure disorder (HCC) (11/07/2020). here with:  History of seizures       Otila Kluver, DNP 11/06/2021, 1:47 PM [facility] [address], [address] [place], [place] [address] [phone]

## 2021-11-07 ENCOUNTER — Encounter: Payer: Self-pay | Admitting: Neurology

## 2021-11-07 ENCOUNTER — Ambulatory Visit: Payer: BC Managed Care – PPO | Admitting: Neurology

## 2022-02-08 ENCOUNTER — Encounter: Payer: Self-pay | Admitting: Family Medicine

## 2022-02-08 ENCOUNTER — Ambulatory Visit (INDEPENDENT_AMBULATORY_CARE_PROVIDER_SITE_OTHER): Payer: BC Managed Care – PPO | Admitting: Family Medicine

## 2022-02-08 VITALS — BP 130/78 | HR 101 | Temp 98.7°F | Resp 18 | Ht 73.0 in | Wt 197.1 lb

## 2022-02-08 DIAGNOSIS — R634 Abnormal weight loss: Secondary | ICD-10-CM | POA: Diagnosis not present

## 2022-02-08 DIAGNOSIS — R600 Localized edema: Secondary | ICD-10-CM | POA: Diagnosis not present

## 2022-02-08 DIAGNOSIS — M25511 Pain in right shoulder: Secondary | ICD-10-CM

## 2022-02-08 DIAGNOSIS — G8929 Other chronic pain: Secondary | ICD-10-CM

## 2022-02-08 DIAGNOSIS — R531 Weakness: Secondary | ICD-10-CM

## 2022-02-08 DIAGNOSIS — I252 Old myocardial infarction: Secondary | ICD-10-CM

## 2022-02-08 DIAGNOSIS — R471 Dysarthria and anarthria: Secondary | ICD-10-CM

## 2022-02-08 DIAGNOSIS — M25512 Pain in left shoulder: Secondary | ICD-10-CM

## 2022-02-08 DIAGNOSIS — L853 Xerosis cutis: Secondary | ICD-10-CM

## 2022-02-08 LAB — BASIC METABOLIC PANEL
BUN: 22 mg/dL (ref 6–23)
CO2: 30 mEq/L (ref 19–32)
Calcium: 8.1 mg/dL — ABNORMAL LOW (ref 8.4–10.5)
Chloride: 104 mEq/L (ref 96–112)
Creatinine, Ser: 0.81 mg/dL (ref 0.40–1.50)
GFR: 102.24 mL/min (ref >60.00)
Glucose, Bld: 75 mg/dL (ref 70–99)
Potassium: 4.3 mEq/L (ref 3.5–5.1)
Sodium: 139 mEq/L (ref 135–145)

## 2022-02-08 LAB — VITAMIN D 25 HYDROXY (VIT D DEFICIENCY, FRACTURES): VITD: <7 ng/mL — ABNORMAL LOW (ref 30.00–100.00)

## 2022-02-08 LAB — CBC WITH DIFFERENTIAL/PLATELET
Basophils Absolute: 0.1 10*3/uL (ref 0.0–0.1)
Basophils Relative: 0.9 % (ref 0.0–3.0)
Eosinophils Absolute: 0.2 10*3/uL (ref 0.0–0.7)
Eosinophils Relative: 1.6 % (ref 0.0–5.0)
HCT: 36.8 % — ABNORMAL LOW (ref 39.0–52.0)
Hemoglobin: 11.5 g/dL — ABNORMAL LOW (ref 13.0–17.0)
Lymphocytes Relative: 12.7 % (ref 12.0–46.0)
Lymphs Abs: 2 10*3/uL (ref 0.7–4.0)
MCHC: 31.3 g/dL (ref 30.0–36.0)
MCV: 88.8 fl (ref 78.0–100.0)
Monocytes Absolute: 1.3 10*3/uL — ABNORMAL HIGH (ref 0.1–1.0)
Monocytes Relative: 8.2 % (ref 3.0–12.0)
Neutro Abs: 11.8 10*3/uL — ABNORMAL HIGH (ref 1.4–7.7)
Neutrophils Relative %: 76.6 % (ref 43.0–77.0)
Platelets: 443 10*3/uL — ABNORMAL HIGH (ref 150.0–400.0)
RBC: 4.14 Mil/uL — ABNORMAL LOW (ref 4.22–5.81)
RDW: 16.3 % — ABNORMAL HIGH (ref 11.5–15.5)
WBC: 15.4 10*3/uL — ABNORMAL HIGH (ref 4.0–10.5)

## 2022-02-08 LAB — HEPATIC FUNCTION PANEL
ALT: 14 U/L (ref 0–53)
AST: 65 U/L — ABNORMAL HIGH (ref 0–37)
Albumin: 2.8 g/dL — ABNORMAL LOW (ref 3.5–5.2)
Alkaline Phosphatase: 72 U/L (ref 39–117)
Bilirubin, Direct: 0.1 mg/dL (ref 0.0–0.3)
Total Bilirubin: 0.3 mg/dL (ref 0.2–1.2)
Total Protein: 7.6 g/dL (ref 6.0–8.3)

## 2022-02-08 LAB — B12 AND FOLATE PANEL
Folate: 5 ng/mL — ABNORMAL LOW (ref >5.9)
Vitamin B-12: 192 pg/mL — ABNORMAL LOW (ref 211–911)

## 2022-02-08 LAB — TSH: TSH: 4.79 u[IU]/mL (ref 0.35–5.50)

## 2022-02-08 MED ORDER — FUROSEMIDE 20 MG PO TABS: 20.0000 mg | ORAL_TABLET | Freq: Every day | ORAL | 3 refills | Status: DC

## 2022-02-08 MED ORDER — TRIAMCINOLONE ACETONIDE 0.1 % EX OINT: 1.0000 "application " | TOPICAL_OINTMENT | Freq: Two times a day (BID) | CUTANEOUS | 1 refills | Status: DC

## 2022-02-08 MED ORDER — TRAZODONE HCL 50 MG PO TABS: 25.0000 mg | ORAL_TABLET | Freq: Every evening | ORAL | 3 refills | Status: DC | PRN

## 2022-02-08 NOTE — Progress Notes (Signed)
? ?  Subjective:  ? ? Patient ID: Cameron Kim, male    DOB: 1971-01-28, 51 y.o.   MRN: [id number] ? ?HPI ?Weight loss- at last visit in July 2022 pt was 240 lbs.  Today is 197. ? ?NSTEMI- pt was told he had NSTEMI after sustaining a fall in October.  They did a cath that didn't show any blockage.  After testing, he was told he did not have CHF despite LE edema to just below knee. ? ?Dysarthria- pt reports difficulty speaking and swallowing.  'like I had a stroke'.  Speech seems slurred.  He reports this occurred around the same time he fell in October.   ? ?Weakness- pt now requires assistance getting up.  Feels very weak.  Pt is having a hard time picking his feet up- keeps tripping. ? ?Shoulder pain- pt has severely decreased ROM bilaterally.  Unable to reach the top of his head.  Both shoulders are painful. ? ? ?Review of Systems ?For ROS see HPI  ?   ?Objective:  ? Physical Exam ?Vitals reviewed.  ?Constitutional:   ?   General: He is not in acute distress. ?   Appearance: He is ill-appearing.  ?HENT:  ?   Head: Normocephalic and atraumatic.  ?   Right Ear: Tympanic membrane and ear canal normal.  ?   Left Ear: Tympanic membrane and ear canal normal.  ?   Mouth/Throat:  ?   Mouth: Mucous membranes are moist.  ?   Pharynx: Oropharynx is clear.  ?Eyes:  ?   Extraocular Movements: Extraocular movements intact.  ?   Conjunctiva/sclera: Conjunctivae normal.  ?   Pupils: Pupils are equal, round, and reactive to light.  ?Cardiovascular:  ?   Rate and Rhythm: Normal rate and regular rhythm.  ?   Heart sounds: Murmur (I-II/VI SEM) heard.  ?Pulmonary:  ?   Effort: Pulmonary effort is normal. No respiratory distress.  ?   Breath sounds: Normal breath sounds. No wheezing or rhonchi.  ?Abdominal:  ?   General: Abdomen is flat. There is no distension.  ?   Palpations: Abdomen is soft.  ?   Tenderness: There is no abdominal tenderness. There is no guarding or rebound.  ?Musculoskeletal:  ?   Cervical back: Neck supple.  ?    Right lower leg: Edema present.  ?   Left lower leg: Edema present.  ?   Comments: Very limited ROM of shoulders bilaterally  ?Lymphadenopathy:  ?   Cervical: No cervical adenopathy.  ?Skin: ?   General: Skin is warm and dry.  ?Neurological:  ?   Mental Status: He is alert.  ?   Motor: Weakness (both upper and lower extremities) present.  ?   Comments: Speech is dysarthric- almost slurred  ? ? ? ? ? ?   ?Assessment & Plan:  ? ? ?

## 2022-02-08 NOTE — Patient Instructions (Signed)
Follow up in 1 month to recheck BP and weight ?We'll notify you of your lab results and make any changes if needed ?We'll call you to schedule your MRI and ECHO ?Home health (PT and Speech) should call to set up an appt ?We'll call you with your Orthopedic referral ?HOLD the metoprolol for now ?START the Furosemide (Lasix) once daily for swelling ?Make sure you are eating and drinking regularly ?Apply the Triamcinolone ointment to the dry patches 1-2x/day ?USE the Trazodone at night for sleep ?Try and walk to build some stamina/endurance ?Call with any questions or concerns ?Hang in there! ?WELCOME HOME!!! ?

## 2022-02-09 ENCOUNTER — Telehealth: Payer: Self-pay

## 2022-02-09 NOTE — Telephone Encounter (Signed)
Spoke w/ pt and advised him of his lab results. Advised him to get Vitamin D 2,000 units ,B12 and folate as well as we sent in Vitamin D 50,000 to pharmacy . Pt expressed verbal understanding . IFOB test has been mailed to pt .  ?

## 2022-02-09 NOTE — Telephone Encounter (Signed)
-----   Message from Sheliah Hatch, MD sent at 02/09/2022  7:33 AM EDT ----- ?Vit D is low.  Based on this, we need to start prescription 50,000 units weekly x12 weeks in addition to daily OTC supplement of at least 2000 units.  ? ?Your B12 and folate are both low.  Please start OTC supplement of each ? ?Your hemoglobin (blood count) has dropped somewhat to 11.5 (down from 15.2)  We will send you an iFOB stool test to determine if you are having any blood loss from your GI tract ? ?Your white blood cell count is high which can indicate inflammation or infection.  Please monitor for cough, fever, night sweats, etc and let me know if you develop any symptoms that would be concerning for infection.  This could also be elevated due to all of the stress on your body. ?

## 2022-02-11 DIAGNOSIS — L853 Xerosis cutis: Secondary | ICD-10-CM | POA: Insufficient documentation

## 2022-02-11 DIAGNOSIS — R600 Localized edema: Secondary | ICD-10-CM | POA: Insufficient documentation

## 2022-02-11 DIAGNOSIS — R471 Dysarthria and anarthria: Secondary | ICD-10-CM | POA: Insufficient documentation

## 2022-02-11 DIAGNOSIS — R634 Abnormal weight loss: Secondary | ICD-10-CM | POA: Insufficient documentation

## 2022-02-11 DIAGNOSIS — G8929 Other chronic pain: Secondary | ICD-10-CM | POA: Insufficient documentation

## 2022-02-11 DIAGNOSIS — I252 Old myocardial infarction: Secondary | ICD-10-CM | POA: Insufficient documentation

## 2022-02-11 DIAGNOSIS — R531 Weakness: Secondary | ICD-10-CM | POA: Insufficient documentation
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
## 2022-02-11 NOTE — Assessment & Plan Note (Signed)
Pt reports he was told this at an outside facility.  He had a fall in October and was taken to an outside hospital where he was told he had an NSTEMI.  Although they did a cath and he reports no blockage and he was told afterwards that he did not have CHF.  Pt does have bilateral LE edema today which makes me question what is actually going on.  Will get ECHO to assess heart structure and fxn.  Start Lasix for symptomatic relief of edema.  Pt expressed understanding and is in agreement w/ plan.  ?

## 2022-02-11 NOTE — Assessment & Plan Note (Signed)
Pt w/ very dry skin and eczematous patches.  Start topical triamcinolone ointment and encouraged increased water intake ?

## 2022-02-11 NOTE — Assessment & Plan Note (Signed)
New.  As mentioned above, pt has been incarcerated since last July.  He spent much of that time in bed.  He is having difficulty picking his feet up while walking- is tripping frequently.  Needs assistance to get up from a seated position.  Will start HH PT to work on both strength and endurance.  Again, will get brain MRI to assess for prior CVA.  Will follow closely. ?

## 2022-02-11 NOTE — Assessment & Plan Note (Signed)
New.  Pt has lost 43 lbs since July.  During this time he was incarcerated and spent much of his time in bed.  Unclear if he was eating regularly or not as pt does not want to talk about his experiences.  Will check labs to assess for possible underlying cause but encouraged him to increase his caloric intake and eat at least 3 meals/day. ?

## 2022-02-11 NOTE — Assessment & Plan Note (Signed)
New.  Pt has lower extremity edema bilaterally to level of mid-shin.  Has not had edema in the past.  Will start Lasix for symptomatic relief and get ECHO to assess for underlying CHF.  Pt expressed understanding and is in agreement w/ plan.  ?

## 2022-02-11 NOTE — Assessment & Plan Note (Signed)
New.  Unclear if this is due to pt's rapid and unintentional 40lb weight loss or if he had a stroke.  Will get Speech to eval and treat and will also get MRI to assess for hx of CVA or other intracranial abnormality. ?

## 2022-02-12 ENCOUNTER — Ambulatory Visit (INDEPENDENT_AMBULATORY_CARE_PROVIDER_SITE_OTHER): Payer: BC Managed Care – PPO | Admitting: Orthopaedic Surgery

## 2022-02-12 ENCOUNTER — Ambulatory Visit (INDEPENDENT_AMBULATORY_CARE_PROVIDER_SITE_OTHER): Payer: BC Managed Care – PPO

## 2022-02-12 ENCOUNTER — Other Ambulatory Visit: Payer: Self-pay

## 2022-02-12 ENCOUNTER — Ambulatory Visit: Payer: Self-pay

## 2022-02-12 DIAGNOSIS — M25511 Pain in right shoulder: Secondary | ICD-10-CM

## 2022-02-12 DIAGNOSIS — M25512 Pain in left shoulder: Secondary | ICD-10-CM

## 2022-02-12 DIAGNOSIS — E559 Vitamin D deficiency, unspecified: Secondary | ICD-10-CM

## 2022-02-12 DIAGNOSIS — G8929 Other chronic pain: Secondary | ICD-10-CM | POA: Diagnosis not present

## 2022-02-12 MED ORDER — VITAMIN D (ERGOCALCIFEROL) 1.25 MG (50000 UNIT) PO CAPS: 50000.0000 [IU] | ORAL_CAPSULE | ORAL | 1 refills | Status: DC
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
## 2022-02-12 NOTE — Telephone Encounter (Signed)
Patient is calling in stating that the pharmacy does not have a prescription for Vitamin D. ?

## 2022-02-13 ENCOUNTER — Telehealth: Payer: Self-pay

## 2022-02-13 NOTE — Addendum Note (Signed)
Addended by: Barbette Or on: 02/13/2022 11:41 AM ? ? Modules accepted: Orders ? ?

## 2022-02-13 NOTE — Telephone Encounter (Signed)
Patient's wife is calling in stating that she couldn't remember how many units of B12 he needs to take. Wanting to know if okay to take the B12 complex.  ?

## 2022-02-13 NOTE — Telephone Encounter (Signed)
Yes, an OTC B12 complex vitamin/supplement would be great ?

## 2022-02-17 ENCOUNTER — Ambulatory Visit (HOSPITAL_COMMUNITY)
Admission: RE | Admit: 2022-02-17 | Discharge: 2022-02-17 | Disposition: A | Discharge status: Home / Self Care | Payer: BC Managed Care – PPO | Source: Ambulatory Visit | Attending: Family Medicine | Admitting: Family Medicine

## 2022-02-17 DIAGNOSIS — R471 Dysarthria and anarthria: Secondary | ICD-10-CM | POA: Diagnosis not present

## 2022-02-17 IMAGING — MR MR HEAD WO/W CM
17 of 18 series · 46 of 48 positions shown · IV contrast (gadavist)
Comparison: Head MRI [DATE]

CLINICAL DATA: Dysarthria. Tongue paralysis/weakness (cranial nerve
12).

EXAM:
MRI HEAD WITHOUT AND WITH CONTRAST
TECHNIQUE: Multiplanar, multiecho pulse sequences of the brain and surrounding
structures were obtained without and with intravenous contrast.
CONTRAST:  8.9mL GADAVIST GADOBUTROL 1 MMOL/ML IV SOLN

[Series 5: DWI · axial · 3.0mm · 0.88mm/px · z∈[-79,+71]mm · 5 of 104 slices shown (1 of 4)]
[im 1/104]
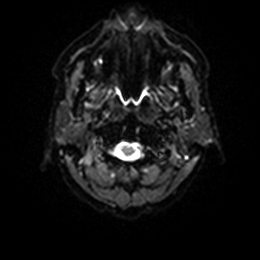
[im 26/104]
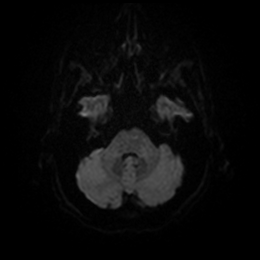
[im 52/104]
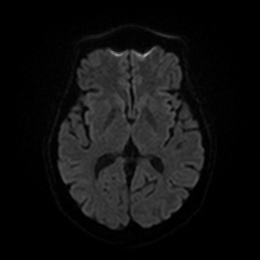
[im 78/104]
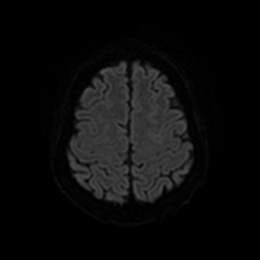
[im 104/104]
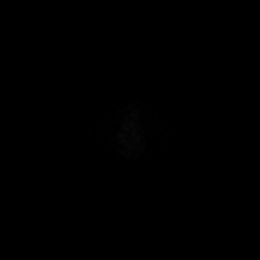

[Series 6: DWI · axial · 3.0mm · 0.88mm/px · z∈[-79,+71]mm · 2 of 49 slices shown (2 of 4)]
[im 1/49]
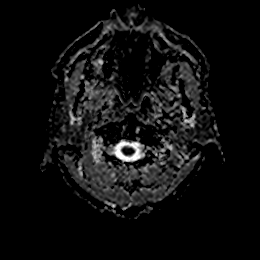
[im 49/49]
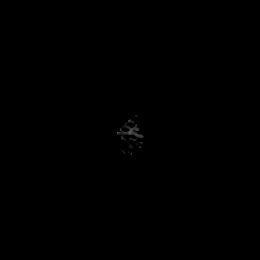

[Series 7: DWI · coronal · 4.0mm · 0.88mm/px · 4 of 74 slices shown (3 of 4)]
[im 1/74]
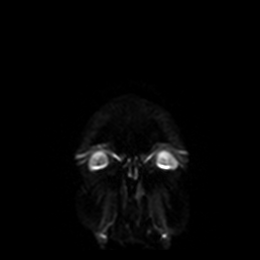
[im 25/74]
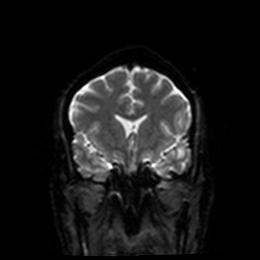
[im 49/74]
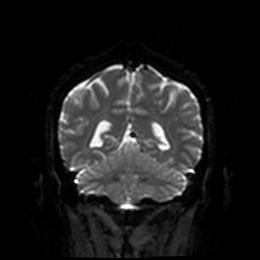
[im 74/74]
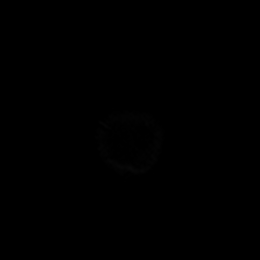

[Series 8: DWI · coronal · 4.0mm · 0.88mm/px · 2 of 37 slices shown (4 of 4)]
[im 1/37]
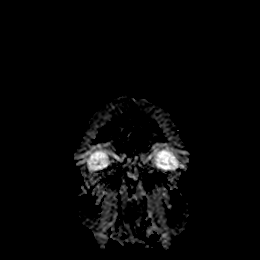
[im 37/37]
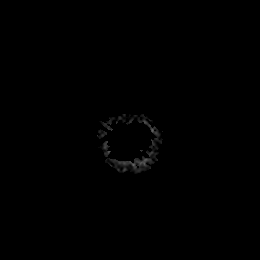

[Series 9: T1 · sagittal · 5.0mm · 0.75mm/px · 1 of 23 slices shown (1 of 3)]
[im 1/23]
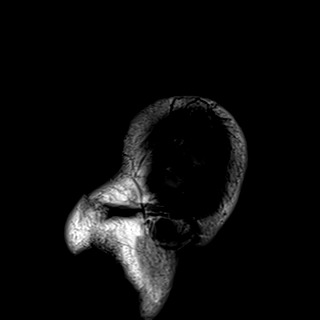

[Series 10: T2 · axial · 5.0mm · 0.72mm/px · 1 of 26 slices shown]
[im 1/26]
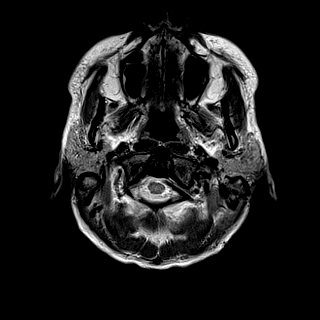

[Series 11: FLAIR · axial · 5.0mm · 0.45mm/px · 1 of 26 slices shown]
[im 1/26]
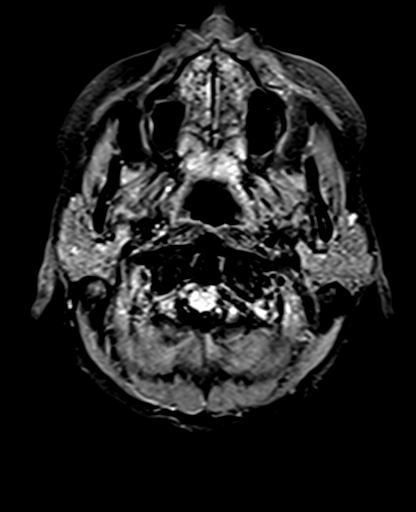

[Series 12: mag_images · axial · 3.0mm · 0.90mm/px · z∈[-80,+70]mm · 3 of 52 slices shown]
[im 1/52]
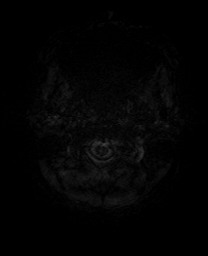
[im 26/52]
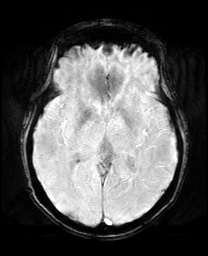
[im 52/52]
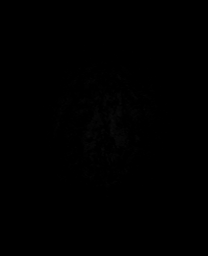

[Series 13: pha_images · axial · 3.0mm · 0.90mm/px · z∈[-80,+64]mm · 2 of 50 slices shown]
[im 1/50]
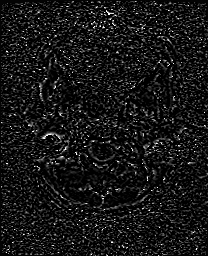
[im 50/50]
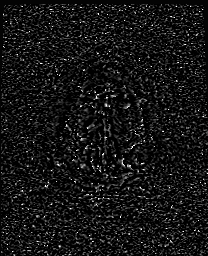

[Series 14: swi_images · axial · 3.0mm · 0.90mm/px · z∈[-80,+70]mm · 3 of 52 slices shown]
[im 1/52]
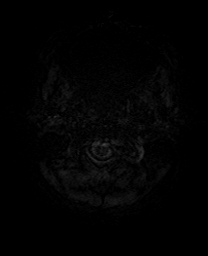
[im 26/52]
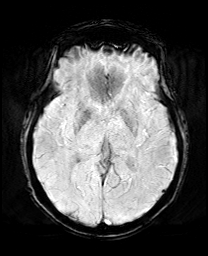
[im 52/52]
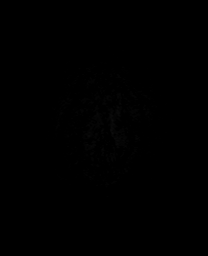

[Series 15: mip_images(sw) · axial · 24.0mm · 0.90mm/px · z∈[-70,+60]mm · 2 of 45 slices shown]
[im 1/45]
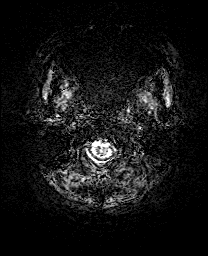
[im 45/45]
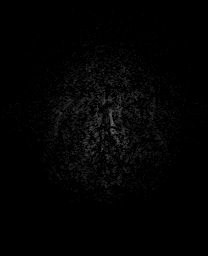

[Series 17: T1 · axial · non-contrast · 3.0mm · 0.37mm/px · 1 of 26 slices shown (2 of 3)]
[im 1/26]
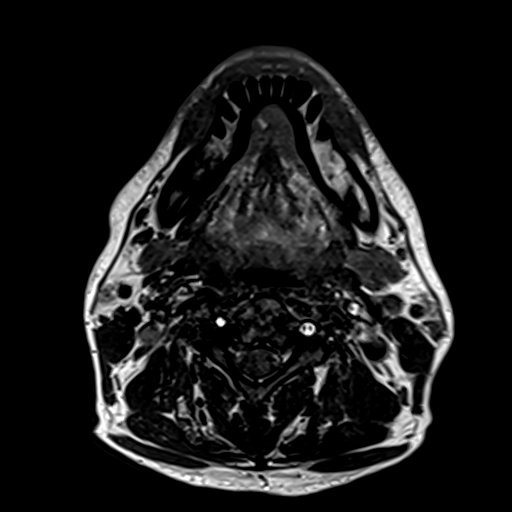

[Series 18: T1 · coronal · 3.0mm · 0.37mm/px · 2 of 42 slices shown (3 of 3)]
[im 1/42]
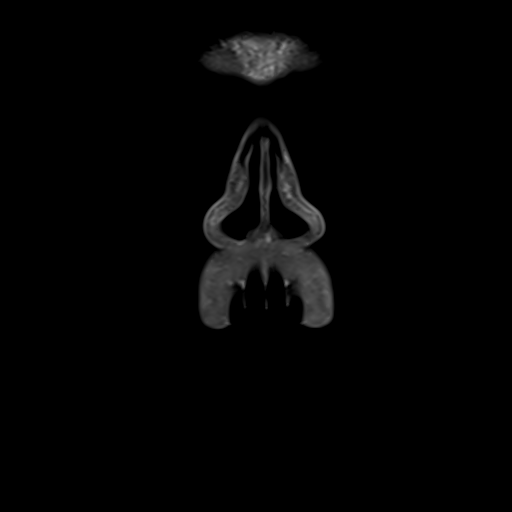
[im 42/42]
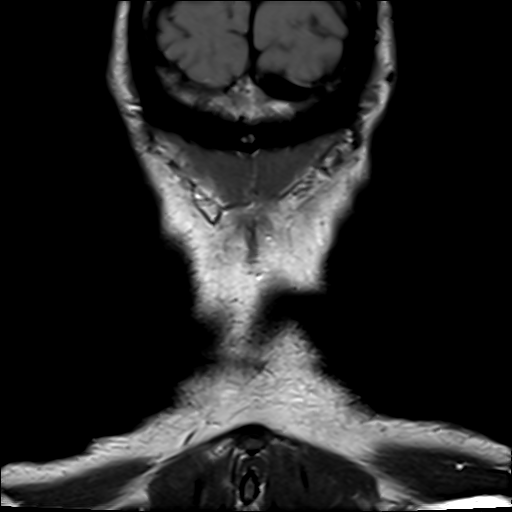

[Series 19: t2_(id)_tra_iso · axial · 1.0mm · 0.56mm/px · z∈[-145,-27]mm · 6 of 120 slices shown]
[im 1/120]
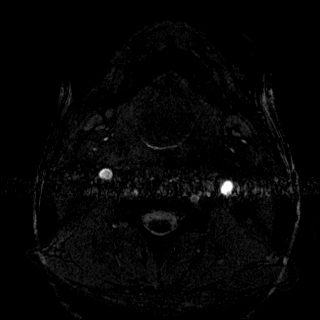
[im 24/120]
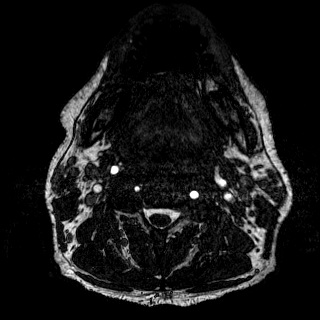
[im 48/120]
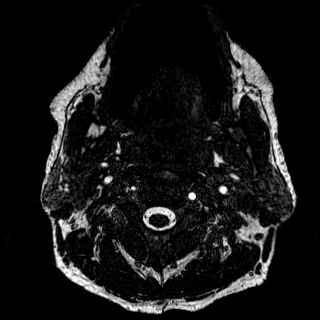
[im 72/120]
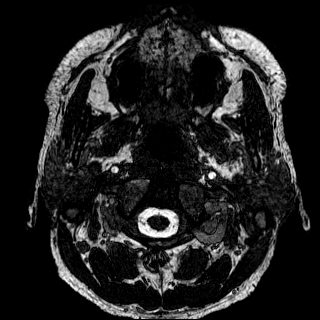
[im 96/120]
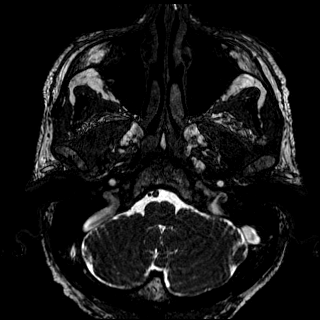
[im 120/120]
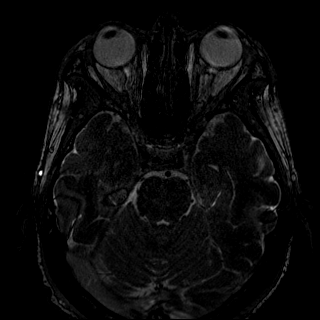

[Series 20: T1 post-contrast · axial · 3.0mm · 0.37mm/px · 1 of 26 slices shown (1 of 2)]
[im 1/26]
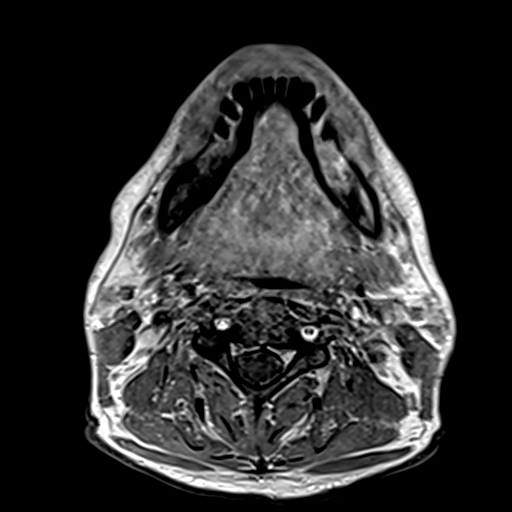

[Series 21: T1 post-contrast · coronal · 3.0mm · 0.37mm/px · 2 of 42 slices shown (2 of 2)]
[im 1/42]
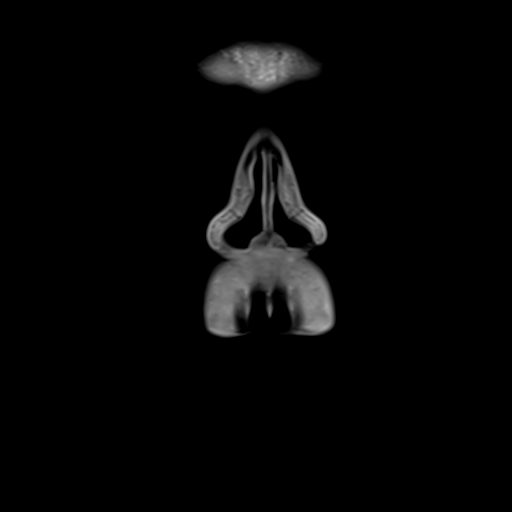
[im 42/42]
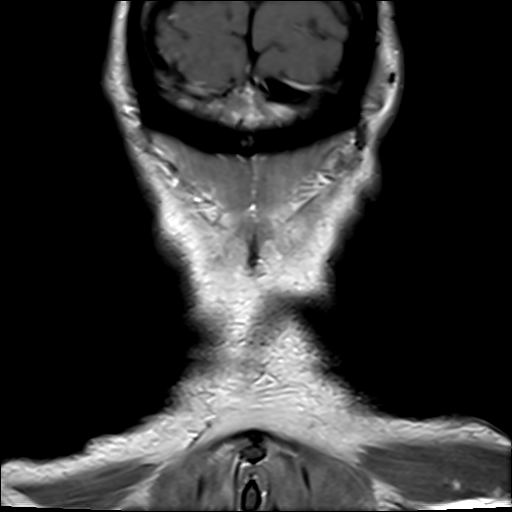

[Series 22: t1_mprage_tra_p2_iso · axial · 1.0mm · 0.98mm/px · z∈[-84,+72]mm · 8 of 160 slices shown]
[im 1/160]
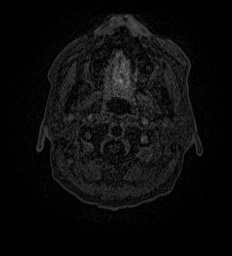
[im 23/160]
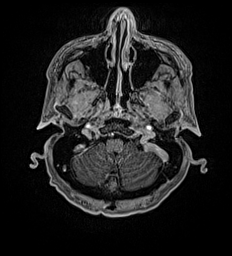
[im 46/160]
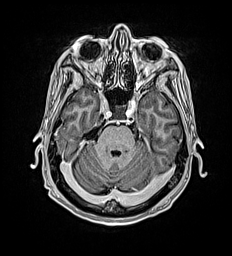
[im 69/160]
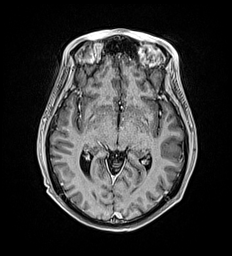
[im 91/160]
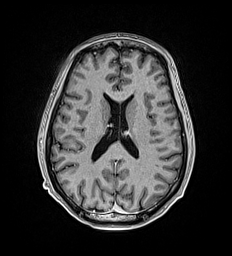
[im 114/160]
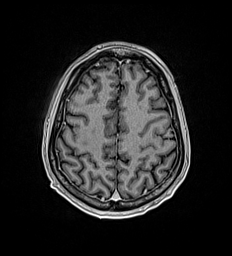
[im 137/160]
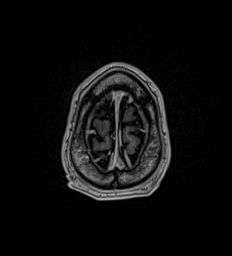
[im 160/160]
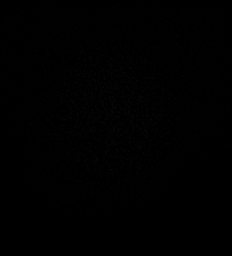

[46 of 48 positions shown; findings below may reference images not displayed]

FINDINGS: Brain: There is no evidence of an acute infarct, intracranial
hemorrhage, mass/mass effect, or extra-axial fluid collection. The
ventricles and sulci are normal. A 1.5 cm focus of nonenhancing T2
FLAIR hyperintensity in the subcortical white matter of the anterior
left temporal lobe is unchanged and without associated mass effect
or volume loss. The brain is normal in signal elsewhere. Dedicated
imaging was performed through the lower brainstem and skull base
with special attention to the hypoglossal canals, and no mass or
abnormal enhancement is identified.

Vascular: Major intracranial vascular flow voids are preserved.

Skull and upper cervical spine: Unremarkable bone marrow signal.

Sinuses/Orbits: Unremarkable orbits. Paranasal sinuses and mastoid
air cells are clear.

Other: None.
IMPRESSION: 1. No acute intracranial abnormality or cause of dysarthria
identified.
2. Unchanged 1.5 cm focus of T2 hyperintensity in the white matter
of the anterior left temporal lobe, indeterminate but could reflect
an area of nonspecific gliosis or focal cortical dysplasia.

## 2022-02-17 MED ORDER — GADOBUTROL 1 MMOL/ML IV SOLN
8.9000 mL | Freq: Once | INTRAVENOUS | Status: AC | PRN
  Administered 2022-02-17: 8.9 mL via INTRAVENOUS

## 2022-02-19 ENCOUNTER — Telehealth: Payer: Self-pay

## 2022-02-19 NOTE — Telephone Encounter (Signed)
-----   Message from Sheliah Hatch, MD sent at 02/19/2022  3:24 PM EDT ----- ?Thankfully MRI is basically normal and unchanged from previous imaging ?

## 2022-02-19 NOTE — Telephone Encounter (Signed)
Spoke w/ pt and informed him of his MRI results.  ?

## 2022-02-22 ENCOUNTER — Ambulatory Visit (HOSPITAL_COMMUNITY)
Admission: RE | Admit: 2022-02-22 | Discharge: 2022-02-22 | Disposition: A | Discharge status: Home / Self Care | Payer: BC Managed Care – PPO | Source: Ambulatory Visit | Attending: Family Medicine | Admitting: Family Medicine

## 2022-02-22 ENCOUNTER — Telehealth: Payer: Self-pay

## 2022-02-22 DIAGNOSIS — R600 Localized edema: Secondary | ICD-10-CM

## 2022-02-22 DIAGNOSIS — I3139 Other pericardial effusion (noninflammatory): Secondary | ICD-10-CM | POA: Diagnosis not present

## 2022-02-22 DIAGNOSIS — I517 Cardiomegaly: Secondary | ICD-10-CM | POA: Diagnosis not present

## 2022-02-22 DIAGNOSIS — R9431 Abnormal electrocardiogram [ECG] [EKG]: Secondary | ICD-10-CM | POA: Insufficient documentation

## 2022-02-22 DIAGNOSIS — I252 Old myocardial infarction: Secondary | ICD-10-CM | POA: Diagnosis not present

## 2022-02-22 LAB — ECHOCARDIOGRAM COMPLETE
S' Lateral: 3.2 cm
Single Plane A2C EF: 75.8 %

## 2022-02-22 NOTE — Addendum Note (Signed)
Addended by: Sheliah Hatch on: 02/22/2022 07:21 AM   Modules accepted: Orders

## 2022-02-22 NOTE — Telephone Encounter (Signed)
Spoke w/ the pt and advised of lab results .

## 2022-02-22 NOTE — Telephone Encounter (Signed)
-----   Message from Midge Minium, MD sent at 02/22/2022  3:49 PM EDT ----- The pumping function of your heart is normal.  The relaxation of your heart is normal.  This is great news!  There is a small amount of fluid around the heart that I don't have an explanation for, but it is not causing any issues with the function of the heart.

## 2022-02-23 ENCOUNTER — Telehealth: Payer: Self-pay

## 2022-02-23 DIAGNOSIS — I3139 Other pericardial effusion (noninflammatory): Secondary | ICD-10-CM

## 2022-02-23 NOTE — Telephone Encounter (Signed)
Spoke w/ pt and advised him that DR Birdie Riddle submitted the referral to Cardiology and that they would reach out to him for an apt.

## 2022-02-23 NOTE — Telephone Encounter (Signed)
Patient's wife called and states [name] needs a referral to cardiologist Dr. Allyson Sabal on [address]..She states she received a call about patient having some fluid on heart. They are accepting new patients.

## 2022-02-23 NOTE — Telephone Encounter (Signed)
Referral placed.  They should reach out to pt to schedule

## 2022-02-23 NOTE — Addendum Note (Signed)
Addended by: Sheliah Hatch on: 02/23/2022 03:13 PM   Modules accepted: Orders

## 2022-02-24 ENCOUNTER — Ambulatory Visit
Admission: RE | Admit: 2022-02-24 | Discharge: 2022-02-24 | Disposition: A | Discharge status: Home / Self Care | Payer: BC Managed Care – PPO | Source: Ambulatory Visit | Attending: Orthopaedic Surgery | Admitting: Orthopaedic Surgery

## 2022-02-24 DIAGNOSIS — G8929 Other chronic pain: Secondary | ICD-10-CM

## 2022-02-24 DIAGNOSIS — M75121 Complete rotator cuff tear or rupture of right shoulder, not specified as traumatic: Secondary | ICD-10-CM | POA: Diagnosis not present

## 2022-02-24 DIAGNOSIS — S46111A Strain of muscle, fascia and tendon of long head of biceps, right arm, initial encounter: Secondary | ICD-10-CM | POA: Diagnosis not present

## 2022-02-24 DIAGNOSIS — M19011 Primary osteoarthritis, right shoulder: Secondary | ICD-10-CM | POA: Diagnosis not present

## 2022-02-24 IMAGING — MR MR SHOULDER*R* W/O CM
4 of 5 series · 21 of 40 positions shown · non-contrast
Comparison: Right shoulder radiographs [DATE]

CLINICAL DATA: Shoulder pain.  Rotator cuff disorder suspected.

EXAM:
MRI OF THE RIGHT SHOULDER WITHOUT CONTRAST
TECHNIQUE: Multiplanar, multisequence MR imaging of the shoulder was performed.
No intravenous contrast was administered.

[Series 8: PD · oblique · right · 4.0mm · 0.22mm/px · 6 of 17 slices shown]
[im 1/17]
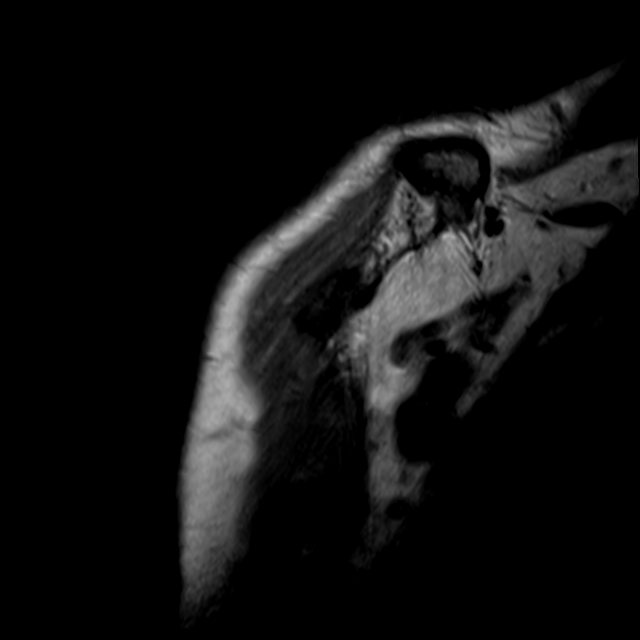
[im 4/17]
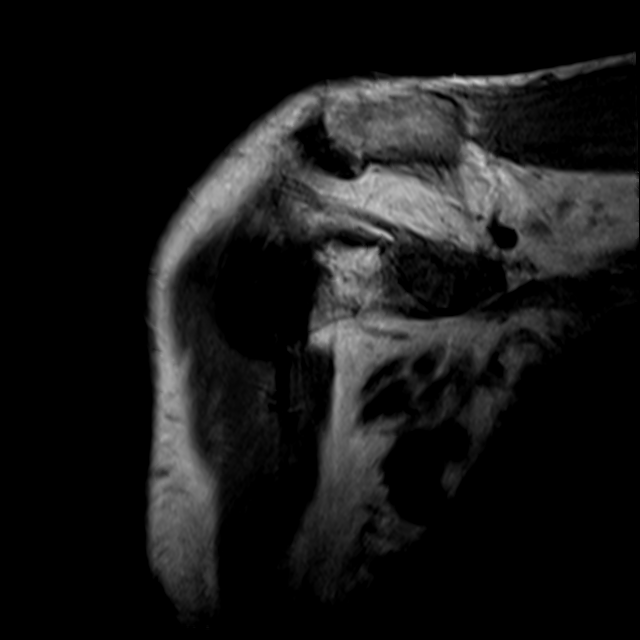
[im 7/17]
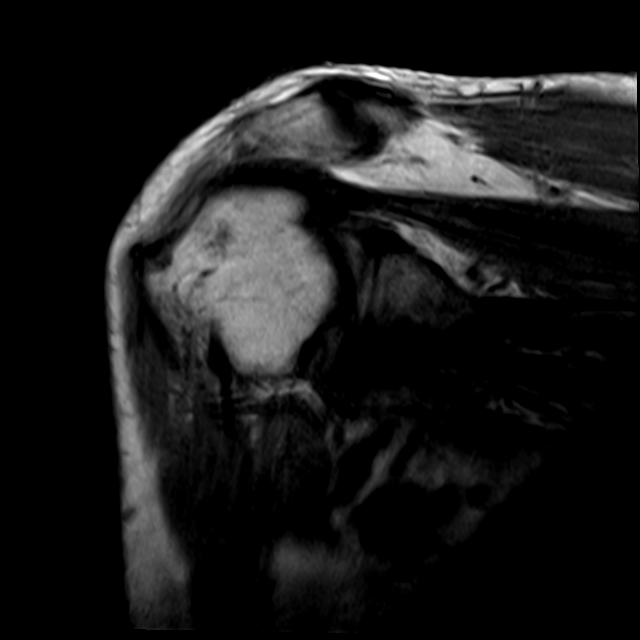
[im 10/17]
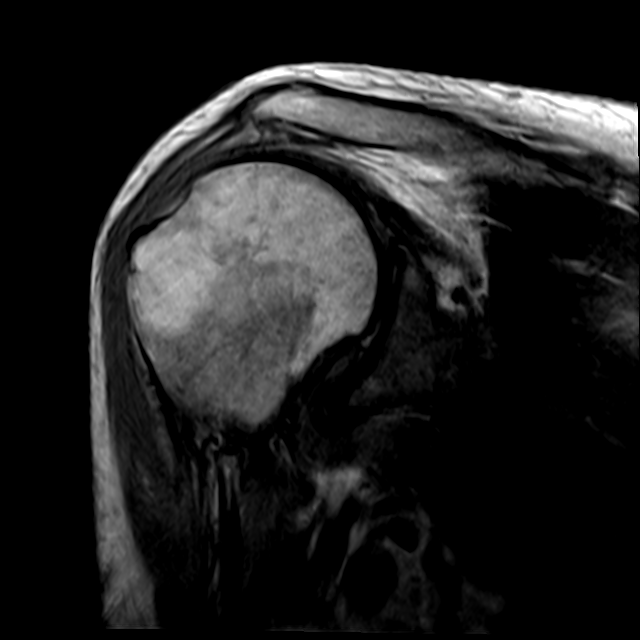
[im 13/17]
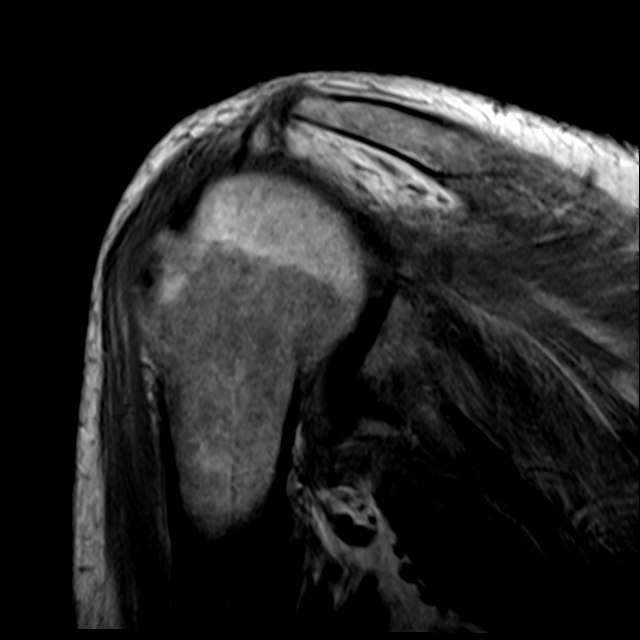
[im 17/17]
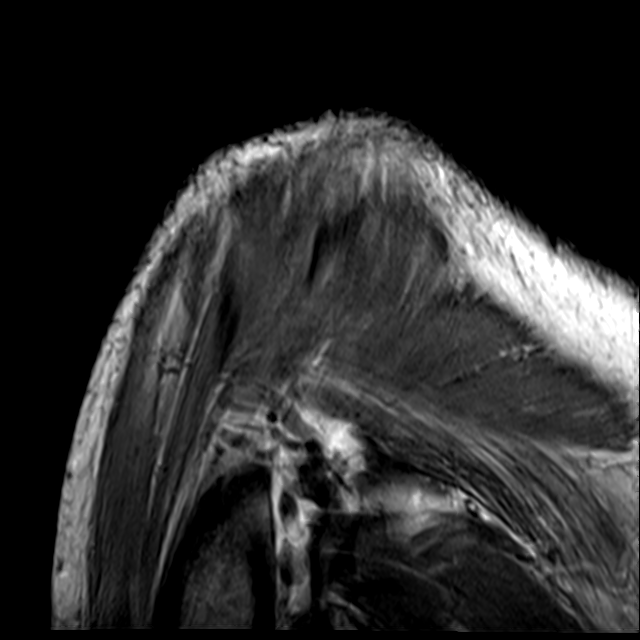

[Series 9: T2 fat-sat · oblique · right · 4.0mm · 0.44mm/px · 9 of 23 slices shown (1 of 3)]
[im 1/23]
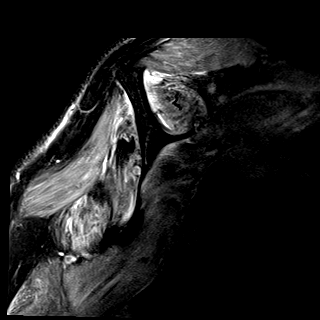
[im 3/23]
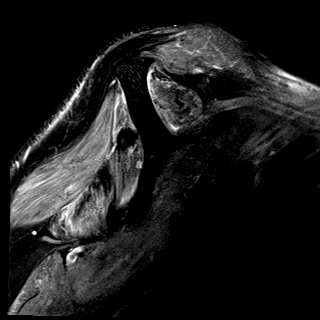
[im 6/23]
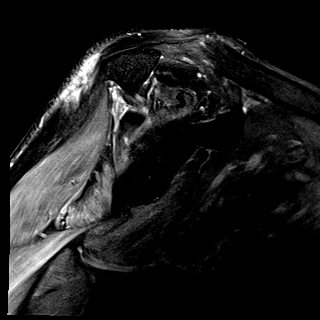
[im 9/23]
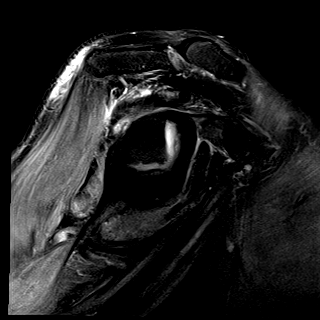
[im 12/23]
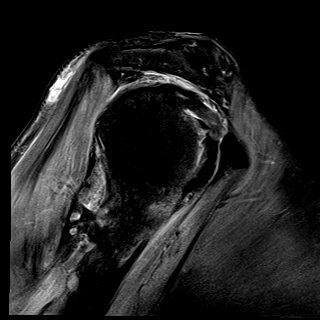
[im 14/23]
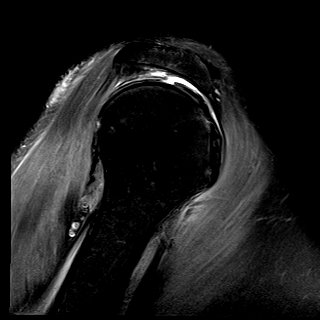
[im 17/23]
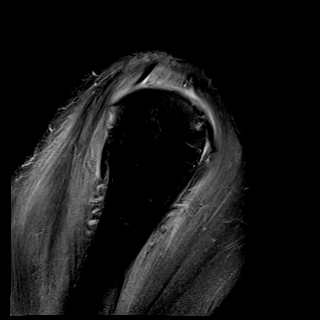
[im 20/23]
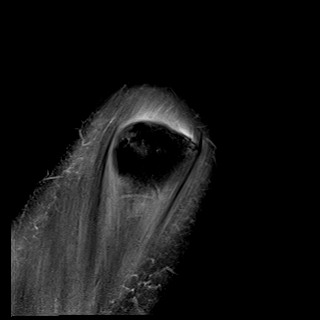
[im 23/23]
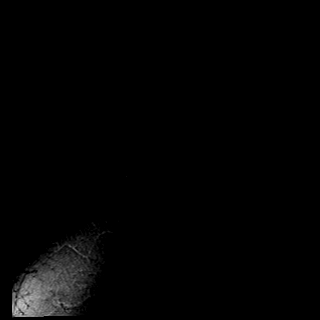

[Series 11: T2 fat-sat · axial · right · 3.0mm · 0.47mm/px · z∈[-31,+50]mm · 3 of 27 slices shown (2 of 3)]
[im 3/27]
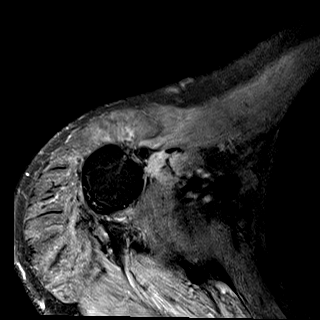
[im 15/27]
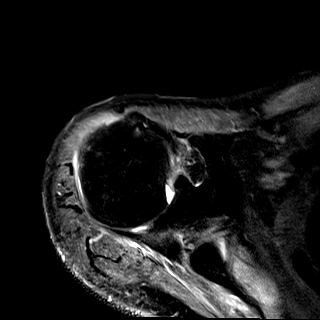
[im 24/27]
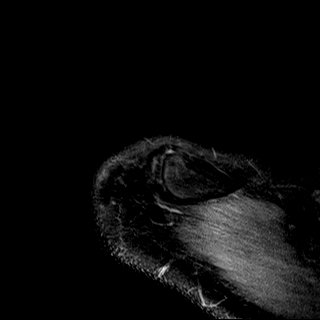

[Series 12: T2 fat-sat · oblique · right · 4.0mm · 0.22mm/px · 3 of 16 slices shown (3 of 3)]
[im 4/16]
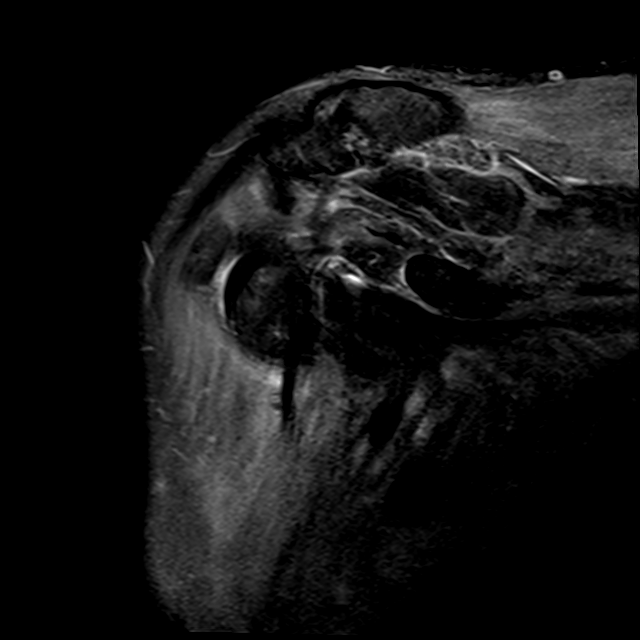
[im 10/16]
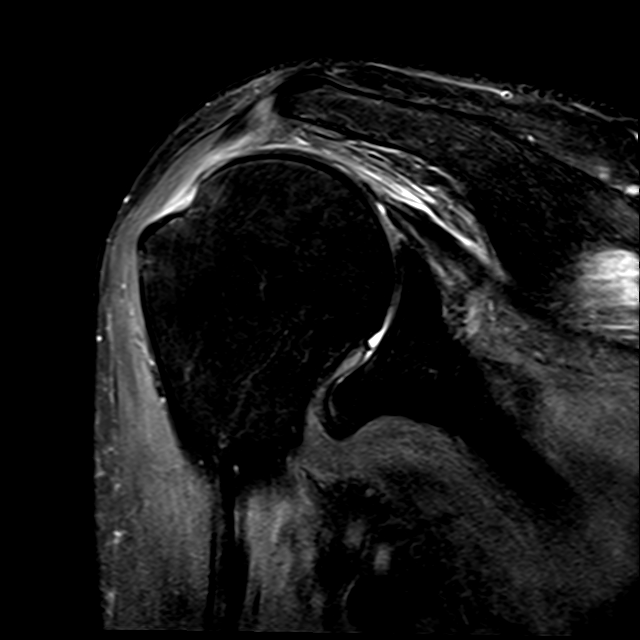
[im 16/16]
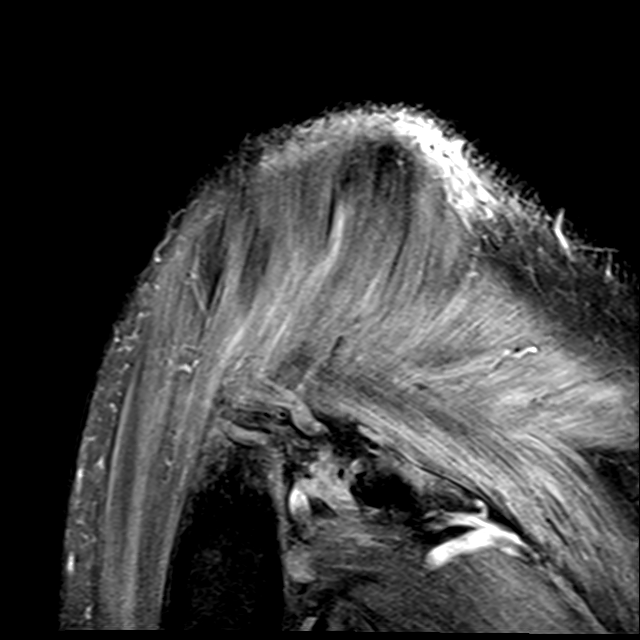

[21 of 40 positions shown; findings below may reference images not displayed]

FINDINGS: Rotator cuff: There is a massive full-thickness tear of the entire
supraspinatus and likely the entire infraspinatus tendon footprints
with tendon retraction at least 4 cm medial to the humeral head apex
with articular sided fibers retracted farther. There is
mild-to-moderate moderate thinning of the superior greater than
inferior aspect of the subscapularis tendon insertion,
partial-thickness tearing. The teres minor is intact.

Muscles: Moderate to severe supraspinatus and moderate
infraspinatus, mild superior subscapularis, and mild teres minor
muscle atrophy. Mild feathery fatty infiltration of the teres minor.
No space-occupying lesion is seen within the quadrilateral space.
Mild edema within the supraspinatus, infraspinatus, and teres minor
muscles.

Biceps long head: Mild-to-moderate attenuation of the proximal long
head of the biceps tendon, likely a partial-thickness tear. The
biceps tendon is partially perched on the anterior superior aspect
of the lesser tuberosity as it courses into the bicipital groove
(axial images 14 and 15), secondary to the subscapularis tendon
partial-thickness tear.

Acromioclavicular Joint: There are mild-to-moderate degenerative
changes of the acromioclavicular joint including joint space
narrowing, subchondral marrow edema, and peripheral osteophytosis.
Type III acromion, with mild downsloping of the anterolateral
acromion.

Glenohumeral Joint: Moderate thinning of the superior glenoid
cartilage. Mild thinning of the inferior medial humeral head
cartilage.

Labrum: Moderate posterosuperior glenoid attenuation and peripheral
degenerative irregularity.

Bones:  No acute fracture.

Other: None.
IMPRESSION: 1. Massive full-thickness tear of the entire supraspinatus tendon
and likely the entire infraspinatus tendon footprint. Moderate to
severe supraspinatus and moderate infraspinatus muscle atrophy.
2. Mild superior greater than inferior subscapularis
partial-thickness tearing.
3. Mild-to-moderate attenuation/partial-thickness tearing of the
proximal long head of the biceps tendon.
4. Mild-to-moderate degenerative changes of the acromioclavicular
joint. Type 3 acromion. Mild-to-moderate glenohumeral cartilage
degenerative changes.

## 2022-02-27 ENCOUNTER — Telehealth: Payer: Self-pay | Admitting: Family Medicine

## 2022-02-27 NOTE — Telephone Encounter (Signed)
I would want to see pt in the office to discuss his sxs prior to just ordering labs

## 2022-02-27 NOTE — Telephone Encounter (Signed)
Pt called in stating asking if he could be tested for lyme disease.    Please advise

## 2022-02-27 NOTE — Telephone Encounter (Signed)
Scheduled for 03/01/22 8:40 am tick bite lyme test

## 2022-02-27 NOTE — Telephone Encounter (Signed)
Can pt have lyme testing without an office visit for supporting rational?

## 2022-02-28 ENCOUNTER — Encounter: Payer: Self-pay | Admitting: Cardiovascular Disease

## 2022-02-28 ENCOUNTER — Ambulatory Visit (INDEPENDENT_AMBULATORY_CARE_PROVIDER_SITE_OTHER): Payer: BC Managed Care – PPO | Admitting: Orthopedic Surgery

## 2022-02-28 ENCOUNTER — Ambulatory Visit (INDEPENDENT_AMBULATORY_CARE_PROVIDER_SITE_OTHER): Payer: BC Managed Care – PPO | Admitting: Cardiovascular Disease

## 2022-02-28 ENCOUNTER — Ambulatory Visit (INDEPENDENT_AMBULATORY_CARE_PROVIDER_SITE_OTHER): Payer: BC Managed Care – PPO

## 2022-02-28 ENCOUNTER — Other Ambulatory Visit: Payer: Self-pay

## 2022-02-28 DIAGNOSIS — G8929 Other chronic pain: Secondary | ICD-10-CM

## 2022-02-28 DIAGNOSIS — R600 Localized edema: Secondary | ICD-10-CM | POA: Diagnosis not present

## 2022-02-28 DIAGNOSIS — M12811 Other specific arthropathies, not elsewhere classified, right shoulder: Secondary | ICD-10-CM

## 2022-02-28 DIAGNOSIS — M12812 Other specific arthropathies, not elsewhere classified, left shoulder: Secondary | ICD-10-CM | POA: Diagnosis not present

## 2022-02-28 DIAGNOSIS — I3139 Other pericardial effusion (noninflammatory): Secondary | ICD-10-CM | POA: Diagnosis not present

## 2022-02-28 DIAGNOSIS — I493 Ventricular premature depolarization: Secondary | ICD-10-CM

## 2022-02-28 MED ORDER — METOPROLOL SUCCINATE ER 25 MG PO TB24: 25.0000 mg | ORAL_TABLET | Freq: Every day | ORAL | 3 refills | Status: DC

## 2022-02-28 MED ORDER — FUROSEMIDE 20 MG PO TABS: 40.0000 mg | ORAL_TABLET | Freq: Every day | ORAL | 3 refills | Status: DC

## 2022-02-28 NOTE — Assessment & Plan Note (Signed)
1+ lower extremity edema without apparent etiology on low-dose diuretic.  I am going to increase his torsemide from 20 to 40 mg a day.  I am going get a 24-hour urine for total protein to see if this is nephrotic.

## 2022-02-28 NOTE — Progress Notes (Unsigned)
Enrolled patient for a 14 day Zio XT  monitor to be mailed to patients home  °

## 2022-02-28 NOTE — Progress Notes (Signed)
02/28/2022 Shuayb Kim   [birth year]  [id number]  Primary Physician Midge Minium, MD Primary Cardiologist: Lorretta Harp MD Lupe Carney, [place]  HPI:  Cameron Kim is a 51 y.o. thin-appearing chronically ill married African-American male with no children who was referred by Dr. Birdie Riddle for a small pericardial effusion.  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  During that time he lost 60 pounds for unclear reasons and now has what appears to be a chronic total body rash with weight loss, muscle mass loss and weakness.  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  His mother has had stents in the past.  He denies chest pain or shortness of breath.  A 2D echocardiogram performed 02/22/2022 revealed normal LV systolic function, mild LVH with no other significant abnormalities.   Current Meds  Medication Sig   furosemide (LASIX) 20 MG tablet Take 1 tablet (20 mg total) by mouth daily.   levETIRAcetam (KEPPRA) 500 MG tablet Take 1 tablet (500 mg total) by mouth 2 (two) times daily.   metoprolol succinate (TOPROL-XL) 50 MG 24 hr tablet Take 50 mg by mouth daily. Take with or immediately following a meal.   RIVAROXABAN (XARELTO) VTE STARTER PACK (15 & 20 MG) Follow package directions: Take one 15mg  tablet by mouth twice a day. On day 22, switch to one 20mg  tablet once a day. Take with food.   traZODone (DESYREL) 50 MG tablet Take 0.5-1 tablets (25-50 mg total) by mouth at bedtime as needed for sleep.   triamcinolone ointment (KENALOG) 0.1 % Apply 1 application. topically 2 (two) times daily.   Vitamin D, Ergocalciferol, (DRISDOL) 1.25 MG (50000 UNIT) CAPS capsule Take 1 capsule (50,000 Units total) by mouth every 7 (seven) days.     No Known Allergies  Social History   Socioeconomic History   Marital status: Married    Spouse name: Not on file   Number of children: Not on file   Years of education: Not on file    Highest education level: Not on file  Occupational History   Not on file  Tobacco Use   Smoking status: Never   Smokeless tobacco: Never  Vaping Use   Vaping Use: Never used  Substance and Sexual Activity   Alcohol use: Yes    Alcohol/week: 3.0 standard drinks    Types: 1 Glasses of wine, 1 Cans of beer, 1 Shots of liquor per week    Comment: socially   Drug use: No   Sexual activity: Yes  Other Topics Concern   Not on file  Social History Narrative   Not on file   Social Determinants of Health   Financial Resource Strain: Not on file  Food Insecurity: Not on file  Transportation Needs: Not on file  Physical Activity: Not on file  Stress: Not on file  Social Connections: Not on file  Intimate Partner Violence: Not on file     Review of Systems: General: negative for chills, fever, night sweats or weight changes.  Cardiovascular: negative for chest pain, dyspnea on exertion, edema, orthopnea, palpitations, paroxysmal nocturnal dyspnea or shortness of breath Dermatological: negative for rash Respiratory: negative for cough or wheezing Urologic: negative for hematuria Abdominal: negative for nausea, vomiting, diarrhea, bright red blood per rectum, melena, or hematemesis Neurologic: negative for visual changes, syncope, or dizziness All other systems reviewed and are otherwise negative except as noted above.  Blood pressure 130/72, pulse (!) 106, height 6\' 2"  (1.88 m), weight 200 lb 3.2 oz (90.8 kg), SpO2 98 %.  General appearance: alert and no distress Neck: no adenopathy, no carotid bruit, no JVD, supple, symmetrical, trachea midline, and thyroid not enlarged, symmetric, no tenderness/mass/nodules Lungs: clear to auscultation bilaterally Heart: regular rate and rhythm, S1, S2 normal, no murmur, click, rub or gallop Extremities: 1+ pitting edema bilaterally Pulses: 2+ and symmetric Skin: Skin color, texture, turgor normal. No rashes or lesions Neurologic: Grossly  normal  EKG sinus tachycardia at 106 with bigeminal PVCs.  Personally reviewed this EKG.  ASSESSMENT AND PLAN:   Lower extremity edema 1+ lower extremity edema without apparent etiology on low-dose diuretic.  I am going to increase his torsemide from 20 to 40 mg a day.  I am going get a 24-hour urine for total protein to see if this is nephrotic.  Frequent PVCs Frequent PVCs in a bigeminal pattern on his EKG.  I am going to put him back on Toprol-XL 25 mg and get a 2-week Zio patch to further evaluate  Pericardial effusion Small pericardial effusion on echo performed 02/22/2022 which is probably inconsequential.     Lorretta Harp MD [facility], [facility] 02/28/2022 10:11 AM

## 2022-02-28 NOTE — Patient Instructions (Signed)
Medication Instructions:   -Start taking metoprolol succinate (toprol-xl) 25mg  once daily.  -Increase furosemide (lasix) to 40mg  once daily.   *If you need a refill on your cardiac medications before your next appointment, please call your pharmacy*   Lab Work: Your physician recommends that you return for lab work in: 2 week for BMET  If you have labs (blood work) drawn today and your tests are completely normal, you will receive your results only by: [place] (if you have MyChart) OR A paper copy in the mail If you have any lab test that is abnormal or we need to change your treatment, we will call you to review the results.   Testing/Procedures:  [facility]- Long Term Monitor Instructions  Your physician has requested you wear a ZIO patch monitor for 14 days.  This is a single patch monitor. [employer] supplies one patch monitor per enrollment. Additional stickers are not available. Please do not apply patch if you will be having a Nuclear Stress Test,  Echocardiogram, Cardiac CT, MRI, or Chest Xray during the period you would be wearing the  monitor. The patch cannot be worn during these tests. You cannot remove and re-apply the  ZIO XT patch monitor.  Your ZIO patch monitor will be mailed 3 day USPS to your address on file. It may take 3-5 days  to receive your monitor after you have been enrolled.  Once you have received your monitor, please review the enclosed instructions. Your monitor  has already been registered assigning a specific monitor serial # to you.  Billing and Patient Assistance Program Information  We have supplied [employer] with any of your insurance information on file for billing purposes. [employer] offers a sliding scale Patient Assistance Program for patients that do not have  insurance, or whose insurance does not completely cover the cost of the ZIO monitor.  You must apply for the Patient Assistance Program to qualify for this discounted rate.  To  apply, please call [employer] at [phone], select option 4, select option 2, ask to apply for  Patient Assistance Program. Theodore Demark will ask your household income, and how many people  are in your household. They will quote your out-of-pocket cost based on that information.  [employer] will also be able to set up a 31-month, interest-free payment plan if needed.  Applying the monitor   Shave hair from upper left chest.  Hold abrader disc by orange tab. Rub abrader in 40 strokes over the upper left chest as  indicated in your monitor instructions.  Clean area with 4 enclosed alcohol pads. Let dry.  Apply patch as indicated in monitor instructions. Patch will be placed under collarbone on left  side of chest with arrow pointing upward.  Rub patch adhesive wings for 2 minutes. Remove white label marked "1". Remove the white  label marked "2". Rub patch adhesive wings for 2 additional minutes.  While looking in a mirror, press and release button in center of patch. A small green light will  flash 3-4 times. This will be your only indicator that the monitor has been turned on.  Do not shower for the first 24 hours. You may shower after the first 24 hours.  Press the button if you feel a symptom. You will hear a small click. Record Date, Time and  Symptom in the Patient Logbook.  When you are ready to remove the patch, follow instructions on the last 2 pages of Patient  Logbook. Stick patch monitor onto the  last page of Patient Logbook.  Place Patient Logbook in the blue and white box. Use locking tab on box and tape box closed  securely. The blue and white box has prepaid postage on it. Please place it in the mailbox as  soon as possible. Your physician should have your test results approximately 7 days after the  monitor has been mailed back to [facility].  Call [place] at [phone] if you have questions regarding  your ZIO XT patch monitor. Call them immediately if  you see an orange light blinking on your  monitor.  If your monitor falls off in less than 4 days, contact our Monitor department at [phone].  If your monitor becomes loose or falls off after 4 days call [employer] at [phone] for  suggestions on securing your monitor    Follow-Up: At [facility], you and your health needs are our priority.  As part of our continuing mission to provide you with exceptional heart care, we have created designated Provider Care Teams.  These Care Teams include your primary Cardiologist (physician) and Advanced Practice Providers (APPs -  Physician Assistants and Nurse Practitioners) who all work together to provide you with the care you need, when you need it.  We recommend signing up for the patient portal called "MyChart".  Sign up information is provided on this After Visit Summary.  MyChart is used to connect with patients for Virtual Visits (Telemedicine).  Patients are able to view lab/test results, encounter notes, upcoming appointments, etc.  Non-urgent messages can be sent to your provider as well.   To learn more about what you can do with MyChart, go to [email or web address].    Your next appointment:   3 month(s)  The format for your next appointment:   In Person  Provider:   Quay Burow, MD

## 2022-02-28 NOTE — Assessment & Plan Note (Signed)
Small pericardial effusion on echo performed 02/22/2022 which is probably inconsequential.

## 2022-02-28 NOTE — Assessment & Plan Note (Signed)
Frequent PVCs in a bigeminal pattern on his EKG.  I am going to put him back on Toprol-XL 25 mg and get a 2-week Zio patch to further evaluate

## 2022-03-01 ENCOUNTER — Other Ambulatory Visit: Payer: Self-pay | Admitting: Cardiovascular Disease

## 2022-03-01 ENCOUNTER — Ambulatory Visit (INDEPENDENT_AMBULATORY_CARE_PROVIDER_SITE_OTHER): Payer: BC Managed Care – PPO | Admitting: Family Medicine

## 2022-03-01 ENCOUNTER — Encounter: Payer: Self-pay | Admitting: Orthopedic Surgery

## 2022-03-01 ENCOUNTER — Encounter: Payer: Self-pay | Admitting: Family Medicine

## 2022-03-01 VITALS — BP 122/80 | HR 98 | Temp 98.2°F | Resp 16 | Ht 74.0 in | Wt 201.8 lb

## 2022-03-01 DIAGNOSIS — D649 Anemia, unspecified: Secondary | ICD-10-CM

## 2022-03-01 DIAGNOSIS — R21 Rash and other nonspecific skin eruption: Secondary | ICD-10-CM | POA: Diagnosis not present

## 2022-03-01 DIAGNOSIS — R600 Localized edema: Secondary | ICD-10-CM | POA: Diagnosis not present

## 2022-03-01 DIAGNOSIS — R29898 Other symptoms and signs involving the musculoskeletal system: Secondary | ICD-10-CM | POA: Diagnosis not present

## 2022-03-01 DIAGNOSIS — I493 Ventricular premature depolarization: Secondary | ICD-10-CM | POA: Diagnosis not present

## 2022-03-01 DIAGNOSIS — S46011A Strain of muscle(s) and tendon(s) of the rotator cuff of right shoulder, initial encounter: Secondary | ICD-10-CM

## 2022-03-01 DIAGNOSIS — S46012A Strain of muscle(s) and tendon(s) of the rotator cuff of left shoulder, initial encounter: Secondary | ICD-10-CM

## 2022-03-01 DIAGNOSIS — I3139 Other pericardial effusion (noninflammatory): Secondary | ICD-10-CM | POA: Diagnosis not present

## 2022-03-01 MED ORDER — PREDNISONE 10 MG PO TABS: ORAL_TABLET | ORAL | 0 refills | Status: DC

## 2022-03-01 NOTE — Patient Instructions (Signed)
Follow up as needed or as scheduled START the Prednisone as directed- take w/ food.  3 tabs at the same time x3 days, and then 2 tabs at the same time x3 days, and then 1 tab daily Continue to work on healthy diet and eating regularly Keep up the good work in physical therapy!  You look like a whole new person!!! Call with any questions or concerns Hang in there!!!

## 2022-03-01 NOTE — Progress Notes (Signed)
Office Visit Note   Patient: Cameron Kim           Date of Birth: 06-Apr-1971           MRN: [id number] Visit Date: 02/28/2022 Requested by: Sheliah Hatch, MD [address] [place],  [place] [address] PCP: Sheliah Hatch, MD  Subjective: Chief Complaint  Patient presents with   Right Shoulder - Follow-up    HPI: Cameron Kim is a 51 year old patient with bilateral shoulder pain.  She had an MRI scan recently of the right shoulder which shows moderate arthritis in the joint as well as retracted rotator cuff tears of the supraspinatus and infraspinatus with atrophy.  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  This required a lot of heavy lifting.  Was released from jail on May 2.  Hard for him to raise his arms.  He also reports recent weight loss.  Both shoulders are painful but the right is slightly more painful than the left.              ROS: All systems reviewed are negative as they relate to the chief complaint within the history of present illness.  Patient denies  fevers or chills.   Assessment & Plan: Visit Diagnoses:  1. Rotator cuff arthropathy of both shoulders     Plan: Impression is rotator cuff arthropathy bilateral shoulders which appears chronic.  He also is having some other medical problems with generalized edema and weight loss.  From a surgical perspective based on review of the MRI scan and clinical examination he is probably more a candidate for reverse replacement as opposed to tendon transfer because of the arthritis in the shoulder and stiffness.  Before considering any intervention we would like to send him to therapy for 6 weeks to work on shoulder range of motion and also have him see his medical doctor to try to sort out the cause of his weight loss and generalized edema that he is having.  See him back in 6 weeks after physical therapy.  Follow-Up Instructions: Return in about 6 weeks (around  04/11/2022).   Orders:  No orders of the defined types were placed in this encounter.  No orders of the defined types were placed in this encounter.     Procedures: No procedures performed   Clinical Data: No additional findings.  Objective: Vital Signs: There were no vitals taken for this visit.  Physical Exam:   Constitutional: Patient appears well-developed HEENT:  Head: Normocephalic Eyes:EOM are normal Neck: Normal range of motion Cardiovascular: Normal rate Pulmonary/chest: Effort normal Neurologic: Patient is alert Skin: Skin is warm Psychiatric: Patient has normal mood and affect   Ortho Exam: Ortho exam demonstrates pretty reasonable cervical spine range of motion.  Has atrophy bilaterally in the infraspinatus and supraspinatus fossa.  Passive range of motion on the right is 20/60/70.  Passive range of motion on the left is 20/70/85.  Does have rotator cuff weakness to infraspinatus and supraspinatus testing.  Subscap strength is fairly reasonable bilaterally.  There is sensory function to the hand is intact.  No calf tenderness negative Homans bilaterally he does have some generalized edema in bilateral upper and lower extremities.  Specialty Comments:  No specialty comments available.  Imaging: No results found.   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   Unintentional  weight loss 02/11/2022   Lower extremity edema 02/11/2022   Dysarthria 02/11/2022   Weakness generalized 02/11/2022   Chronic pain of both shoulders 02/11/2022   Dry skin 02/11/2022   History of non-ST elevation myocardial infarction (NSTEMI) 02/11/2022   Acute deep vein thrombosis (DVT) of lower extremity (HCC) 05/02/2021   Seizure disorder (HCC) 11/07/2020   Epistaxis 06/15/2020   Erectile dysfunction 10/31/2017   Adjustment disorder with depressed mood 03/25/2017   Glucose intolerance 02/25/2017    Physical exam 12/03/2014   Right bundle branch block and left anterior fascicular block 01/18/2014   Hx of pulmonary embolus 01/13/2014   Soft tissue mass 01/13/2014   Obesity (BMI 30-39.9) 01/13/2014   Allergic rhinitis 01/13/2014   Past Medical History:  Diagnosis Date   Abnormal EKG    Ankle fracture, left    Peripheral vascular disease (HCC) 2013   dvt-pe after ankle fx   Pulmonary embolism (HCC) 05/2012   Seizure disorder (HCC) 11/07/2020    Family History  Problem Relation Age of Onset   Diabetes Mother    Hypertension Mother    Heart murmur Mother    Stroke Mother    Diabetes Father    Hypertension Father    Stroke Sister    Diabetes Paternal Grandmother    Diabetes Paternal Grandfather     Past Surgical History:  Procedure Laterality Date   I & D EXTREMITY Right 06/07/2016   Procedure: IRRIGATION AND DEBRIDEMENT RIGHT WRIST,MEDIAN NERVE EXPLORATION NEUROLYSIS AND REPAIR;  Surgeon: Dominica Severin, MD;  Location: [facility] OR;  Service: Orthopedics;  Laterality: Right;   medial nerve surgry right arm     NO PAST SURGERIES     Social History   Occupational History   Not on file  Tobacco Use   Smoking status: Never   Smokeless tobacco: Never  Vaping Use   Vaping Use: Never used  Substance and Sexual Activity   Alcohol use: Yes    Alcohol/week: 3.0 standard drinks    Types: 1 Glasses of wine, 1 Cans of beer, 1 Shots of liquor per week    Comment: socially   Drug use: No   Sexual activity: Yes

## 2022-03-01 NOTE — Progress Notes (Signed)
   Subjective:    Patient ID: Cameron Kim, male    DOB: 07/24/1971, 51 y.o.   MRN: [id number]  HPI Torn rotator cuff- bilateral.  Pt have little to no use of R arm and severely limited use of L arm.  Due to this is not able to work and needs assistance daily.  Skin rash- pt has fine maculopapular rash all over body.  No improvement w/ triamcinolone.  Very itchy, burns.  Started while incarcerated.  Pt reports it has been present for 'at least 6 months'.  Leg weakness- bilateral.  Pt admits that he spent most of his time in bed while incarcerated. Knows there's a component of deconditioning    Review of Systems For ROS see HPI     Objective:   Physical Exam Vitals reviewed.  Constitutional:      General: He is not in acute distress.    Appearance: He is not ill-appearing.  HENT:     Head: Normocephalic and atraumatic.  Musculoskeletal:     Comments: Almost no use of R arm, very limited use of L arm  Skin:    General: Skin is warm and dry.     Findings: Rash (fine maculopapular rash all over chest, back, arms, and legs) present.  Neurological:     Mental Status: He is alert and oriented to person, place, and time.     Gait: Gait (gait is nearly normal compared w/ last visit) normal.     Comments: Speech is much improved          Assessment & Plan:  Rotator cuff tears bilaterally- ongoing issue for pt.  Seeing Ortho.  Unfortunately given his lack of strength and ROM he has essentially no use of either arm.  Based on this, he needs assistance w/ ADLs and is unable to work at this time.  Rash- ongoing issue for pt x6 months.  No improvement w/ Triamcinolone.  Will start Prednisone taper and monitor for improvement.  Pt expressed understanding and is in agreement w/ plan.   Leg Weakness- ongoing issue for pt.  There seems to be a component of deconditioning but his sxs seem excessive of just deconditioning.  Encouraged him to continue PT and we will continue to follow.  Low  Hgb- check iFOB and refer to GI prn.

## 2022-03-02 ENCOUNTER — Telehealth: Payer: Self-pay | Admitting: Family Medicine

## 2022-03-02 ENCOUNTER — Other Ambulatory Visit: Payer: Self-pay | Admitting: Registered Nurse

## 2022-03-02 ENCOUNTER — Other Ambulatory Visit: Payer: BC Managed Care – PPO

## 2022-03-02 DIAGNOSIS — W57XXXA Bitten or stung by nonvenomous insect and other nonvenomous arthropods, initial encounter: Secondary | ICD-10-CM

## 2022-03-02 LAB — PROTEIN, URINE, 24 HOUR
Protein, 24H Urine: 426 mg/24 hr — ABNORMAL HIGH (ref 30–150)
Protein, Ur: 28.4 mg/dL

## 2022-03-02 MED ORDER — DOXYCYCLINE HYCLATE 100 MG PO TABS: 100.0000 mg | ORAL_TABLET | Freq: Two times a day (BID) | ORAL | 0 refills | Status: DC

## 2022-03-02 NOTE — Telephone Encounter (Signed)
PT had appt yesterday with Dr.Tabori, he stating that forgot to show her his snider bite. PT need antibiotic for the bite.

## 2022-03-06 ENCOUNTER — Other Ambulatory Visit (INDEPENDENT_AMBULATORY_CARE_PROVIDER_SITE_OTHER): Payer: BC Managed Care – PPO

## 2022-03-06 ENCOUNTER — Telehealth: Payer: Self-pay | Admitting: Family Medicine

## 2022-03-06 DIAGNOSIS — D649 Anemia, unspecified: Secondary | ICD-10-CM | POA: Diagnosis not present

## 2022-03-06 LAB — FECAL OCCULT BLOOD, IMMUNOCHEMICAL: Fecal Occult Bld: NEGATIVE

## 2022-03-06 MED ORDER — RIVAROXABAN (XARELTO) VTE STARTER PACK (15 & 20 MG): ORAL_TABLET | ORAL | 0 refills | Status: DC

## 2022-03-06 NOTE — Telephone Encounter (Signed)
Pt called in asking for a refill on the Xarelto to be sent into the [facility] on [address]   Please advise

## 2022-03-06 NOTE — Telephone Encounter (Signed)
Is there any way that he can take a picture of the bite for me to evaluate the area and determine if antibiotics are needed?

## 2022-03-07 DIAGNOSIS — I493 Ventricular premature depolarization: Secondary | ICD-10-CM

## 2022-03-09 ENCOUNTER — Telehealth: Payer: Self-pay

## 2022-03-09 NOTE — Telephone Encounter (Signed)
Pt called back and informed.

## 2022-03-09 NOTE — Telephone Encounter (Signed)
-----   Message from Midge Minium, MD sent at 03/06/2022  9:07 AM EDT ----- No evidence of GI blood loss.  This is great news!!!

## 2022-03-12 ENCOUNTER — Other Ambulatory Visit: Payer: Self-pay

## 2022-03-12 MED ORDER — RIVAROXABAN 15 MG PO TABS: 15.0000 mg | ORAL_TABLET | Freq: Two times a day (BID) | ORAL | 1 refills | Status: DC

## 2022-03-12 NOTE — Telephone Encounter (Signed)
Left pt a Vm stating I sent in his correct dosage .

## 2022-03-12 NOTE — Telephone Encounter (Signed)
Pt called in stating that he has been on xarelto for some time now. He wanted to know why the start up pack was sent in. Pt states that that medication is going to cost him $50.00. pt can be reached at the home # and he uses [facility] on [address].

## 2022-03-13 ENCOUNTER — Telehealth: Payer: Self-pay

## 2022-03-13 NOTE — Telephone Encounter (Signed)
Left  a vm on pt phone and wife phone stating that the pt Xarelto 15 mg was approved by insurance today . I advised them to call the pharmacy to fill.

## 2022-03-14 ENCOUNTER — Ambulatory Visit (INDEPENDENT_AMBULATORY_CARE_PROVIDER_SITE_OTHER): Payer: BC Managed Care – PPO | Admitting: Physical Therapy

## 2022-03-14 ENCOUNTER — Encounter: Payer: Self-pay | Admitting: Physical Therapy

## 2022-03-14 DIAGNOSIS — M25611 Stiffness of right shoulder, not elsewhere classified: Secondary | ICD-10-CM | POA: Diagnosis not present

## 2022-03-14 DIAGNOSIS — M25612 Stiffness of left shoulder, not elsewhere classified: Secondary | ICD-10-CM | POA: Diagnosis not present

## 2022-03-14 DIAGNOSIS — M25511 Pain in right shoulder: Secondary | ICD-10-CM

## 2022-03-14 DIAGNOSIS — R600 Localized edema: Secondary | ICD-10-CM

## 2022-03-14 DIAGNOSIS — G8929 Other chronic pain: Secondary | ICD-10-CM

## 2022-03-14 DIAGNOSIS — M25512 Pain in left shoulder: Secondary | ICD-10-CM

## 2022-03-14 DIAGNOSIS — M6281 Muscle weakness (generalized): Secondary | ICD-10-CM

## 2022-03-14 NOTE — Therapy (Signed)
OUTPATIENT PHYSICAL THERAPY SHOULDER EVALUATION   Patient Name: Cameron Kim MRN: [id number] DOB:04/15/1971, 51 y.o., male Today's Date: 03/14/2022   PT End of Session - 03/14/22 1140     Visit Number 1    Number of Visits 12    Date for PT Re-Evaluation 04/25/22    PT Start Time 1100    PT Stop Time 1138    PT Time Calculation (min) 38 min    Activity Tolerance Patient tolerated treatment well    Behavior During Therapy [facility] for tasks assessed/performed             Past Medical History:  Diagnosis Date   Abnormal EKG    Ankle fracture, left    Peripheral vascular disease (HCC) 2013   dvt-pe after ankle fx   Pulmonary embolism (HCC) 05/2012   Seizure disorder (HCC) 11/07/2020   Past Surgical History:  Procedure Laterality Date   I & D EXTREMITY Right 06/07/2016   Procedure: IRRIGATION AND DEBRIDEMENT RIGHT WRIST,MEDIAN NERVE EXPLORATION NEUROLYSIS AND REPAIR;  Surgeon: Dominica Severin, MD;  Location: [facility] OR;  Service: Orthopedics;  Laterality: Right;   medial nerve surgry right arm     NO PAST SURGERIES     Patient Active Problem List   Diagnosis Date Noted   Pericardial effusion 02/28/2022   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   Unintentional weight loss 02/11/2022   Lower extremity edema 02/11/2022   Dysarthria 02/11/2022   Weakness generalized 02/11/2022   Chronic pain of both shoulders 02/11/2022   Dry skin 02/11/2022   History of non-ST elevation myocardial infarction (NSTEMI) 02/11/2022   Acute deep vein thrombosis (DVT) of lower extremity (HCC) 05/02/2021   Seizure disorder (HCC) 11/07/2020   Epistaxis 06/15/2020   Erectile dysfunction 10/31/2017   Adjustment disorder with depressed mood 03/25/2017   Glucose intolerance 02/25/2017   Physical exam 12/03/2014   Right bundle branch block and left anterior fascicular block 01/18/2014   Hx of pulmonary embolus 01/13/2014   Soft tissue mass 01/13/2014   Obesity (BMI 30-39.9) 01/13/2014   Allergic rhinitis 01/13/2014     PCP: Sheliah Hatch, MD  REFERRING PROVIDER: Cammy Copa, MD  REFERRING DIAG: M25.511,G89.29,M25.512 (ICD-10-CM) - Chronic pain of both shoulders  THERAPY DIAG:  Chronic right shoulder pain  Chronic left shoulder pain  Stiffness of right shoulder, not elsewhere classified  Stiffness of left shoulder, not elsewhere classified  Muscle weakness (generalized)  Localized edema  Rationale for Evaluation and Treatment Rehabilitation  ONSET DATE: chronic pain and stiffness  SUBJECTIVE:  SUBJECTIVE STATEMENT:He says bilateral shoulder pain since July 2022 that started after incident with police that tore his RTC. He says he has no motion in his shoulders and cant lift his arms   PERTINENT HISTORY: OA and Rt RTC tear noted in MRI, likely will need reverse TSA per MD  PAIN:  Are you having pain? Yes: NPRS scale: 6/10 Pain location: "deep in the ball" Pain description: ache Aggravating factors: laying on Rt side, raising or reaching arms Relieving factors: none  PRECAUTIONS: ROM vs strength per MD recommendations  WEIGHT BEARING RESTRICTIONS No  FALLS:  Has patient fallen in last 6 months? Yes. Number of falls 1, due to bee coming at him  OCCUPATION: None at the moment  PLOF: Independent  PATIENT GOALS improve motion and pain in shoulders  OBJECTIVE:   DIAGNOSTIC FINDINGS:  Rt shoulder MRI 1. Massive full-thickness tear of the entire supraspinatus tendon and likely the entire infraspinatus tendon footprint. Moderate to severe supraspinatus and moderate infraspinatus muscle atrophy. 2. Mild superior greater than inferior subscapularis partial-thickness tearing. 3. Mild-to-moderate attenuation/partial-thickness tearing of the proximal long head of the biceps tendon. 4.  Mild-to-moderate degenerative changes of the acromioclavicular joint. Type 3 acromion. Mild-to-moderate glenohumeral cartilage degenerative changes.  PATIENT SURVEYS:  FOTO 44% functional intake at eval  COGNITION:  Overall cognitive status: Within functional limits for tasks assessed     SENSATION: WFL  POSTURE: Slumped posture  UPPER EXTREMITY ROM:   AROM in standing/PROM in supine Right eval Left eval  Shoulder flexion 20/90 50/95  Shoulder extension    Shoulder abduction 40/70 60/75  Shoulder adduction    Shoulder internal rotation 20/30 30/40  Shoulder external rotation 10/38 20/35  Elbow flexion    Elbow extension    Wrist flexion    Wrist extension    Wrist ulnar deviation    Wrist radial deviation    Wrist pronation    Wrist supination    (Blank rows = not tested)  UPPER EXTREMITY MMT:  MMT Right eval Left eval  Shoulder flexion 2 2  Shoulder extension    Shoulder abduction 2 2  Shoulder adduction 2 2  Shoulder internal rotation 2 2  Shoulder external rotation 2 2  Middle trapezius    Lower trapezius    Elbow flexion    Elbow extension    Wrist flexion    Wrist extension    Wrist ulnar deviation    Wrist radial deviation    Wrist pronation    Wrist supination    Grip strength (lbs)    (Blank rows = not tested)  SHOULDER SPECIAL TESTS:    JOINT MOBILITY TESTING:  Capsular pattern Rt shoulder worse than left  PALPATION:     TODAY'S TREATMENT:   03/14/22 Performed 5-10 reps of HEP listed below Manual therapy for PROM bilateral shoulders to tolerance   PATIENT EDUCATION: Education details: HEP,PT plan of care Person educated: Patient Education method: Explanation, Demonstration, Verbal cues, and Handouts Education comprehension: verbalized understanding, returned demonstration, and needs further education   HOME EXERCISE PROGRAM: Access Code: [email or web address] URL: [email or web address] Date: 03/14/2022 Prepared by: Ivery QualeBrian  Ilya Ess  Exercises - Circular Shoulder Pendulum with Table Support  - 3 x daily - 7 x weekly - 1 sets - 20 reps - Seated Shoulder Abduction Towel Slide at Table Top  - 3 x daily - 6 x weekly - 1-2 sets - 10 reps - 5 hold - Seated Shoulder Flexion Towel Slide at Table Top  - 3 x  daily - 6 x weekly - 1-2 sets - 10 reps - Seated Shoulder External Rotation PROM on Table  - 3 x daily - 6 x weekly - 1-2 sets - 10 reps - 5 hold - Supine Shoulder Flexion AAROM with Hands Clasped  - 3 x daily - 6 x weekly - 1-2 sets - 10 reps  ASSESSMENT:  CLINICAL IMPRESSION: Patient presents to PT with chronic bilateral shoulder pain, stiffness consistent with froxen shoulders, and weakness. MD impression" rotator cuff arthropathy bilateral shoulders which appears chronic. he is probably more a candidate for reverse replacement as opposed to tendon transfer because of the arthritis in the shoulder and stiffness.  Before considering any intervention we would like to send him to therapy for 6 weeks to work on shoulder range of motion and also have him see his medical doctor to try to sort out the cause of his weight loss and generalized edema that he is having."Patient will benefit from skilled PT to address below impairments, limitations and improve overall function.  OBJECTIVE IMPAIRMENTS: decreased activity tolerance, decreased shoulder mobility, decreased ROM, decreased strength, impaired flexibility, impaired UE use, postural dysfunction, and pain.  ACTIVITY LIMITATIONS: reaching, lifting, carry,  cleaning, driving, and or occupation  PERSONAL FACTORS:  RTC tear and OA on MRI, Rt UE medial nerve surgery,NSTEMI, PVD and DVT after ankle Fx,PE,seizure are also affecting patient's functional outcome.  REHAB POTENTIAL: fair  CLINICAL DECISION MAKING: Stable/uncomplicated  EVALUATION COMPLEXITY: Low    GOALS: Short term PT Goals Target date: 04/11/2022 Pt will be I and compliant with HEP. Baseline:  Goal status:  New Pt will decrease pain by 25% overall Baseline: Goal status: New  Long term PT goals Target date: 04/25/2022 Pt will improve bilat shoulder AROM to WFL (90 deg flexion and abd, 45 deg IR/ER) to improve functional reaching Baseline: Goal status: New Pt will improve FOTO to at least 67% functional to show improved function Baseline: Goal status: New Pt will reduce pain to overall less than ]50% with usual activity and work activity. Baseline: Goal status: New  PLAN: PT FREQUENCY: 2-3 times per week   PT DURATION: 6 weeks  PLANNED INTERVENTIONS (unless contraindicated): aquatic PT, Canalith repositioning, cryotherapy, Electrical stimulation, Iontophoresis with 4 mg/ml dexamethasome, Moist heat, traction, Ultrasound, gait training, Therapeutic exercise, balance training, neuromuscular re-education, patient/family education, prosthetic training, manual techniques, passive ROM, dry needling, taping, vasopnuematic device, vestibular, spinal manipulations, joint manipulations  PLAN FOR NEXT SESSION: review HEP, MD recommendations "BIL SHOULDERS PT FOR PROM & AROM - 2-3X/WEEK FOR 6 WEEKS."    April Manson, PT,DPT 03/14/2022, 11:41 AM

## 2022-03-19 ENCOUNTER — Ambulatory Visit (INDEPENDENT_AMBULATORY_CARE_PROVIDER_SITE_OTHER): Payer: BC Managed Care – PPO | Admitting: Family Medicine

## 2022-03-19 ENCOUNTER — Encounter: Payer: Self-pay | Admitting: Family Medicine

## 2022-03-19 VITALS — BP 128/82 | HR 104 | Temp 99.8°F | Resp 16 | Ht 72.0 in | Wt 199.1 lb

## 2022-03-19 DIAGNOSIS — R21 Rash and other nonspecific skin eruption: Secondary | ICD-10-CM | POA: Diagnosis not present

## 2022-03-19 DIAGNOSIS — Z0000 Encounter for general adult medical examination without abnormal findings: Secondary | ICD-10-CM | POA: Diagnosis not present

## 2022-03-19 DIAGNOSIS — Z125 Encounter for screening for malignant neoplasm of prostate: Secondary | ICD-10-CM

## 2022-03-19 DIAGNOSIS — R809 Proteinuria, unspecified: Secondary | ICD-10-CM

## 2022-03-19 DIAGNOSIS — Z1211 Encounter for screening for malignant neoplasm of colon: Secondary | ICD-10-CM

## 2022-03-19 DIAGNOSIS — E663 Overweight: Secondary | ICD-10-CM

## 2022-03-19 MED ORDER — PERMETHRIN 5 % EX CREA: 1.0000 "application " | TOPICAL_CREAM | Freq: Once | CUTANEOUS | 0 refills | Status: AC

## 2022-03-19 MED ORDER — CLOTRIMAZOLE-BETAMETHASONE 1-0.05 % EX CREA: 1.0000 "application " | TOPICAL_CREAM | Freq: Two times a day (BID) | CUTANEOUS | 1 refills | Status: DC

## 2022-03-19 NOTE — Progress Notes (Signed)
   Subjective:    Patient ID: Cameron Kim, male    DOB: 1971-06-02, 51 y.o.   MRN: [id number]  HPI CPE- UTD on Tdap.  Due for colonoscopy  Health Maintenance  Topic Date Due   INFLUENZA VACCINE  05/08/2022   TETANUS/TDAP  12/03/2024   HPV VACCINES  Aged Out   COLONOSCOPY (Pts 45-8yrs Insurance coverage will need to be confirmed)  Discontinued   COVID-19 Vaccine  Discontinued   Hepatitis C Screening  Discontinued   HIV Screening  Discontinued   Zoster Vaccines- Shingrix  Discontinued    Review of Systems Patient reports no vision/hearing changes, anorexia, fever ,adenopathy, persistant/recurrent hoarseness, swallowing issues, chest pain, edema, persistant/recurrent cough, hemoptysis, dyspnea (rest,exertional, paroxysmal nocturnal), gastrointestinal  bleeding (melena, rectal bleeding), abdominal pain, excessive heart burn, GU symptoms (dysuria, hematuria, voiding/incontinence issues) syncope, memory loss, hair/nail changes, depression, anxiety, abnormal bruising/bleeding.   + rash- no improvement w/ Prednisone or Triamcinolone ointment.  Pt reports rash started when pt was incarcerated.  They started pt on Metoprolol around the same time.  He had stopped the Metoprolol for ~1 month but rash didn't improve.  + palpitations- following w/ Cardiology    Objective:   Physical Exam General Appearance:    Alert, cooperative, no distress, appears older than stated age  Head:    Normocephalic, without obvious abnormality, atraumatic  Eyes:    PERRL, conjunctiva/corneas clear, EOM's intact both eyes       Ears:    Normal TM's and external ear canals, both ears  Nose:   Nares normal, septum midline, mucosa normal, no drainage   or sinus tenderness  Throat:   Lips, mucosa, and tongue normal; teeth and gums normal  Neck:   Supple, symmetrical, trachea midline, no adenopathy;       thyroid:  No enlargement/tenderness/nodules  Back:     Symmetric, no curvature, ROM normal, no CVA tenderness   Lungs:     Clear to auscultation bilaterally, respirations unlabored  Chest wall:    No tenderness or deformity  Heart:    Regular rate and rhythm, S1 and S2 normal, no murmur, rub   or gallop  Abdomen:     Soft, non-tender, bowel sounds active all four quadrants,    no masses, no organomegaly  Genitalia:    deferred  Rectal:    Extremities:   Extremities normal, atraumatic, no cyanosis or edema  Pulses:   2+ and symmetric all extremities  Skin:   Skin color, texture, turgor normal, ongoing rash diffusely distributed on trunk and extremities  Lymph nodes:   Cervical, supraclavicular, and axillary nodes normal  Neurologic:   CNII-XII intact.  AAOx3.  Decreased muscle strength throughout          Assessment & Plan:  Rash- no improvement w/ prednisone or triamcinolone.  Given that he was incarcerated, will start Elimite for possible scabies and refer to derm.  Pt expressed understanding and is in agreement w/ plan.   Proteinuria- refer to Nephrology

## 2022-03-19 NOTE — Patient Instructions (Addendum)
Follow up in 3 months to see how things are going We'll notify you of your lab results and make any changes if needed USE the Permethrin cream tonight- apply from neck down, sleep in the cream overnight, strip your bed in the morning, shower, use a clean towel.  Wash all of your clothes We'll call you with your Dermatology, Nephrology, and GI appts USE the Lotrisone cream twice daily on the ringworm areas Call with any questions or concerns Hang in there!!!  You're doing great!!

## 2022-03-20 ENCOUNTER — Telehealth: Payer: Self-pay | Admitting: Family Medicine

## 2022-03-20 ENCOUNTER — Other Ambulatory Visit (INDEPENDENT_AMBULATORY_CARE_PROVIDER_SITE_OTHER): Payer: BC Managed Care – PPO

## 2022-03-20 DIAGNOSIS — E663 Overweight: Secondary | ICD-10-CM | POA: Diagnosis not present

## 2022-03-20 DIAGNOSIS — Z125 Encounter for screening for malignant neoplasm of prostate: Secondary | ICD-10-CM | POA: Diagnosis not present

## 2022-03-20 LAB — CBC WITH DIFFERENTIAL/PLATELET
Basophils Absolute: 0.1 10*3/uL (ref 0.0–0.1)
Basophils Relative: 0.5 % (ref 0.0–3.0)
Eosinophils Absolute: 0.3 10*3/uL (ref 0.0–0.7)
Eosinophils Relative: 2.3 % (ref 0.0–5.0)
HCT: 39.9 % (ref 39.0–52.0)
Hemoglobin: 12.7 g/dL — ABNORMAL LOW (ref 13.0–17.0)
Lymphocytes Relative: 14.9 % (ref 12.0–46.0)
Lymphs Abs: 2.2 10*3/uL (ref 0.7–4.0)
MCHC: 31.8 g/dL (ref 30.0–36.0)
MCV: 88.2 fl (ref 78.0–100.0)
Monocytes Absolute: 1 10*3/uL (ref 0.1–1.0)
Monocytes Relative: 6.6 % (ref 3.0–12.0)
Neutro Abs: 11.3 10*3/uL — ABNORMAL HIGH (ref 1.4–7.7)
Neutrophils Relative %: 75.7 % (ref 43.0–77.0)
Platelets: 434 10*3/uL — ABNORMAL HIGH (ref 150.0–400.0)
RBC: 4.53 Mil/uL (ref 4.22–5.81)
RDW: 16.4 % — ABNORMAL HIGH (ref 11.5–15.5)
WBC: 14.9 10*3/uL — ABNORMAL HIGH (ref 4.0–10.5)

## 2022-03-20 LAB — BASIC METABOLIC PANEL
BUN: 18 mg/dL (ref 6–23)
CO2: 30 mEq/L (ref 19–32)
Calcium: 9.2 mg/dL (ref 8.4–10.5)
Chloride: 98 mEq/L (ref 96–112)
Creatinine, Ser: 0.7 mg/dL (ref 0.40–1.50)
GFR: 106.77 mL/min (ref >60.00)
Glucose, Bld: 80 mg/dL (ref 70–99)
Potassium: 4.4 mEq/L (ref 3.5–5.1)
Sodium: 136 mEq/L (ref 135–145)

## 2022-03-20 LAB — HEPATIC FUNCTION PANEL
ALT: 23 U/L (ref 0–53)
AST: 93 U/L — ABNORMAL HIGH (ref 0–37)
Albumin: 3.1 g/dL — ABNORMAL LOW (ref 3.5–5.2)
Alkaline Phosphatase: 63 U/L (ref 39–117)
Bilirubin, Direct: 0.2 mg/dL (ref 0.0–0.3)
Total Bilirubin: 0.8 mg/dL (ref 0.2–1.2)
Total Protein: 7.7 g/dL (ref 6.0–8.3)

## 2022-03-20 LAB — LIPID PANEL
Cholesterol: 151 mg/dL (ref 0–200)
HDL: 38.3 mg/dL — ABNORMAL LOW (ref >39.00)
LDL Cholesterol: 82 mg/dL (ref 0–99)
NonHDL: 112.81
Total CHOL/HDL Ratio: 4
Triglycerides: 154 mg/dL — ABNORMAL HIGH (ref 0.0–149.0)
VLDL: 30.8 mg/dL (ref 0.0–40.0)

## 2022-03-20 LAB — PSA: PSA: 0.18 ng/mL (ref 0.10–4.00)

## 2022-03-20 LAB — TSH: TSH: 4.28 u[IU]/mL (ref 0.35–5.50)

## 2022-03-26 ENCOUNTER — Encounter: Payer: BC Managed Care – PPO | Admitting: Physical Therapy

## 2022-03-27 ENCOUNTER — Telehealth: Payer: Self-pay | Admitting: Family Medicine

## 2022-03-27 MED ORDER — RIVAROXABAN 20 MG PO TABS: 20.0000 mg | ORAL_TABLET | Freq: Every day | ORAL | 1 refills | Status: DC

## 2022-03-27 NOTE — Telephone Encounter (Signed)
It appears that the 15mg  refill was sent in error.  New prescription for 20mg  daily [facility]

## 2022-03-27 NOTE — Telephone Encounter (Signed)
[facility] called to check on the dose of Xarelto. Stated normally it is 15mg  two times a day for three weeks after a DVT and 20 mg maintenance dose daily thereafter. Needed confirmation.   [phone]   Ref # [id number]

## 2022-03-27 NOTE — Telephone Encounter (Signed)
Called back [facility] and let them know this was sent and they also cleared the 15 mg rx from the system

## 2022-03-28 ENCOUNTER — Encounter: Payer: Self-pay | Admitting: Physical Therapy

## 2022-03-28 ENCOUNTER — Ambulatory Visit (INDEPENDENT_AMBULATORY_CARE_PROVIDER_SITE_OTHER): Payer: BC Managed Care – PPO | Admitting: Physical Therapy

## 2022-03-28 DIAGNOSIS — M25511 Pain in right shoulder: Secondary | ICD-10-CM | POA: Diagnosis not present

## 2022-03-28 DIAGNOSIS — M25612 Stiffness of left shoulder, not elsewhere classified: Secondary | ICD-10-CM | POA: Diagnosis not present

## 2022-03-28 DIAGNOSIS — G8929 Other chronic pain: Secondary | ICD-10-CM

## 2022-03-28 DIAGNOSIS — M25512 Pain in left shoulder: Secondary | ICD-10-CM | POA: Diagnosis not present

## 2022-03-28 DIAGNOSIS — M25611 Stiffness of right shoulder, not elsewhere classified: Secondary | ICD-10-CM

## 2022-03-28 DIAGNOSIS — M6281 Muscle weakness (generalized): Secondary | ICD-10-CM

## 2022-03-28 NOTE — Therapy (Signed)
OUTPATIENT PHYSICAL THERAPY TREATMENT NOTE   Patient Name: Cameron Kim MRN: [id number] DOB:[birth year], 51 y.o., male Today's Date: 03/28/2022   END OF SESSION:   PT End of Session - 03/28/22 1153     Visit Number 2    Number of Visits 12    Date for PT Re-Evaluation 04/25/22    PT Start Time 1145    PT Stop Time 1225    PT Time Calculation (min) 40 min    Activity Tolerance Patient tolerated treatment well    Behavior During Therapy Cameron Kim for tasks assessed/performed             Past Medical History:  Diagnosis Date   Abnormal EKG    Ankle fracture, left    Peripheral vascular disease (HCC) 2013   dvt-pe after ankle fx   Pulmonary embolism (HCC) 05/2012   Seizure disorder (HCC) 11/07/2020   Past Surgical History:  Procedure Laterality Date   I & D EXTREMITY Right 06/07/2016   Procedure: IRRIGATION AND DEBRIDEMENT RIGHT WRIST,MEDIAN NERVE EXPLORATION NEUROLYSIS AND REPAIR;  Surgeon: Cameron Severin, MD;  Location: [facility] OR;  Service: Orthopedics;  Laterality: Right;   medial nerve surgry right arm     NO PAST SURGERIES     Patient Active Problem List   Diagnosis Date Noted   Pericardial effusion 02/28/2022   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   Unintentional weight loss 02/11/2022   Lower extremity edema 02/11/2022   Dysarthria 02/11/2022   Weakness generalized 02/11/2022   Chronic pain of both shoulders 02/11/2022   Dry skin 02/11/2022   History of non-ST elevation myocardial infarction (NSTEMI) 02/11/2022   Acute deep vein thrombosis (DVT) of lower extremity (HCC) 05/02/2021   Seizure disorder (HCC) 11/07/2020   Epistaxis 06/15/2020   Erectile dysfunction 10/31/2017   Adjustment disorder with depressed mood 03/25/2017   Glucose intolerance 02/25/2017   Physical exam 12/03/2014   Right bundle branch block and left anterior fascicular block 01/18/2014   Hx of pulmonary embolus 01/13/2014   Soft tissue mass 01/13/2014   Overweight (BMI 25.0-29.9) 01/13/2014   Allergic  rhinitis 01/13/2014      THERAPY DIAG:  Chronic right shoulder pain  Chronic left shoulder pain  Stiffness of right shoulder, not elsewhere classified  Stiffness of left shoulder, not elsewhere classified  Muscle weakness (generalized)  PCP: Cameron Hatch, MD   REFERRING PROVIDER: Cammy Copa, MD   REFERRING DIAG: M25.511,G89.29,M25.512 (ICD-10-CM) - Chronic pain of both shoulders    Rationale for Evaluation and Treatment Rehabilitation   ONSET DATE: chronic pain and stiffness   SUBJECTIVE:  SUBJECTIVE STATEMENT:He says jhe had to miss last appointment due to conflict and had to meet with parole officer.     PERTINENT HISTORY: OA and Rt RTC tear noted in MRI, likely will need reverse TSA per MD   PAIN:  Are you having pain? Yes: NPRS scale: 6/10 Pain location: "deep in the ball" Pain description: ache Aggravating factors: laying on Rt side, raising or reaching arms Relieving factors: none   PRECAUTIONS: ROM vs strength per MD recommendations   WEIGHT BEARING RESTRICTIONS No   FALLS:  Has patient fallen in last 6 months? Yes. Number of falls 1, due to bee coming at him   OCCUPATION: None at the moment   PLOF: Independent   PATIENT GOALS improve motion and pain in shoulders   OBJECTIVE:    DIAGNOSTIC FINDINGS:  Rt shoulder MRI 1. Massive full-thickness tear of the entire supraspinatus tendon and likely the entire infraspinatus tendon footprint. Moderate to severe supraspinatus and moderate infraspinatus muscle atrophy. 2. Mild superior greater than inferior subscapularis partial-thickness tearing. 3. Mild-to-moderate attenuation/partial-thickness tearing of the proximal long head of the biceps tendon. 4. Mild-to-moderate degenerative changes of the  acromioclavicular joint. Type 3 acromion. Mild-to-moderate glenohumeral cartilage degenerative changes.   PATIENT SURVEYS:  FOTO 44% functional intake at eval   COGNITION:           Overall cognitive status: Within functional limits for tasks assessed                                  SENSATION: WFL   POSTURE: Slumped posture   UPPER EXTREMITY ROM:    AROM in standing/PROM in supine Right eval Left eval  Shoulder flexion 20/90 50/95  Shoulder extension      Shoulder abduction 40/70 60/75  Shoulder adduction      Shoulder internal rotation 20/30 30/40  Shoulder external rotation 10/38 20/35  Elbow flexion      Elbow extension      Wrist flexion      Wrist extension      Wrist ulnar deviation      Wrist radial deviation      Wrist pronation      Wrist supination      (Blank rows = not tested)   UPPER EXTREMITY MMT:   MMT Right eval Left eval  Shoulder flexion 2 2  Shoulder extension      Shoulder abduction 2 2  Shoulder adduction 2 2  Shoulder internal rotation 2 2  Shoulder external rotation 2 2  Middle trapezius      Lower trapezius      Elbow flexion      Elbow extension      Wrist flexion      Wrist extension      Wrist ulnar deviation      Wrist radial deviation      Wrist pronation      Wrist supination      Grip strength (lbs)      (Blank rows = not tested)   SHOULDER SPECIAL TESTS:               JOINT MOBILITY TESTING:  Capsular pattern Rt shoulder worse than left   PALPATION:                TODAY'S TREATMENT:  03/28/22 Pulleys 3 min flexion, 3 min abduction Tableslides AAROM stretching shoulder flexion, abd, ER 5 sec X 10  each bilat Supine AAROM with dow shoulder flexion, abd, ER 5 sec  X 5 reps each bilat PROM Rt shoulder into flexion 20 sec hold X 5       PATIENT EDUCATION: Education details: HEP,PT plan of care Person educated: Patient Education method: Explanation, Demonstration, Verbal cues, and Handouts Education  comprehension: verbalized understanding, returned demonstration, and needs further education     HOME EXERCISE PROGRAM: Access Code: [email or web address] URL: [email or web address] Date: 03/14/2022 Prepared by: Cameron Kim   Exercises - Circular Shoulder Pendulum with Table Support  - 3 x daily - 7 x weekly - 1 sets - 20 reps - Seated Shoulder Abduction Towel Slide at Table Top  - 3 x daily - 6 x weekly - 1-2 sets - 10 reps - 5 hold - Seated Shoulder Flexion Towel Slide at Table Top  - 3 x daily - 6 x weekly - 1-2 sets - 10 reps - Seated Shoulder External Rotation PROM on Table  - 3 x daily - 6 x weekly - 1-2 sets - 10 reps - 5 hold - Supine Shoulder Flexion AAROM with Hands Clasped  - 3 x daily - 6 x weekly - 1-2 sets - 10 reps   ASSESSMENT:   CLINICAL IMPRESSION: I provided him with a set of pulleys today that someone had donated so he can further work to improve his shoulder ROM at home. ROM continues to be limited and painful. I advised him keep stretches into a ROM that is challenging but still tolerated.    OBJECTIVE IMPAIRMENTS: decreased activity tolerance, decreased shoulder mobility, decreased ROM, decreased strength, impaired flexibility, impaired UE use, postural dysfunction, and pain.   ACTIVITY LIMITATIONS: reaching, lifting, carry,  cleaning, driving, and or occupation   PERSONAL FACTORS:  RTC tear and OA on MRI, Rt UE medial nerve surgery,NSTEMI, PVD and DVT after ankle Fx,PE,seizure are also affecting patient's functional outcome.   REHAB POTENTIAL: fair   CLINICAL DECISION MAKING: Stable/uncomplicated   EVALUATION COMPLEXITY: Low       GOALS: Short term PT Goals Target date: 04/11/2022 Pt will be I and compliant with HEP. Baseline:  Goal status: New Pt will decrease pain by 25% overall Baseline: Goal status: New   Long term PT goals Target date: 04/25/2022 Pt will improve bilat shoulder AROM to WFL (90 deg flexion and abd, 45 deg IR/ER) to improve  functional reaching Baseline: Goal status: New Pt will improve FOTO to at least 67% functional to show improved function Baseline: Goal status: New Pt will reduce pain to overall less than ]50% with usual activity and work activity. Baseline: Goal status: New   PLAN: PT FREQUENCY: 2-3 times per week    PT DURATION: 6 weeks   PLANNED INTERVENTIONS (unless contraindicated): aquatic PT, Canalith repositioning, cryotherapy, Electrical stimulation, Iontophoresis with 4 mg/ml dexamethasome, Moist heat, traction, Ultrasound, gait training, Therapeutic exercise, balance training, neuromuscular re-education, patient/family education, prosthetic training, manual techniques, passive ROM, dry needling, taping, vasopnuematic device, vestibular, spinal manipulations, joint manipulations   PLAN FOR NEXT SESSION: review HEP, MD recommendations "BIL SHOULDERS PT FOR PROM & AROM - 2-3X/WEEK FOR 6 WEEKS."   April Manson, PT,DPT 03/28/2022, 11:56 AM

## 2022-04-02 ENCOUNTER — Ambulatory Visit (INDEPENDENT_AMBULATORY_CARE_PROVIDER_SITE_OTHER): Payer: BC Managed Care – PPO | Admitting: Physical Therapy

## 2022-04-02 ENCOUNTER — Encounter: Payer: Self-pay | Admitting: Physical Therapy

## 2022-04-02 DIAGNOSIS — M25612 Stiffness of left shoulder, not elsewhere classified: Secondary | ICD-10-CM

## 2022-04-02 DIAGNOSIS — M6281 Muscle weakness (generalized): Secondary | ICD-10-CM

## 2022-04-02 DIAGNOSIS — M25611 Stiffness of right shoulder, not elsewhere classified: Secondary | ICD-10-CM

## 2022-04-02 DIAGNOSIS — G8929 Other chronic pain: Secondary | ICD-10-CM

## 2022-04-02 DIAGNOSIS — R600 Localized edema: Secondary | ICD-10-CM

## 2022-04-02 DIAGNOSIS — M25512 Pain in left shoulder: Secondary | ICD-10-CM | POA: Diagnosis not present

## 2022-04-02 DIAGNOSIS — M25511 Pain in right shoulder: Secondary | ICD-10-CM | POA: Diagnosis not present

## 2022-04-04 ENCOUNTER — Encounter: Payer: Self-pay | Admitting: Physical Therapy

## 2022-04-04 ENCOUNTER — Ambulatory Visit (INDEPENDENT_AMBULATORY_CARE_PROVIDER_SITE_OTHER): Payer: BC Managed Care – PPO | Admitting: Physical Therapy

## 2022-04-04 DIAGNOSIS — M25611 Stiffness of right shoulder, not elsewhere classified: Secondary | ICD-10-CM

## 2022-04-04 DIAGNOSIS — M25511 Pain in right shoulder: Secondary | ICD-10-CM | POA: Diagnosis not present

## 2022-04-04 DIAGNOSIS — G8929 Other chronic pain: Secondary | ICD-10-CM

## 2022-04-04 DIAGNOSIS — M25612 Stiffness of left shoulder, not elsewhere classified: Secondary | ICD-10-CM

## 2022-04-04 DIAGNOSIS — M6281 Muscle weakness (generalized): Secondary | ICD-10-CM

## 2022-04-04 DIAGNOSIS — M25512 Pain in left shoulder: Secondary | ICD-10-CM | POA: Diagnosis not present

## 2022-04-04 DIAGNOSIS — R600 Localized edema: Secondary | ICD-10-CM

## 2022-04-04 NOTE — Therapy (Signed)
OUTPATIENT PHYSICAL THERAPY TREATMENT NOTE   Patient Name: Cameron Kim MRN: [id number] DOB:10-12-1970, 51 y.o., male Today's Date: 04/04/2022   END OF SESSION:   PT End of Session - 04/04/22 1127     Visit Number 4    Number of Visits 12    Date for PT Re-Evaluation 04/25/22    PT Start Time 1138    PT Stop Time 1217    PT Time Calculation (min) 39 min    Activity Tolerance Patient tolerated treatment well    Behavior During Therapy [facility] for tasks assessed/performed             Past Medical History:  Diagnosis Date   Abnormal EKG    Ankle fracture, left    Peripheral vascular disease (HCC) 2013   dvt-pe after ankle fx   Pulmonary embolism (HCC) 05/2012   Seizure disorder (HCC) 11/07/2020   Past Surgical History:  Procedure Laterality Date   I & D EXTREMITY Right 06/07/2016   Procedure: IRRIGATION AND DEBRIDEMENT RIGHT WRIST,MEDIAN NERVE EXPLORATION NEUROLYSIS AND REPAIR;  Surgeon: Dominica Severin, MD;  Location: [facility] OR;  Service: Orthopedics;  Laterality: Right;   medial nerve surgry right arm     NO PAST SURGERIES     Patient Active Problem List   Diagnosis Date Noted   Pericardial effusion 02/28/2022   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   Unintentional weight loss 02/11/2022   Lower extremity edema 02/11/2022   Dysarthria 02/11/2022   Weakness generalized 02/11/2022   Chronic pain of both shoulders 02/11/2022   Dry skin 02/11/2022   History of non-ST elevation myocardial infarction (NSTEMI) 02/11/2022   Acute deep vein thrombosis (DVT) of lower extremity (HCC) 05/02/2021   Seizure disorder (HCC) 11/07/2020   Epistaxis 06/15/2020   Erectile dysfunction 10/31/2017   Adjustment disorder with depressed mood 03/25/2017   Glucose intolerance 02/25/2017   Physical exam 12/03/2014   Right bundle branch block and left anterior fascicular block 01/18/2014   Hx of pulmonary embolus 01/13/2014   Soft tissue mass 01/13/2014   Overweight (BMI 25.0-29.9) 01/13/2014   Allergic  rhinitis 01/13/2014      THERAPY DIAG:  Chronic right shoulder pain  Chronic left shoulder pain  Stiffness of right shoulder, not elsewhere classified  Stiffness of left shoulder, not elsewhere classified  Muscle weakness (generalized)  Localized edema  PCP: Sheliah Hatch, MD   REFERRING PROVIDER: Cammy Copa, MD   REFERRING DIAG: M25.511,G89.29,M25.512 (ICD-10-CM) - Chronic pain of both shoulders    Rationale for Evaluation and Treatment Rehabilitation   ONSET DATE: chronic pain and stiffness   SUBJECTIVE:  SUBJECTIVE STATEMENT:He says he has not had a chance to use his home pulleys that much due to a busy weekend. He does say he has been doing his HEP which helps at the time but he feels his shoulders go right back to locking up on him later. He also complains of sciatica today going down both sides of his legs.     PERTINENT HISTORY: OA and Rt RTC tear noted in MRI, likely will need reverse TSA per MD   PAIN:  Are you having pain? Yes: NPRS scale: 6/10 Pain location: "deep in the ball" Pain description: ache Aggravating factors: laying on Rt side, raising or reaching arms Relieving factors: none   PRECAUTIONS: ROM vs strength per MD recommendations   WEIGHT BEARING RESTRICTIONS No   FALLS:  Has patient fallen in last 6 months? Yes. Number of falls 1, due to bee coming at him   OCCUPATION: None at the moment   PLOF: Independent   PATIENT GOALS improve motion and pain in shoulders   OBJECTIVE:    DIAGNOSTIC FINDINGS:  Rt shoulder MRI 1. Massive full-thickness tear of the entire supraspinatus tendon and likely the entire infraspinatus tendon footprint. Moderate to severe supraspinatus and moderate infraspinatus muscle atrophy. 2. Mild superior greater than  inferior subscapularis partial-thickness tearing. 3. Mild-to-moderate attenuation/partial-thickness tearing of the proximal long head of the biceps tendon. 4. Mild-to-moderate degenerative changes of the acromioclavicular joint. Type 3 acromion. Mild-to-moderate glenohumeral cartilage degenerative changes.   PATIENT SURVEYS:  FOTO 44% functional intake at eval   COGNITION:           Overall cognitive status: Within functional limits for tasks assessed                                  SENSATION: WFL   POSTURE: Slumped posture   UPPER EXTREMITY ROM:    AROM in standing/PROM in supine Right eval Left eval Right 04/04/22 Left 04/04/22  Shoulder flexion 20/90 50/95 /95 /100  Shoulder extension        Shoulder abduction 40/70 60/75 /80   Shoulder adduction        Shoulder internal rotation 20/30 30/40    Shoulder external rotation 10/38 20/35    Elbow flexion        Elbow extension        Wrist flexion        Wrist extension        Wrist ulnar deviation        Wrist radial deviation        Wrist pronation        Wrist supination        (Blank rows = not tested)   UPPER EXTREMITY MMT:   MMT Right eval Left eval  Shoulder flexion 2 2  Shoulder extension      Shoulder abduction 2 2  Shoulder adduction 2 2  Shoulder internal rotation 2 2  Shoulder external rotation 2 2  Middle trapezius      Lower trapezius      Elbow flexion      Elbow extension      Wrist flexion      Wrist extension      Wrist ulnar deviation      Wrist radial deviation      Wrist pronation      Wrist supination      Grip strength (lbs)      (  Blank rows = not tested)   SHOULDER SPECIAL TESTS:               JOINT MOBILITY TESTING:  Capsular pattern Rt shoulder worse than left   PALPATION:                TODAY'S TREATMENT:  04/04/22 UBE L3 2.5 min fwd, 2.5 min retro Pulleys 2.5 min flexion, 2.5 min abduction Standing pendulums circles CW,CCW X 20 each bilat Supine AAROM with dow  shoulder flexion, abd, ER 5 sec  X 10 reps each bilat PROM Rt shoulder all planes to tolerance, GH mobs Rt shoulder inferior glide and A-P glides, PROM Lt shoulder flexion  04/02/22 Pulleys 3 min flexion, 3 min abduction Standing pendulums circles CW,CCW X 20 each bilat Tableslides standing using hand on top of red ball AAROM stretching shoulder flexion, abd 5 sec X 10 each bilat Supine AAROM with dow shoulder flexion, abd, ER 5 sec  X 10 reps each bilat PROM Rt shoulder into flexion 20 sec hold X 5  03/28/22 Pulleys 3 min flexion, 3 min abduction Tableslides AAROM stretching shoulder flexion, abd, ER 5 sec X 10 each bilat Supine AAROM with dow shoulder flexion, abd, ER 5 sec  X 5 reps each bilat PROM Rt shoulder into flexion 20 sec hold X 5       PATIENT EDUCATION: Education details: HEP,PT plan of care Person educated: Patient Education method: Explanation, Demonstration, Verbal cues, and Handouts Education comprehension: verbalized understanding, returned demonstration, and needs further education     HOME EXERCISE PROGRAM: Access Code: [email or web address] URL: [email or web address] Date: 03/14/2022 Prepared by: Ivery Quale   Exercises - Circular Shoulder Pendulum with Table Support  - 3 x daily - 7 x weekly - 1 sets - 20 reps - Seated Shoulder Abduction Towel Slide at Table Top  - 3 x daily - 6 x weekly - 1-2 sets - 10 reps - 5 hold - Seated Shoulder Flexion Towel Slide at Table Top  - 3 x daily - 6 x weekly - 1-2 sets - 10 reps - Seated Shoulder External Rotation PROM on Table  - 3 x daily - 6 x weekly - 1-2 sets - 10 reps - 5 hold - Supine Shoulder Flexion AAROM with Hands Clasped  - 3 x daily - 6 x weekly - 1-2 sets - 10 reps   ASSESSMENT:   CLINICAL IMPRESSION: We was able to progress to UBE today for ROM and strengthening of bilat shoulders. He did show improved overall PROM noted today but still with limitations that we will continue to work on in PT.  OBJECTIVE  IMPAIRMENTS: decreased activity tolerance, decreased shoulder mobility, decreased ROM, decreased strength, impaired flexibility, impaired UE use, postural dysfunction, and pain.   ACTIVITY LIMITATIONS: reaching, lifting, carry,  cleaning, driving, and or occupation   PERSONAL FACTORS:  RTC tear and OA on MRI, Rt UE medial nerve surgery,NSTEMI, PVD and DVT after ankle Fx,PE,seizure are also affecting patient's functional outcome.   REHAB POTENTIAL: fair   CLINICAL DECISION MAKING: Stable/uncomplicated   EVALUATION COMPLEXITY: Low       GOALS: Short term PT Goals Target date: 04/11/2022 Pt will be I and compliant with HEP. Baseline:  Goal status: New Pt will decrease pain by 25% overall Baseline: Goal status: New   Long term PT goals Target date: 04/25/2022 Pt will improve bilat shoulder AROM to WFL (90 deg flexion and abd, 45 deg IR/ER) to improve functional reaching Baseline: Goal status: New  Pt will improve FOTO to at least 67% functional to show improved function Baseline: Goal status: New Pt will reduce pain to overall less than ]50% with usual activity and work activity. Baseline: Goal status: New   PLAN: PT FREQUENCY: 2-3 times per week    PT DURATION: 6 weeks   PLANNED INTERVENTIONS (unless contraindicated): aquatic PT, Canalith repositioning, cryotherapy, Electrical stimulation, Iontophoresis with 4 mg/ml dexamethasome, Moist heat, traction, Ultrasound, gait training, Therapeutic exercise, balance training, neuromuscular re-education, patient/family education, prosthetic training, manual techniques, passive ROM, dry needling, taping, vasopnuematic device, vestibular, spinal manipulations, joint manipulations   PLAN FOR NEXT SESSION: ROM and GH mobility focus. MD recommendations "BIL SHOULDERS PT FOR PROM & AROM - 2-3X/WEEK FOR 6 WEEKS."   April Manson, PT,DPT 04/04/2022, 12:20 PM

## 2022-04-09 ENCOUNTER — Encounter: Payer: BC Managed Care – PPO | Admitting: Physical Therapy

## 2022-04-11 ENCOUNTER — Encounter: Payer: Self-pay | Admitting: Physical Therapy

## 2022-04-11 ENCOUNTER — Ambulatory Visit (INDEPENDENT_AMBULATORY_CARE_PROVIDER_SITE_OTHER): Payer: BC Managed Care – PPO | Admitting: Physical Therapy

## 2022-04-11 DIAGNOSIS — G8929 Other chronic pain: Secondary | ICD-10-CM

## 2022-04-11 DIAGNOSIS — M25612 Stiffness of left shoulder, not elsewhere classified: Secondary | ICD-10-CM

## 2022-04-11 DIAGNOSIS — M25512 Pain in left shoulder: Secondary | ICD-10-CM | POA: Diagnosis not present

## 2022-04-11 DIAGNOSIS — M25611 Stiffness of right shoulder, not elsewhere classified: Secondary | ICD-10-CM

## 2022-04-11 DIAGNOSIS — M25511 Pain in right shoulder: Secondary | ICD-10-CM

## 2022-04-11 DIAGNOSIS — M6281 Muscle weakness (generalized): Secondary | ICD-10-CM

## 2022-04-11 DIAGNOSIS — R600 Localized edema: Secondary | ICD-10-CM

## 2022-04-11 NOTE — Therapy (Addendum)
OUTPATIENT PHYSICAL THERAPY TREATMENT NOTE PHYSICAL THERAPY DISCHARGE SUMMARY  Visits from Start of Care: 5  Current functional level related to goals / functional outcomes: See below   Remaining deficits: See below   Education / Equipment: HEP  Plan:  Patient goals were not met. Patient is being discharged due to not returning since last visit.      Patient Name: Cameron Kim MRN: [id number] DOB:[birth year], 51 y.o., male Today's Date: 04/11/2022   END OF SESSION:   PT End of Session - 04/11/22 1141     Visit Number 5    Number of Visits 12    Date for PT Re-Evaluation 04/25/22    PT Start Time 5852    PT Stop Time 1220    PT Time Calculation (min) 38 min    Activity Tolerance Patient tolerated treatment well    Behavior During Therapy [facility] for tasks assessed/performed             Past Medical History:  Diagnosis Date   Abnormal EKG    Ankle fracture, left    Peripheral vascular disease ([place]) 2013   dvt-pe after ankle fx   Pulmonary embolism ([place]) 05/2012   Seizure disorder ([place]) 11/07/2020   Past Surgical History:  Procedure Laterality Date   I & D EXTREMITY Right 06/07/2016   Procedure: IRRIGATION AND DEBRIDEMENT RIGHT WRIST,MEDIAN NERVE EXPLORATION NEUROLYSIS AND REPAIR;  Surgeon: Roseanne Kaufman, MD;  Location: [place];  Service: Orthopedics;  Laterality: Right;   medial nerve surgry right arm     NO PAST SURGERIES     Patient Active Problem List   Diagnosis Date Noted   Pericardial effusion 02/28/2022   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   Unintentional weight loss 02/11/2022   Lower extremity edema 02/11/2022   Dysarthria 02/11/2022   Weakness generalized 02/11/2022   Chronic pain of both shoulders 02/11/2022   Dry skin 02/11/2022   History of non-ST elevation myocardial infarction (NSTEMI) 02/11/2022   Acute deep vein thrombosis (DVT) of lower extremity ([place]) 05/02/2021   Seizure disorder ([place]) 11/07/2020   Epistaxis 06/15/2020   Erectile dysfunction  10/31/2017   Adjustment disorder with depressed mood 03/25/2017   Glucose intolerance 02/25/2017   Physical exam 12/03/2014   Right bundle branch block and left anterior fascicular block 01/18/2014   Hx of pulmonary embolus 01/13/2014   Soft tissue mass 01/13/2014   Overweight (BMI 25.0-29.9) 01/13/2014   Allergic rhinitis 01/13/2014      THERAPY DIAG:  Chronic right shoulder pain  Chronic left shoulder pain  Stiffness of right shoulder, not elsewhere classified  Stiffness of left shoulder, not elsewhere classified  Muscle weakness (generalized)  Localized edema  PCP: Midge Minium, MD   REFERRING PROVIDER: Meredith Pel, MD   REFERRING DIAG: M25.511,G89.29,M25.512 (ICD-10-CM) - Chronic pain of both shoulders    Rationale for Evaluation and Treatment Rehabilitation   ONSET DATE: chronic pain and stiffness   SUBJECTIVE:  SUBJECTIVE STATEMENT:He says he tried playing his base guitar some on Monday and feels like he over played this because his arms are painful and very sore since then.      PERTINENT HISTORY: OA and Rt RTC tear noted in MRI, likely will need reverse TSA per MD   PAIN:  Are you having pain? Yes: NPRS scale: 6/10 Pain location: "deep in the ball" Pain description: ache Aggravating factors: laying on Rt side, raising or reaching arms Relieving factors: none   PRECAUTIONS: ROM vs strength per MD recommendations   WEIGHT BEARING RESTRICTIONS No   FALLS:  Has patient fallen in last 6 months? Yes. Number of falls 1, due to bee coming at him   OCCUPATION: None at the moment   PLOF: Independent   PATIENT GOALS improve motion and pain in shoulders   OBJECTIVE:    DIAGNOSTIC FINDINGS:  Rt shoulder MRI 1. Massive full-thickness tear of the entire  supraspinatus tendon and likely the entire infraspinatus tendon footprint. Moderate to severe supraspinatus and moderate infraspinatus muscle atrophy. 2. Mild superior greater than inferior subscapularis partial-thickness tearing. 3. Mild-to-moderate attenuation/partial-thickness tearing of the proximal long head of the biceps tendon. 4. Mild-to-moderate degenerative changes of the acromioclavicular joint. Type 3 acromion. Mild-to-moderate glenohumeral cartilage degenerative changes.   PATIENT SURVEYS:  FOTO 44% functional intake at eval   COGNITION:           Overall cognitive status: Within functional limits for tasks assessed                                  SENSATION: WFL   POSTURE: Slumped posture   UPPER EXTREMITY ROM:    AROM in standing/PROM in supine Right eval Left eval Right 04/04/22 Left 04/04/22 Right 04/11/22 Left 04/11/22  Shoulder flexion 20/90 50/95 /95 /100 30/92 70/100  Shoulder extension          Shoulder abduction 40/70 60/75 /80  30/90 65/95  Shoulder adduction          Shoulder internal rotation 20/30 30/40   30/45 30/50  Shoulder external rotation 10/38 20/35   30/40 45/55  Elbow flexion          Elbow extension          Wrist flexion          Wrist extension          Wrist ulnar deviation          Wrist radial deviation          Wrist pronation          Wrist supination          (Blank rows = not tested)   UPPER EXTREMITY MMT:   MMT Right eval Left eval Right 04/11/22 Left 04/11/22  Shoulder flexion 2 2 2 2   Shoulder extension        Shoulder abduction 2 2 2 2   Shoulder adduction 2 2 2 2   Shoulder internal rotation 2 2 3 3   Shoulder external rotation 2 2 2 2   Middle trapezius        Lower trapezius        Elbow flexion        Elbow extension        Wrist flexion        Wrist extension        Wrist ulnar deviation  Wrist radial deviation        Wrist pronation        Wrist supination        Grip strength (lbs)        (Blank  rows = not tested)   SHOULDER SPECIAL TESTS:               JOINT MOBILITY TESTING:  Capsular pattern Rt shoulder worse than left   PALPATION:                TODAY'S TREATMENT:  04/11/22 UBE L3 2.5 min fwd, 2.5 min retro Pulleys 3 min flexion, 2 min abduction Supine AAROM with dow shoulder flexion, abd, ER 5 sec  X 10 reps each bilat PROM Rt shoulder all planes to tolerance, GH mobs Rt shoulder inferior glide and A-P glides, PROM Lt shoulder flexion Estim X 10 min pre mod with intensity level to tolerance to bilateral shoulders.      PATIENT EDUCATION: Education details: HEP,PT plan of care Person educated: Patient Education method: Explanation, Demonstration, Verbal cues, and Handouts Education comprehension: verbalized understanding, returned demonstration, and needs further education     HOME EXERCISE PROGRAM: Access Code: [email or web address] URL: [email or web address] Date: 03/14/2022 Prepared by: Elsie Ra   Exercises - Circular Shoulder Pendulum with Table Support  - 3 x daily - 7 x weekly - 1 sets - 20 reps - Seated Shoulder Abduction Towel Slide at Table Top  - 3 x daily - 6 x weekly - 1-2 sets - 10 reps - 5 hold - Seated Shoulder Flexion Towel Slide at Table Top  - 3 x daily - 6 x weekly - 1-2 sets - 10 reps - Seated Shoulder External Rotation PROM on Table  - 3 x daily - 6 x weekly - 1-2 sets - 10 reps - 5 hold - Supine Shoulder Flexion AAROM with Hands Clasped  - 3 x daily - 6 x weekly - 1-2 sets - 10 reps   ASSESSMENT:   CLINICAL IMPRESSION: He still is limited by pain with overall poor activity tolerance making it difficult to progress him in PT. This was more limited today due to playing guitar this week. I did trial E stim today to see if this helps reduce pain any. His ROM measurements have improved some but he is still very limited in his ROM and strength in his shoulders. This was the last visit he has and will follow up for MD next and may have  upcoming shoulder replacement surgery. If MD wants him to have more PT in the meantime then we can certainly set him up with more.  OBJECTIVE IMPAIRMENTS: decreased activity tolerance, decreased shoulder mobility, decreased ROM, decreased strength, impaired flexibility, impaired UE use, postural dysfunction, and pain.   ACTIVITY LIMITATIONS: reaching, lifting, carry,  cleaning, driving, and or occupation   PERSONAL FACTORS:  RTC tear and OA on MRI, Rt UE medial nerve surgery,NSTEMI, PVD and DVT after ankle Fx,PE,seizure are also affecting patient's functional outcome.   REHAB POTENTIAL: fair   CLINICAL DECISION MAKING: Stable/uncomplicated   EVALUATION COMPLEXITY: Low       GOALS: Short term PT Goals Target date: 04/11/2022 Pt will be I and compliant with HEP. Baseline:  Goal status: New Pt will decrease pain by 25% overall Baseline: Goal status: New   Long term PT goals Target date: 04/25/2022 Pt will improve bilat shoulder AROM to WFL (90 deg flexion and abd, 45 deg IR/ER) to improve functional reaching Baseline: Goal status:  New Pt will improve FOTO to at least 67% functional to show improved function Baseline: Goal status: New Pt will reduce pain to overall less than ]50% with usual activity and work activity. Baseline: Goal status: New   PLAN: PT FREQUENCY: 2-3 times per week    PT DURATION: 6 weeks   PLANNED INTERVENTIONS (unless contraindicated): aquatic PT, Canalith repositioning, cryotherapy, Electrical stimulation, Iontophoresis with 4 mg/ml dexamethasome, Moist heat, traction, Ultrasound, gait training, Therapeutic exercise, balance training, neuromuscular re-education, patient/family education, prosthetic training, manual techniques, passive ROM, dry needling, taping, vasopnuematic device, vestibular, spinal manipulations, joint manipulations   PLAN FOR NEXT SESSION: What did MD say and what were recommendations?   Debbe Odea, PT,DPT 04/11/2022, 11:42 AM

## 2022-04-16 ENCOUNTER — Ambulatory Visit (INDEPENDENT_AMBULATORY_CARE_PROVIDER_SITE_OTHER): Payer: BC Managed Care – PPO | Admitting: Orthopaedic Surgery

## 2022-04-16 ENCOUNTER — Encounter: Payer: Self-pay | Admitting: Orthopaedic Surgery

## 2022-04-16 DIAGNOSIS — M25512 Pain in left shoulder: Secondary | ICD-10-CM

## 2022-04-16 DIAGNOSIS — M12812 Other specific arthropathies, not elsewhere classified, left shoulder: Secondary | ICD-10-CM

## 2022-04-16 DIAGNOSIS — M12811 Other specific arthropathies, not elsewhere classified, right shoulder: Secondary | ICD-10-CM

## 2022-04-16 DIAGNOSIS — M25511 Pain in right shoulder: Secondary | ICD-10-CM

## 2022-04-16 DIAGNOSIS — G8929 Other chronic pain: Secondary | ICD-10-CM

## 2022-04-16 NOTE — Progress Notes (Signed)
The patient comes in today for continued evaluation of rotator cuff arthropathy of both shoulders.  His right is much worse than his left.  He had a flareup of pain after cleaning his car and playing bass at a funeral recently.  The right shoulder is really bothering him quite a bit.  He has been through extensive physical therapy for that shoulder and he has seen my partner Dr. Dorene Grebe for his shoulders.  He is actually supposed to follow-up with Dr. August Saucer but went through my chart and got a follow-up with me today.  He said no other acute change in his medical status.  He understands that I would like him to see Dr. August Saucer for his shoulders and he agrees with this as well and will reschedule to see Dr. August Saucer in the near future.  The patient is fine with this as well.

## 2022-05-02 ENCOUNTER — Ambulatory Visit (INDEPENDENT_AMBULATORY_CARE_PROVIDER_SITE_OTHER): Payer: BC Managed Care – PPO | Admitting: Orthopedic Surgery

## 2022-05-02 DIAGNOSIS — M12812 Other specific arthropathies, not elsewhere classified, left shoulder: Secondary | ICD-10-CM | POA: Diagnosis not present

## 2022-05-02 DIAGNOSIS — M12811 Other specific arthropathies, not elsewhere classified, right shoulder: Secondary | ICD-10-CM | POA: Diagnosis not present

## 2022-05-05 ENCOUNTER — Encounter: Payer: Self-pay | Admitting: Orthopedic Surgery
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
## 2022-05-07 NOTE — Addendum Note (Signed)
Addended byPrescott Parma on: 05/07/2022 10:42 AM   Modules accepted: Orders

## 2022-05-09 ENCOUNTER — Encounter: Payer: Self-pay | Admitting: Cardiovascular Disease

## 2022-05-09 ENCOUNTER — Ambulatory Visit (INDEPENDENT_AMBULATORY_CARE_PROVIDER_SITE_OTHER): Payer: BC Managed Care – PPO | Admitting: Cardiovascular Disease

## 2022-05-09 VITALS — BP 110/72 | HR 96 | Ht 74.0 in | Wt 200.0 lb

## 2022-05-09 DIAGNOSIS — R531 Weakness: Secondary | ICD-10-CM | POA: Diagnosis not present

## 2022-05-09 NOTE — Progress Notes (Signed)
Mr. Cameron Kim  returns today for follow-up of his test.  2D echo was normal and event monitor showed occasional P VCs with short runs of SVT and nonsustained ventricular tachycardia.  I do not think he has anything cardiovascular going on.  He is generally weak.  He may have a rheumatologic condition such as PMR.  I suggested that he approach this with his primary care provider.  I will see him back as needed.  Runell Gess, M.D., FACP, [facility], [facility] [address] [place], [place]  [address]  [phone] 05/09/2022 10:21 AM

## 2022-05-09 NOTE — Patient Instructions (Signed)
Medication Instructions:  No Changes In Medications at this time.  *If you need a refill on your cardiac medications before your next appointment, please call your pharmacy*  Lab Work: None Ordered At This Time.  If you have labs (blood work) drawn today and your tests are completely normal, you will receive your results only by: MyChart Message (if you have MyChart) OR A paper copy in the mail If you have any lab test that is abnormal or we need to change your treatment, we will call you to review the results.  Testing/Procedures: None Ordered At This Time.   Follow-Up: At [facility], you and your health needs are our priority.  As part of our continuing mission to provide you with exceptional heart care, we have created designated Provider Care Teams.  These Care Teams include your primary Cardiologist (physician) and Advanced Practice Providers (APPs -  Physician Assistants and Nurse Practitioners) who all work together to provide you with the care you need, when you need it.  Your next appointment:   AS NEEDED   The format for your next appointment:   In Person  Provider:   Nanetta Batty, MD    RHEUMATOLOGIST TO RULE OUT POLYMYALGIA RHEUMATICA- PLEASE DISCUSS THIS WITH YOUR PRIMARY CARE PROVIDER

## 2022-05-11 ENCOUNTER — Encounter: Payer: Self-pay | Admitting: Family Medicine

## 2022-05-11 ENCOUNTER — Ambulatory Visit (INDEPENDENT_AMBULATORY_CARE_PROVIDER_SITE_OTHER): Payer: BC Managed Care – PPO | Admitting: Family Medicine

## 2022-05-11 VITALS — BP 132/82 | HR 82 | Temp 99.9°F | Resp 16 | Ht 74.0 in | Wt 203.5 lb

## 2022-05-11 DIAGNOSIS — R7401 Elevation of levels of liver transaminase levels: Secondary | ICD-10-CM | POA: Diagnosis not present

## 2022-05-11 DIAGNOSIS — I82432 Acute embolism and thrombosis of left popliteal vein: Secondary | ICD-10-CM

## 2022-05-11 DIAGNOSIS — R770 Abnormality of albumin: Secondary | ICD-10-CM

## 2022-05-11 DIAGNOSIS — R531 Weakness: Secondary | ICD-10-CM

## 2022-05-11 LAB — CBC WITH DIFFERENTIAL/PLATELET
Basophils Absolute: 0.2 10*3/uL — ABNORMAL HIGH (ref 0.0–0.1)
Basophils Relative: 1.3 % (ref 0.0–3.0)
Eosinophils Absolute: 0.4 10*3/uL (ref 0.0–0.7)
Eosinophils Relative: 2.7 % (ref 0.0–5.0)
HCT: 42.2 % (ref 39.0–52.0)
Hemoglobin: 13.3 g/dL (ref 13.0–17.0)
Lymphocytes Relative: 13.5 % (ref 12.0–46.0)
Lymphs Abs: 2 10*3/uL (ref 0.7–4.0)
MCHC: 31.6 g/dL (ref 30.0–36.0)
MCV: 88.6 fl (ref 78.0–100.0)
Monocytes Absolute: 1.1 10*3/uL — ABNORMAL HIGH (ref 0.1–1.0)
Monocytes Relative: 7.5 % (ref 3.0–12.0)
Neutro Abs: 11.2 10*3/uL — ABNORMAL HIGH (ref 1.4–7.7)
Neutrophils Relative %: 75 % (ref 43.0–77.0)
Platelets: 516 10*3/uL — ABNORMAL HIGH (ref 150.0–400.0)
RBC: 4.76 Mil/uL (ref 4.22–5.81)
RDW: 16.8 % — ABNORMAL HIGH (ref 11.5–15.5)
WBC: 15 10*3/uL — ABNORMAL HIGH (ref 4.0–10.5)

## 2022-05-11 LAB — HEPATIC FUNCTION PANEL
ALT: 30 U/L (ref 0–53)
AST: 106 U/L — ABNORMAL HIGH (ref 0–37)
Albumin: 3 g/dL — ABNORMAL LOW (ref 3.5–5.2)
Alkaline Phosphatase: 62 U/L (ref 39–117)
Bilirubin, Direct: 0.1 mg/dL (ref 0.0–0.3)
Total Bilirubin: 0.7 mg/dL (ref 0.2–1.2)
Total Protein: 8 g/dL (ref 6.0–8.3)

## 2022-05-11 LAB — BASIC METABOLIC PANEL
BUN: 18 mg/dL (ref 6–23)
CO2: 28 mEq/L (ref 19–32)
Calcium: 8.9 mg/dL (ref 8.4–10.5)
Chloride: 100 mEq/L (ref 96–112)
Creatinine, Ser: 0.61 mg/dL (ref 0.40–1.50)
GFR: 111.19 mL/min (ref >60.00)
Glucose, Bld: 74 mg/dL (ref 70–99)
Potassium: 4.6 mEq/L (ref 3.5–5.1)
Sodium: 134 mEq/L — ABNORMAL LOW (ref 135–145)

## 2022-05-11 LAB — SEDIMENTATION RATE: Sed Rate: 100 mm/h — ABNORMAL HIGH (ref 0–20)

## 2022-05-11 LAB — TSH: TSH: 4.62 u[IU]/mL (ref 0.35–5.50)

## 2022-05-11 NOTE — Patient Instructions (Addendum)
Follow up in 6-8 weeks to recheck weight We'll notify you of your lab results and make any changes if needed We'll call you with the Hematology appt to determine if Xarelto is still needed We'll call you with your rheumatology appt Make sure you are eating regularly- protein shakes, meat are really important to get your protein levels up Call with any questions or concerns Hang in there!!!

## 2022-05-11 NOTE — Progress Notes (Signed)
   Subjective:    Patient ID: Cameron Kim, male    DOB: 1971/09/24, 51 y.o.   MRN: [id number]  HPI Surgical clearance- received a note from Ortho that they are concerned about 3 things- 1) duration of Xarelto, 2) is there an underlying cause for weight loss, and 3) his low albumin.    Pt currently stopped the Xarelto b/c he was not able to afford it.  Pt is >1 yr out from clot and had 9 months of Xarelto tx  Pt has gained 5 lbs since CPE on 6/12.  He is frustrated that he has not gained more weight back.  Still notes excessive weakness 'every where'.  Limited mobility.  Cards is concerned for Rheumatologic process   Review of Systems For ROS see HPI     Objective:   Physical Exam Vitals reviewed.  Constitutional:      General: He is not in acute distress.    Appearance: Normal appearance. He is ill-appearing.  HENT:     Head: Normocephalic and atraumatic.  Cardiovascular:     Rate and Rhythm: Normal rate and regular rhythm.     Pulses: Normal pulses.     Heart sounds: Normal heart sounds.  Pulmonary:     Effort: Pulmonary effort is normal. No respiratory distress.     Breath sounds: Normal breath sounds. No wheezing.  Abdominal:     General: There is no distension.     Palpations: Abdomen is soft.     Tenderness: There is no abdominal tenderness. There is no guarding.  Skin:    General: Skin is warm and dry.     Coloration: Skin is not jaundiced or pale.  Neurological:     Mental Status: He is alert and oriented to person, place, and time.  Psychiatric:     Comments: Flat affect, withdrawn           Assessment & Plan:

## 2022-05-13 DIAGNOSIS — R770 Abnormality of albumin: Secondary | ICD-10-CM | POA: Insufficient documentation

## 2022-05-13 NOTE — Assessment & Plan Note (Signed)
Indicative of poor nutritional status.  This needs to improve prior to surgery.  Discussed need for increased protein in diet, regular meals.  Will follow closely.

## 2022-05-13 NOTE — Assessment & Plan Note (Signed)
Ongoing issue for pt.  Cardiology mentioned concern for possible rheumatologic process.  Will get ANA and ESR to assess.  Given the length of time a rheum referral takes, will refer today while waiting for lab results.  Pt expressed understanding and is in agreement w/ plan.

## 2022-05-13 NOTE — Assessment & Plan Note (Signed)
Pt completed 9 months of Xarelto therapy.  He is not currently on Xarelto due to cost of medication.  Will refer to Hematology for complete evaluation and to determine whether he needs to continue tx.  Pt expressed understanding and is in agreement w/ plan.

## 2022-05-14 ENCOUNTER — Telehealth: Payer: Self-pay

## 2022-05-14 ENCOUNTER — Telehealth: Payer: Self-pay | Admitting: Physician Assistant

## 2022-05-14 LAB — ANTI-NUCLEAR AB-TITER (ANA TITER): ANA Titer 1: 1:1280 {titer} — ABNORMAL HIGH

## 2022-05-14 LAB — PREALBUMIN: Prealbumin: 7 mg/dL — ABNORMAL LOW (ref 21–43)

## 2022-05-14 LAB — HIV ANTIBODY (ROUTINE TESTING W REFLEX): HIV 1&2 Ab, 4th Generation: NONREACTIVE

## 2022-05-14 LAB — ANA: Anti Nuclear Antibody (ANA): POSITIVE — AB

## 2022-05-14 NOTE — Telephone Encounter (Signed)
Scheduled appt per 8/4 referral. Pt was already established with [name]. Pt's wife is aware of appt date and time. Pt's wife is aware to arrive 15 mins prior to appt time and to bring and updated insurance card. Pt's wife is aware of appt location.

## 2022-05-14 NOTE — Telephone Encounter (Signed)
-----  Message from Midge Minium, MD sent at 05/13/2022  5:05 PM EDT ----- Your ANA and ESR are both elevated.  These are markers for rheumatologic processes and why we referred you to Rheumatology for a complete evaluation.  Your albumin and prealbumin are both low.  These are markers of nutritional status and protein stores.  Based on this, I need you to eat a high protein diet and drink protein shakes to get your levels up  Your white blood cells remain elevated.  This could be due to the rheumatologic thing that's going on as we haven't found an obvious source of infection  Your AST continues to slowly rise so I would like to get an ultrasound of your liver to assess (ordered)

## 2022-05-15 NOTE — Telephone Encounter (Signed)
Spoke to the pt and advised of lab results  

## 2022-05-16 ENCOUNTER — Ambulatory Visit: Payer: BC Managed Care – PPO | Admitting: Gastroenterology

## 2022-05-16 ENCOUNTER — Ambulatory Visit
Admission: RE | Admit: 2022-05-16 | Discharge: 2022-05-16 | Disposition: A | Discharge status: Home / Self Care | Payer: BC Managed Care – PPO | Source: Ambulatory Visit | Attending: Orthopedic Surgery | Admitting: Orthopedic Surgery

## 2022-05-16 DIAGNOSIS — M25511 Pain in right shoulder: Secondary | ICD-10-CM | POA: Diagnosis not present

## 2022-05-16 DIAGNOSIS — M12811 Other specific arthropathies, not elsewhere classified, right shoulder: Secondary | ICD-10-CM

## 2022-05-16 NOTE — Progress Notes (Signed)
Med w/u in progress Needs f/u 3 mos

## 2022-05-17 ENCOUNTER — Telehealth: Payer: Self-pay | Admitting: Orthopedic Surgery

## 2022-05-17 ENCOUNTER — Telehealth: Payer: Self-pay | Admitting: Surgery

## 2022-05-17 NOTE — Telephone Encounter (Signed)
Called patient today to set up 3 month follow up with August Saucer like I was advised and he was concerned to why he had to come in 3 months and he has been dealing with shoulder issues for about a year, he states he is ready for surgery soon please advise

## 2022-05-18 NOTE — Telephone Encounter (Signed)
His prealbumin is 7 and his albumin is 3.0.  His numerical values are not compatible with elective shoulder replacement based on current literature.  His nutritional values need to normalize before he qualifies for shoulder replacement.  Estimation that will take about 3 months.  If Patient wants to check it sooner.  Lets recheck him in 6 weeks with his laboratory.  Please call thanks

## 2022-05-21 NOTE — Telephone Encounter (Signed)
error 

## 2022-05-22 NOTE — Telephone Encounter (Signed)
IC LM for patient to call me back.

## 2022-05-24 ENCOUNTER — Telehealth: Payer: Self-pay | Admitting: Orthopedic Surgery

## 2022-05-24 NOTE — Telephone Encounter (Signed)
Patient called. Would like to know what he can do to normalize his nutritional values. His call back number is [phone]

## 2022-05-25 ENCOUNTER — Inpatient Hospital Stay: Payer: BC Managed Care – PPO

## 2022-05-25 ENCOUNTER — Other Ambulatory Visit: Payer: Self-pay | Admitting: Physician Assistant

## 2022-05-25 ENCOUNTER — Other Ambulatory Visit: Payer: Self-pay

## 2022-05-25 ENCOUNTER — Inpatient Hospital Stay: Payer: BC Managed Care – PPO | Attending: Physician Assistant | Admitting: Physician Assistant

## 2022-05-25 VITALS — BP 122/66 | HR 104 | Temp 98.4°F | Resp 15 | Wt 205.5 lb

## 2022-05-25 DIAGNOSIS — Z86711 Personal history of pulmonary embolism: Secondary | ICD-10-CM | POA: Insufficient documentation

## 2022-05-25 DIAGNOSIS — Z7901 Long term (current) use of anticoagulants: Secondary | ICD-10-CM | POA: Diagnosis not present

## 2022-05-25 DIAGNOSIS — Z86718 Personal history of other venous thrombosis and embolism: Secondary | ICD-10-CM | POA: Diagnosis not present

## 2022-05-25 DIAGNOSIS — D72829 Elevated white blood cell count, unspecified: Secondary | ICD-10-CM | POA: Diagnosis not present

## 2022-05-25 DIAGNOSIS — Z79899 Other long term (current) drug therapy: Secondary | ICD-10-CM | POA: Insufficient documentation

## 2022-05-25 DIAGNOSIS — I82432 Acute embolism and thrombosis of left popliteal vein: Secondary | ICD-10-CM

## 2022-05-25 LAB — CMP (CANCER CENTER ONLY)
ALT: 31 U/L (ref 0–44)
AST: 92 U/L — ABNORMAL HIGH (ref 15–41)
Albumin: 2.4 g/dL — ABNORMAL LOW (ref 3.5–5.0)
Alkaline Phosphatase: 60 U/L (ref 38–126)
Anion gap: 5 (ref 5–15)
BUN: 23 mg/dL — ABNORMAL HIGH (ref 6–20)
CO2: 29 mmol/L (ref 22–32)
Calcium: 8.1 mg/dL — ABNORMAL LOW (ref 8.9–10.3)
Chloride: 103 mmol/L (ref 98–111)
Creatinine: 0.69 mg/dL (ref 0.61–1.24)
GFR, Estimated: >60 mL/min (ref >60)
Glucose, Bld: 102 mg/dL — ABNORMAL HIGH (ref 70–99)
Potassium: 4 mmol/L (ref 3.5–5.1)
Sodium: 137 mmol/L (ref 135–145)
Total Bilirubin: 0.4 mg/dL (ref 0.3–1.2)
Total Protein: 7.2 g/dL (ref 6.5–8.1)

## 2022-05-25 LAB — CBC WITH DIFFERENTIAL (CANCER CENTER ONLY)
Abs Immature Granulocytes: 0.06 10*3/uL (ref 0.00–0.07)
Basophils Absolute: 0.1 10*3/uL (ref 0.0–0.1)
Basophils Relative: 1 %
Eosinophils Absolute: 0.3 10*3/uL (ref 0.0–0.5)
Eosinophils Relative: 2 %
HCT: 37 % — ABNORMAL LOW (ref 39.0–52.0)
Hemoglobin: 12 g/dL — ABNORMAL LOW (ref 13.0–17.0)
Immature Granulocytes: 0 %
Lymphocytes Relative: 16 %
Lymphs Abs: 2.8 10*3/uL (ref 0.7–4.0)
MCH: 28.6 pg (ref 26.0–34.0)
MCHC: 32.4 g/dL (ref 30.0–36.0)
MCV: 88.3 fL (ref 80.0–100.0)
Monocytes Absolute: 1.3 10*3/uL — ABNORMAL HIGH (ref 0.1–1.0)
Monocytes Relative: 8 %
Neutro Abs: 13.1 10*3/uL — ABNORMAL HIGH (ref 1.7–7.7)
Neutrophils Relative %: 73 %
Platelet Count: 455 10*3/uL — ABNORMAL HIGH (ref 150–400)
RBC: 4.19 MIL/uL — ABNORMAL LOW (ref 4.22–5.81)
RDW: 16.3 % — ABNORMAL HIGH (ref 11.5–15.5)
WBC Count: 17.6 10*3/uL — ABNORMAL HIGH (ref 4.0–10.5)
nRBC: 0 % (ref 0.0–0.2)

## 2022-05-25 MED ORDER — APIXABAN 5 MG PO TABS: 5.0000 mg | ORAL_TABLET | Freq: Two times a day (BID) | ORAL | 3 refills | Status: AC

## 2022-05-25 NOTE — Progress Notes (Unsigned)
[facility] [phone]   [phone]  PROGRESS NOTE  Patient Care Team: Sheliah Hatch, MD as PCP - General (Family Medicine) Runell Gess, MD as PCP - Cardiology (Cardiology)  Hematological/Oncological History  1) 05/2012: Diagnosed with pulmonary embolism and left lower leg DVT. Treated with coumadin. Provoking factor felt to be left ankle fracture and prolonged immobility with car travel. (Records not available for review).  2)04/17/2021: Doppler US confirmed acute DVT involving the left popliteal and left gastrocnemius vein. Started on Xarelto.   3) 05/02/2021: Established care with Cameron Kaufmann PA-C   CHIEF COMPLAINTS/PURPOSE OF CONSULTATION:  "Recurrent DVT"  HISTORY OF PRESENTING ILLNESS:  Cameron Kim 51 y.o. male returns for a follow up for history of recurrent DVT. He is accompanied by his wife for this visit.   On exam today, [name] reports that he is no longer on Xarelto because he was not able to afford it. He stopped Xarelto after 9 months of treatment. While on Xarelto. He denied any bruising or bleeding episodes.   In the interim, [name] has lost a significant amount of weight approximately 50 lbs in 9 months. During this period he was incarcerated. Additionally, he reports fatigue and diffuse joint pain with stiffness. He reports swelling in his hands and feet. This is limiting his mobility. He denies nausea, vomiting or abdominal pain. He fevers, chils, sweats, shortness of breath, chest pain, cough, headaches, dizziness, neuropathy. He has ho other complaints.  Rest of the 10 point ROS is below.  MEDICAL HISTORY:  Past Medical History:  Diagnosis Date   Abnormal EKG    Ankle fracture, left    Peripheral vascular disease (HCC) 2013   dvt-pe after ankle fx   Pulmonary embolism (HCC) 05/2012   Seizure disorder (HCC) 11/07/2020    SURGICAL HISTORY: Past Surgical History:  Procedure Laterality Date   I & D  EXTREMITY Right 06/07/2016   Procedure: IRRIGATION AND DEBRIDEMENT RIGHT WRIST,MEDIAN NERVE EXPLORATION NEUROLYSIS AND REPAIR;  Surgeon: Dominica Severin, MD;  Location: [facility] OR;  Service: Orthopedics;  Laterality: Right;   medial nerve surgry right arm     NO PAST SURGERIES      SOCIAL HISTORY: Social History   Socioeconomic History   Marital status: Married    Spouse name: Not on file   Number of children: Not on file   Years of education: Not on file   Highest education level: Not on file  Occupational History   Not on file  Tobacco Use   Smoking status: Never   Smokeless tobacco: Never  Vaping Use   Vaping Use: Never used  Substance and Sexual Activity   Alcohol use: Yes    Alcohol/week: 3.0 standard drinks of alcohol    Types: 1 Glasses of wine, 1 Cans of beer, 1 Shots of liquor per week    Comment: socially   Drug use: No   Sexual activity: Yes  Other Topics Concern   Not on file  Social History Narrative   Not on file   Social Determinants of Health   Financial Resource Strain: Not on file  Food Insecurity: Not on file  Transportation Needs: Not on file  Physical Activity: Not on file  Stress: Not on file  Social Connections: Not on file  Intimate Partner Violence: Not on file    FAMILY HISTORY: Family History  Problem Relation Age of Onset   Diabetes Mother    Hypertension Mother    Heart murmur  Mother    Stroke Mother    Diabetes Father    Hypertension Father    Stroke Sister    Diabetes Paternal Grandmother    Diabetes Paternal Grandfather     ALLERGIES:  has No Known Allergies.  MEDICATIONS:  Current Outpatient Medications  Medication Sig Dispense Refill   apixaban (ELIQUIS) 5 MG TABS tablet Take 1 tablet (5 mg total) by mouth 2 (two) times daily. 60 tablet 3   clotrimazole-betamethasone (LOTRISONE) cream Apply 1 application  topically 2 (two) times daily. 45 g 1   doxycycline (VIBRA-TABS) 100 MG tablet Take 1 tablet (100 mg total) by mouth 2  (two) times daily. 20 tablet 0   furosemide (LASIX) 20 MG tablet Take 2 tablets (40 mg total) by mouth daily. 90 tablet 3   triamcinolone ointment (KENALOG) 0.1 % Apply 1 application. topically 2 (two) times daily. 454 g 1   No current facility-administered medications for this visit.    REVIEW OF SYSTEMS:   Constitutional: ( - ) fevers, ( - )  chills , ( - ) night sweats Eyes: ( - ) blurriness of vision, ( - ) double vision, ( - ) watery eyes Ears, nose, mouth, throat, and face: ( - ) mucositis, ( - ) sore throat Respiratory: ( - ) cough, ( - ) dyspnea, ( - ) wheezes Cardiovascular: ( - ) palpitation, ( - ) chest discomfort, ( - ) lower extremity swelling Gastrointestinal:  ( - ) nausea, ( - ) heartburn, ( - ) change in bowel habits Skin: ( - ) abnormal skin rashes Lymphatics: ( - ) new lymphadenopathy, ( - ) easy bruising Neurological: ( - ) numbness, ( - ) tingling, ( - ) new weaknesses Behavioral/Psych: ( - ) mood change, ( - ) new changes  All other systems were reviewed with the patient and are negative.  PHYSICAL EXAMINATION: ECOG PERFORMANCE STATUS: 0 - Asymptomatic  Vitals:   05/25/22 1525  BP: 122/66  Pulse: (!) 104  Resp: 15  Temp: 98.4 F (36.9 C)  SpO2: 93%   Filed Weights   05/25/22 1525  Weight: 205 lb 8 oz (93.2 kg)    GENERAL: well appearing male in NAD  SKIN: skin color, texture, turgor are normal, no rashes or significant lesions EYES: conjunctiva are pink and non-injected, sclera clear OROPHARYNX: no exudate, no erythema; lips, buccal mucosa, and tongue normal  NECK: supple, non-tender LYMPH:  no palpable lymphadenopathy in the cervical or supraclavicular lymph nodes.  LUNGS: clear to auscultation and percussion with normal breathing effort HEART: regular rate & rhythm and no murmurs Musculoskeletal: no cyanosis of digits and no clubbing. Swelling of hands and feet, bilateral PSYCH: alert & oriented x 3, fluent speech NEURO: no focal motor/sensory  deficits  LABORATORY DATA:  I have reviewed the data as listed    Latest Ref Rng & Units 05/25/2022    3:07 PM 05/11/2022    9:25 AM 03/20/2022    8:02 AM  CBC  WBC 4.0 - 10.5 K/uL 17.6  15.0  14.9   Hemoglobin 13.0 - 17.0 g/dL 32.6  71.2  45.8   Hematocrit 39.0 - 52.0 % 37.0  42.2  39.9   Platelets 150 - 400 K/uL 455  516.0  434.0        Latest Ref Rng & Units 05/25/2022    3:07 PM 05/11/2022    9:25 AM 03/20/2022    8:02 AM  CMP  Glucose 70 - 99 mg/dL 099  74  80   BUN 6 - 20 mg/dL 23  18  18    Creatinine 0.61 - 1.24 mg/dL  9.38  1.82   Sodium 135 - 145 mmol/L 137  134  136   Potassium 3.5 - 5.1 mmol/L 4.0  4.6  4.4   Chloride 98 - 111 mmol/L 103  100  98   CO2 22 - 32 mmol/L 29  28  30    Calcium 8.9 - 10.3 mg/dL 8.1  8.9  9.2   Total Protein 6.5 - 8.1 g/dL 7.2  8.0  7.7   Total Bilirubin 0.3 - 1.2 mg/dL 0.4  0.7  0.8   Alkaline Phos 38 - 126 U/L 60  62  63   AST 15 - 41 U/L 92  106  93   ALT 0 - 44 U/L 31  30  23     RADIOGRAPHIC STUDIES: I have personally reviewed the radiological images as listed and agreed with the findings in the report. CT SHOULDER RIGHT WO CONTRAST  Result Date: 05/16/2022 CLINICAL DATA:  Right shoulder pain, evaluate prior to arthroplasty EXAM: CT OF THE UPPER RIGHT EXTREMITY WITHOUT CONTRAST TECHNIQUE: Multidetector CT imaging of the upper right extremity was performed according to the standard protocol. RADIATION DOSE REDUCTION: This exam was performed according to the departmental dose-optimization program which includes automated exposure control, adjustment of the mA and/or kV according to patient size and/or use of iterative reconstruction technique. COMPARISON:  None Available. FINDINGS: Bones/Joint/Cartilage No fracture or dislocation. Normal alignment. No joint effusion. Moderate osteoarthritis of the right glenohumeral joint. Mild arthropathy of the acromioclavicular joint. Ligaments Ligaments are suboptimally evaluated by CT. Muscles and  Tendons Muscles are normal. No muscle atrophy. Complete tear of the supraspinatus and infraspinatus tendons. Soft tissue No fluid collection or hematoma. No soft tissue mass. Visualized portions of the lung are clear. IMPRESSION: 1. Complete tear of the supraspinatus and infraspinatus tendons. 2. Moderate osteoarthritis of the right glenohumeral joint. Electronically Signed   By: M.D.   On: 05/16/2022 11:33    ASSESSMENT & PLAN Zakhi Elige Ko is a 51 y.o. male returns for a follow up for recurrent DVTs.    #Recurrent LE DVT: --First episode in 2013 involved PE and DVT in left lower leg following left ankle fracture and prolonged car travel. --Second episode occurred on 04/17/2021 whcih involved DVT of left popliteal vein and the gastrocnemius veins. --There is no provoking factor that caused recent DVT on 04/17/2021 so recommend indefinite anticoagulation. --Discontinued Xarelto around April/May 2023 due to cost.  --Hypercoagulable workup from 05/02/2021 was unremarkable except for mild elevation of anticardiolipin IgM levels. Plan to repeat anticardiolipin level today.  --Discussed trying another anticoagulant agent so will try Eliquis 5 mg twice daily and then enroll in patient assistance program if copay is high.  --RTC in 3 months with repeat labs to see Dr. 06/18/2021  #Leukocytosis/Thrombocytosis: --No infectious symptoms and patient is afebrile today --Sed rate was elevated and positive ANA levels when PCP checked on 05/11/2022 which is concerning for rheumatological process --Currently scheduled for consultation with rheumatology in November 2023. I will reach out rheumatology team to try to expedite consultation.   #Normocytic Anemia: --etiologies include inflammatory process, nutritional deficiencies,  --evidence of B12 and folate deficiency confirmed with labs on 02/08/2022.  --Was taking B12 and folate supplementation but not currently.  --Advised to follow up with PCP to recheck  levels to determine if he needs to continue.   No orders  of the defined types were placed in this encounter.   All questions were answered. The patient knows to call the clinic with any problems, questions or concerns.  I have spent a total of 30 minutes minutes of face-to-face and non-face-to-face time, preparing to see the patient, performing a medically appropriate examination, counseling and educating the patient, ordering tests, documenting clinical information in the electronic health record, and care coordination.   Cameron Kaufmann, PA-C Department of Hematology/Oncology [facility] Phone: [phone]

## 2022-05-26 LAB — CARDIOLIPIN ANTIBODIES, IGG, IGM, IGA
Anticardiolipin IgA: <9 APL U/mL (ref 0–11)
Anticardiolipin IgG: <9 GPL U/mL (ref 0–14)
Anticardiolipin IgM: 18 MPL U/mL — ABNORMAL HIGH (ref 0–12)

## 2022-05-28 ENCOUNTER — Telehealth: Payer: Self-pay

## 2022-05-28 DIAGNOSIS — Z86718 Personal history of other venous thrombosis and embolism: Secondary | ICD-10-CM | POA: Insufficient documentation

## 2022-05-28 NOTE — Telephone Encounter (Signed)
For him he really needs to talk to a nutritionist.

## 2022-05-28 NOTE — Telephone Encounter (Signed)
can you follow up with patient to see if he started the eliquis prescription? can you also confirm if he is taking folic acid and B12 supplements   T/C to pt and he was given a prescription on Friday at [facility] for Xarelto and started taking it. He has stopped taking folic acid and B12.  Pt advised to call [facility] and explain that he was suppose to start Eliquis not Xarelto.  Pt called and will pick up Eliquis rx this evening.  Pt advised to restart folic acid and B12.

## 2022-05-29 NOTE — Telephone Encounter (Signed)
IC s/w patient and advised. He verbalized understanding.  

## 2022-05-31 ENCOUNTER — Ambulatory Visit (INDEPENDENT_AMBULATORY_CARE_PROVIDER_SITE_OTHER): Payer: BC Managed Care – PPO

## 2022-05-31 ENCOUNTER — Ambulatory Visit: Payer: BC Managed Care – PPO | Attending: Rheumatology | Admitting: Rheumatology

## 2022-05-31 ENCOUNTER — Encounter: Payer: Self-pay | Admitting: Rheumatology

## 2022-05-31 VITALS — BP 108/71 | HR 114 | Resp 15 | Ht 73.0 in | Wt 205.2 lb

## 2022-05-31 DIAGNOSIS — G8929 Other chronic pain: Secondary | ICD-10-CM

## 2022-05-31 DIAGNOSIS — M79642 Pain in left hand: Secondary | ICD-10-CM | POA: Diagnosis not present

## 2022-05-31 DIAGNOSIS — M3320 Polymyositis, organ involvement unspecified: Secondary | ICD-10-CM

## 2022-05-31 DIAGNOSIS — I3139 Other pericardial effusion (noninflammatory): Secondary | ICD-10-CM

## 2022-05-31 DIAGNOSIS — R768 Other specified abnormal immunological findings in serum: Secondary | ICD-10-CM | POA: Diagnosis not present

## 2022-05-31 DIAGNOSIS — Z79899 Other long term (current) drug therapy: Secondary | ICD-10-CM

## 2022-05-31 DIAGNOSIS — Z86718 Personal history of other venous thrombosis and embolism: Secondary | ICD-10-CM

## 2022-05-31 DIAGNOSIS — M79641 Pain in right hand: Secondary | ICD-10-CM

## 2022-05-31 DIAGNOSIS — I252 Old myocardial infarction: Secondary | ICD-10-CM

## 2022-05-31 DIAGNOSIS — R600 Localized edema: Secondary | ICD-10-CM

## 2022-05-31 DIAGNOSIS — R1319 Other dysphagia: Secondary | ICD-10-CM

## 2022-05-31 DIAGNOSIS — I452 Bifascicular block: Secondary | ICD-10-CM

## 2022-05-31 DIAGNOSIS — G40909 Epilepsy, unspecified, not intractable, without status epilepticus: Secondary | ICD-10-CM

## 2022-05-31 DIAGNOSIS — R531 Weakness: Secondary | ICD-10-CM | POA: Diagnosis not present

## 2022-05-31 DIAGNOSIS — R471 Dysarthria and anarthria: Secondary | ICD-10-CM | POA: Diagnosis not present

## 2022-05-31 DIAGNOSIS — M25511 Pain in right shoulder: Secondary | ICD-10-CM

## 2022-05-31 DIAGNOSIS — Z86711 Personal history of pulmonary embolism: Secondary | ICD-10-CM

## 2022-05-31 DIAGNOSIS — D72828 Other elevated white blood cell count: Secondary | ICD-10-CM

## 2022-05-31 DIAGNOSIS — M25512 Pain in left shoulder: Secondary | ICD-10-CM

## 2022-05-31 DIAGNOSIS — R700 Elevated erythrocyte sedimentation rate: Secondary | ICD-10-CM

## 2022-05-31 DIAGNOSIS — R760 Raised antibody titer: Secondary | ICD-10-CM

## 2022-05-31 LAB — CK: Total CK: 3806 U/L — ABNORMAL HIGH (ref 44–196)

## 2022-05-31 MED ORDER — PREDNISONE 20 MG PO TABS: 40.0000 mg | ORAL_TABLET | Freq: Two times a day (BID) | ORAL | 0 refills | Status: DC

## 2022-05-31 NOTE — Patient Instructions (Addendum)
Appointment with [facility]: 06/05/2022 arrival 8:40am [address], [place], [place] [address] [phone]    Prednisone Tablets What is this medication? PREDNISONE (PRED ni sone) treats many conditions such as asthma, allergic reactions, arthritis, inflammatory bowel diseases, adrenal, and blood or bone marrow disorders. It works by decreasing inflammation, slowing down an overactive immune system, or replacing cortisol normally made in the body. Cortisol is a hormone that plays an important role in how the body responds to stress, illness, and injury. It belongs to a group of medications called steroids. This medicine may be used for other purposes; ask your health care provider or pharmacist if you have questions. COMMON BRAND NAME(S): Deltasone, Predone, Sterapred, Sterapred DS What should I tell my care team before I take this medication? They need to know if you have any of these conditions: Cushing's syndrome Diabetes Glaucoma Heart disease High blood pressure Infection (especially a virus infection such as chickenpox, cold sores, or herpes) Kidney disease Liver disease Mental illness Myasthenia gravis Osteoporosis Seizures Stomach or intestine problems Thyroid disease An unusual or allergic reaction to lactose, prednisone, other medications, foods, dyes, or preservatives Pregnant or trying to get pregnant Breast-feeding How should I use this medication? Take this medication by mouth with a glass of water. Follow the directions on the prescription label. Take this medication with food. If you are taking this medication once a day, take it in the morning. Do not take more medication than you are told to take. Do not suddenly stop taking your medication because you may develop a severe reaction. Your care team will tell you how much medication to take. If your care team wants you to stop the medication, the dose may be slowly lowered over time to avoid any  side effects. Talk to your care team about the use of this medication in children. Special care may be needed. Overdosage: If you think you have taken too much of this medicine contact a poison control center or emergency room at once. NOTE: This medicine is only for you. Do not share this medicine with others. What if I miss a dose? If you miss a dose, take it as soon as you can. If it is almost time for your next dose, talk to your care team. You may need to miss a dose or take an extra dose. Do not take double or extra doses without advice. What may interact with this medication? Do not take this medication with any of the following: Metyrapone Mifepristone This medication may also interact with the following: Aminoglutethimide Amphotericin B Aspirin and aspirin-like medications Barbiturates Certain medications for diabetes, like glipizide or glyburide Cholestyramine Cholinesterase inhibitors Cyclosporine Digoxin Diuretics Ephedrine Male hormones, like estrogens and birth control pills Isoniazid Ketoconazole NSAIDS, medications for pain and inflammation, like ibuprofen or naproxen Phenytoin Rifampin Toxoids Vaccines Warfarin This list may not describe all possible interactions. Give your health care provider a list of all the medicines, herbs, non-prescription drugs, or dietary supplements you use. Also tell them if you smoke, drink alcohol, or use illegal drugs. Some items may interact with your medicine. What should I watch for while using this medication? Visit your care team for regular checks on your progress. If you are taking this medication over a prolonged period, carry an identification card with your name and address, the type and dose of your medication, and your care team's name and address. This medication may increase your risk of getting an infection. Tell your care  team if you are around anyone with measles or chickenpox, or if you develop sores or blisters  that do not heal properly. If you are going to have surgery, tell your care team that you have taken this medication within the last twelve months. Ask your care team about your diet. You may need to lower the amount of salt you eat. This medication may increase blood sugar. Ask your care team if changes in diet or medications are needed if you have diabetes. What side effects may I notice from receiving this medication? Side effects that you should report to your care team as soon as possible: Allergic reactions--skin rash, itching, hives, swelling of the face, lips, tongue, or throat Cushing syndrome--increased fat around the midsection, upper back, neck, or face, pink or purple stretch marks on the skin, thinning, fragile skin that easily bruises, unexpected hair growth High blood sugar (hyperglycemia)--increased thirst or amount of urine, unusual weakness or fatigue, blurry vision Increase in blood pressure Infection--fever, chills, cough, sore throat, wounds that don't heal, pain or trouble when passing urine, general feeling of discomfort or being unwell Low adrenal gland function--nausea, vomiting, loss of appetite, unusual weakness or fatigue, dizziness Mood and behavior changes--anxiety, nervousness, confusion, hallucinations, irritability, hostility, thoughts of suicide or self-harm, worsening mood, feelings of depression Stomach bleeding--bloody or black, tar-like stools, vomiting blood or brown material that looks like coffee grounds Swelling of the ankles, hands, or feet Side effects that usually do not require medical attention (report to your care team if they continue or are bothersome): Acne General discomfort and fatigue Headache Increase in appetite Nausea Trouble sleeping Weight gain This list may not describe all possible side effects. Call your doctor for medical advice about side effects. You may report side effects to FDA at 1-800-FDA-1088. Where should I keep my  medication? Keep out of the reach of children. Store at room temperature between 15 and 30 degrees C (59 and 86 degrees F). Protect from light. Keep container tightly closed. Throw away any unused medication after the expiration date. NOTE: This sheet is a summary. It may not cover all possible information. If you have questions about this medicine, talk to your doctor, pharmacist, or health care provider.  2023 Elsevier/Gold Standard (2020-12-07 00:00:00) Polymyositis Polymyositis is a disease that causes inflammation and weakness of the muscles. It can also affect skin tissues. Polymyositis is also known as idiopathic inflammatory myopathy. It is often associated with diseases in which the body mistakenly attacks its own cells and tissues (autoimmune diseases). What are the causes? The cause of polymyositis is not known. What increases the risk? The following factors may make you more likely to develop this condition: Gender. Polymyositis is more common in women. Age. The disease is more common in adults. It is rare before age 57. Ethnicity. It is more common in Black people than White people. What are the signs or symptoms? Symptoms of this condition include: Weakness in your neck, shoulders, upper arms, hips, or thighs. It affects both sides of the body equally. The weakness usually develops over weeks to months. Having difficulty: Rising from a seated position. Climbing stairs. Lifting objects. Reaching overhead. Swallowing. Sore muscles are possible, but often there is little to no pain in the muscles. Fatigue. Fever. Hard bumps under your skin. Unexplained weight loss. How is this diagnosed? This condition may be diagnosed based on your medical history and a physical exam. Various tests may also be done, including: Blood tests to check skeletal muscle enzymes  and inflammation. Often, test results that show inflammation are elevated. MRI. EMG (electromyography). This is a test  to check the electrical activity of your muscles. Biopsy. This is a test in which a sample of muscle tissue is taken and checked under a microscope. How is this treated? There is no cure for polymyositis, but treatment can improve muscle strength and function. The earlier the treatment is started, the more effective it is. Treatment may include: Corticosteroid medicines to help control inflammation. These medicines have serious side effects. Once your symptoms are better controlled, the dose should be lowered to find the lowest possible dose that controls your symptoms. Immunoglobulin medicines to introduce healthy antibodies from blood donors. An antibody is a type of protein that is part of the body's disease-fighting system (immune system). Immunosuppressive medicines to control the activity of the immune system. Physical therapy to strengthen muscles. Speech and language therapy to improve your ability to talk and eat. Follow these instructions at home: Safety  Stay active. An exercise routine can help you build and maintain muscle strength. Talk with your health care provider or your physical therapist before you start any exercise program. If necessary, take steps to prevent falls by: Installing grab bars for your tub, shower, and toilet. Installing a pole that goes from floor to ceiling. This helps you when going from a seated to a standing position. Protect your skin from the sun. Wear sunscreen or protective clothing when you are outside. General instructions  Work closely with your health care team, including physical and speech therapists, if needed. Take over-the-counter and prescription medicines only as told by your health care provider. Avoid alcohol. Rest when you are tired. Do not use any products that contain nicotine or tobacco. These products include cigarettes, chewing tobacco, and vaping devices, such as e-cigarettes. If you need help quitting, ask your health care  provider. Keep all follow-up visits. This is important. Where to find support Myositis Support and Understanding: understandingmyositis.org The Myositis Association: myositis.org Contact a health care provider if you: Develop new or worsening muscle weakness. Get help right away if you have: Trouble swallowing or speaking. Shortness of breath. These symptoms may represent a serious problem that is an emergency. Do not wait to see if the symptoms will go away. Get medical help right away. Call your local emergency services (911 in the [place] Do not drive yourself to the hospital. Summary Polymyositis is a disease that causes inflammation and weakness of your muscles. It can also affect your skin tissues. It is often associated with diseases in which the body attacks its own cells and tissues (autoimmune diseases). The cause of polymyositis is not known. There is no cure for polymyositis, but treatment can improve muscle strength and function. The earlier treatment is started, the more effective it is. This information is not intended to replace advice given to you by your health care provider. Make sure you discuss any questions you have with your health care provider. Document Revised: 01/26/2021 Document Reviewed: 01/26/2021 Elsevier Patient Education  2023 [employer].

## 2022-05-31 NOTE — Progress Notes (Signed)
 Office Visit Note  Patient: Cameron Kim             Date of Birth: 08/15/1971           MRN: [id number]             PCP: Tabori, Katherine E, MD Referring: Tabori, Katherine E, MD Visit Date: 05/31/2022 Occupation: @GUAROCC@  Subjective:  Generalized weakness  History of Present Illness: Cameron Kim is a 51 y.o. male in consultation per request of his PCP for the evaluation of generalized weakness.  According the patient he had 2 episodes of seizure in the past for which he had neurological work-up which was negative.  He did not have any recurrence of seizures since then.  He had an episode of DVT and pulmonary embolism in 2013 after left ankle fracture.  And a second episode of DVT on April 17, 2021.  He was switched from Xarelto to Eliquis recently by the hematologist.  At that time some of the labs showed low titer anticardiolipin IgM and positive ANA.  Patient states that he was incarcerated in July 2022 and was released in May 2023.  Before he went to the prison he had good strength and was able to do most of the activities.  Since November 2022 he has been having progressive weakness.  He also gives history of dysphagia and dysarthria.  He states his neurological work-up was negative.  He has not seen a gastroenterologist for swallowing study.  He has difficulty swallowing.  He states he has lost 60 pounds weight 7 9 months.  He also has noticed decreased strength all over.  He has difficulty getting up from the chair and also getting up from the bed.  He has difficulty raising his arms.  He was evaluated by Dr. Dean who diagnosed her with bilateral rotator cuff tear.  He is scheduled to have right rotator cuff tear in the future.  He states he also had a MRI while he was in the present.  He with had recent evaluation by the cardiologist.  He experiences stiffness in all of his joints and fatigue all over and muscle weakness.  He denies any history of joint swelling.  He gives history of  Raynaud's phenomenon.  He notices weakness in his neck muscles.  There is no history of his statin use.  There is no family history of autoimmune disease.  Activities of Daily Living:  Patient reports morning stiffness for all day. Patient Reports nocturnal pain.  Difficulty dressing/grooming: Reports Difficulty climbing stairs: Reports Difficulty getting out of chair: Reports Difficulty using hands for taps, buttons, cutlery, and/or writing: Reports  Review of Systems  Constitutional:  Positive for fatigue and weight loss.  HENT:  Positive for trouble swallowing, voice change, trouble swallowing and mouth dryness. Negative for mouth sores.   Eyes:  Negative for dryness.  Respiratory:  Positive for shortness of breath.   Cardiovascular:  Negative for chest pain and palpitations.  Gastrointestinal:  Negative for blood in stool, constipation and diarrhea.  Endocrine: Positive for increased urination.  Genitourinary:  Negative for involuntary urination.  Musculoskeletal:  Positive for joint pain, gait problem, joint pain, joint swelling, myalgias, muscle weakness, morning stiffness, muscle tenderness and myalgias.  Skin:  Positive for color change and rash. Negative for sensitivity to sunlight.  Allergic/Immunologic: Negative for susceptible to infections.  Neurological:  Positive for dizziness and numbness. Negative for headaches.  Hematological:  Negative for swollen glands.  Psychiatric/Behavioral:  Positive for sleep   disturbance. Negative for depressed mood. The patient is nervous/anxious.     PMFS History:  Patient Active Problem List   Diagnosis Date Noted   History of DVT (deep vein thrombosis) 05/28/2022   Low serum albumin 05/13/2022   Pericardial effusion 02/28/2022   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   Unintentional weight loss 02/11/2022   Lower extremity edema 02/11/2022   Dysarthria 02/11/2022   Weakness generalized 02/11/2022   Chronic pain of both shoulders 02/11/2022   Dry  skin 02/11/2022   History of non-ST elevation myocardial infarction (NSTEMI) 02/11/2022   Acute deep vein thrombosis (DVT) of lower extremity (HCC) 05/02/2021   Seizure disorder (HCC) 11/07/2020   Epistaxis 06/15/2020   Erectile dysfunction 10/31/2017   Adjustment disorder with depressed mood 03/25/2017   Glucose intolerance 02/25/2017   Physical exam 12/03/2014   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 fascicular block 01/18/2014   Hx of pulmonary embolus 01/13/2014   Soft tissue mass 01/13/2014   Overweight (BMI 25.0-29.9) 01/13/2014   Allergic rhinitis 01/13/2014    Past Medical History:  Diagnosis Date   Abnormal EKG    Ankle fracture, left    Peripheral vascular disease (HCC) 2013   dvt-pe after ankle fx   Pulmonary embolism (HCC) 05/2012   Seizure disorder (HCC) 11/07/2020    Family History  Problem Relation Age of Onset   Diabetes Mother    Hypertension Mother    Heart murmur Mother    Stroke Mother    Diabetes Father    Hypertension Father    ALS Father    Stroke Sister    Rheum arthritis Brother    Diabetes Paternal Grandmother    Diabetes Paternal Grandfather    Past Surgical History:  Procedure Laterality Date   I & D EXTREMITY Right 06/07/2016   Procedure: IRRIGATION AND DEBRIDEMENT RIGHT WRIST,MEDIAN NERVE EXPLORATION NEUROLYSIS AND REPAIR;  Surgeon: William Gramig, MD;  Location: [facility] OR;  Service: Orthopedics;  Laterality: Right;   medial nerve surgry right arm     NO PAST SURGERIES     Social History   Social History Narrative   Not on file   Immunization History  Administered Date(s) Administered   Influenza,inj,Quad PF,6+ Mos 09/01/2018   Moderna SARS-COV2 Booster Vaccination 11/05/2020   Tdap 10/08/2004, 12/03/2014     Objective: Vital Signs: BP 108/71 (BP Location: Right Arm, Patient Position: Sitting, Cuff Size: Normal)   Pulse (!) 114   Resp 15   Ht 6' 1" (1.854 m)   Wt 205 lb 3.2 oz (93.1 kg)   BMI 27.07 kg/m    Physical  Exam Vitals and nursing note reviewed.  Constitutional:      Appearance: He is well-developed.  HENT:     Head: Normocephalic and atraumatic.  Eyes:     Conjunctiva/sclera: Conjunctivae normal.     Pupils: Pupils are equal, round, and reactive to light.  Cardiovascular:     Rate and Rhythm: Normal rate and regular rhythm.     Heart sounds: Normal heart sounds.  Pulmonary:     Effort: Pulmonary effort is normal.     Breath sounds: Normal breath sounds.  Abdominal:     General: Bowel sounds are normal.     Palpations: Abdomen is soft.  Musculoskeletal:     Cervical back: Normal range of motion and neck supple.  Skin:    General: Skin is warm and dry.     Capillary Refill: Capillary refill takes less than 2 seconds.  Neurological:       Mental Status: He is alert and oriented to person, place, and time.     Deep Tendon Reflexes: Reflexes abnormal.     Comments: Strength in upper extremities 3/5 strength in lower extremities 3/5.  Weakness in the neck flexors.  Psychiatric:        Behavior: Behavior normal.      Musculoskeletal Exam: He had limited lateral rotation of his cervical spine.  He had limited flexion and extension of the cervical spine.  Shoulder abduction was limited to 30 degrees.  Elbow joints and wrist joints with good range of motion.  He had tenderness over PIPs and MCPs.  Synovitis was noted over MCPs and PIPs.  Hip joints and knee joints with good range of motion.  There was no tenderness over ankles or MTPs.  CDAI Exam: CDAI Score: -- Patient Global: --; Provider Global: -- Swollen: --; Tender: -- Joint Exam 05/31/2022   No joint exam has been documented for this visit   There is currently no information documented on the homunculus. Go to the Rheumatology activity and complete the homunculus joint exam.  Investigation: No additional findings.  Imaging: CT SHOULDER RIGHT WO CONTRAST  Result Date: 05/16/2022 CLINICAL DATA:  Right shoulder pain, evaluate  prior to arthroplasty EXAM: CT OF THE UPPER RIGHT EXTREMITY WITHOUT CONTRAST TECHNIQUE: Multidetector CT imaging of the upper right extremity was performed according to the standard protocol. RADIATION DOSE REDUCTION: This exam was performed according to the departmental dose-optimization program which includes automated exposure control, adjustment of the mA and/or kV according to patient size and/or use of iterative reconstruction technique. COMPARISON:  None Available. FINDINGS: Bones/Joint/Cartilage No fracture or dislocation. Normal alignment. No joint effusion. Moderate osteoarthritis of the right glenohumeral joint. Mild arthropathy of the acromioclavicular joint. Ligaments Ligaments are suboptimally evaluated by CT. Muscles and Tendons Muscles are normal. No muscle atrophy. Complete tear of the supraspinatus and infraspinatus tendons. Soft tissue No fluid collection or hematoma. No soft tissue mass. Visualized portions of the lung are clear. IMPRESSION: 1. Complete tear of the supraspinatus and infraspinatus tendons. 2. Moderate osteoarthritis of the right glenohumeral joint. Electronically Signed   By: Kathreen Devoid M.D.   On: 05/16/2022 11:33    Recent Labs: Lab Results  Component Value Date   WBC 17.6 (H) 05/25/2022   HGB 12.0 (L) 05/25/2022   PLT 455 (H) 05/25/2022   NA 137 05/25/2022   K 4.0 05/25/2022   CL 103 05/25/2022   CO2 29 05/25/2022   GLUCOSE 102 (H) 05/25/2022   BUN 23 (H) 05/25/2022   CREATININE 0.69 05/25/2022   BILITOT 0.4 05/25/2022   ALKPHOS 60 05/25/2022   AST 92 (H) 05/25/2022   ALT 31 05/25/2022   PROT 7.2 05/25/2022   ALBUMIN 2.4 (L) 05/25/2022   CALCIUM 8.1 (L) 05/25/2022   GFRAA >60 06/06/2016   May 11, 2022 anticardiolipin IgM 18, ANA 1: 1280 cytoplasmic, ESR 100, TSH normal, HIV negative   Speciality Comments: No specialty comments available.  Procedures:  No procedures performed Allergies: Patient has no known allergies.   Assessment / Plan:      Visit Diagnoses: Polymyositis ([place]) -patient presents with several months of muscle weakness difficulty getting up from the chair and the bed.  He has been needing assistance from his wife to perform ADLs.  He gives history of dysphagia, dysphonia and dysarthria.  He also gives history of joint pain and joint stiffness.  He had synovitis over MCPs and PIPs.  He has weakness of the  neck flexor muscles and muscle wasting was noted.  He was unable to get up from the chair without assistance.  He was unable to get up from the bed without assistance.  Muscle strength was 3/5 in all 4 extremities.  His clinical findings are consistent with polymyositis.  Detailed counsel regarding polymyositis was provided.  Increase association of polymyositis with cancer was also discussed.  Patient gives history of significant weight loss over the last several months.  His sedimentation rate is elevated.  I will obtain CT chest, CT abdomen and pelvis with contrast to rule out malignancy.  I will also schedule muscle biopsy.  I will place him on prednisone 40 mg p.o. twice daily.  I will obtain labs today.  Plan: CK, Aldolase, 3-Hydroxy-3-Methylglutaryl-Coenzyme A Reductase (HMGCR) AB (IgG), Myositis Specific II Antibodies Panel, he may need more aggressive therapy.  I will refer him to a tertiary care center.  Ambulatory referral to Rheumatology, Ambulatory referral to General Surgery for muscle biopsy, CT CHEST ABDOMEN PELVIS W CONTRAST  Dysarthria-he has difficulty speaking for the last several months most likely due to polymyositis.  Other dysphagia -he has been suffering from dysphagia for last several months and has had 60 pounds weight loss over the last 9 months per patient.   Pain in both hands -he complains of pain and discomfort in his bilateral hands.  Synovitis over MCPs and PIPs was noted.  Plan: XR Hand 2 View Right, XR Hand 2 View Left, x-rays of bilateral hands were consistent with osteoarthritis.  Rheumatoid  factor, Cyclic citrul peptide antibody, IgG, Urinalysis, Routine w reflex microscopic  Positive ANA (antinuclear antibody) -he had positive ANA.  I will obtain additional labs.  He also gives history of Raynaud's phenomenon.  Plan: ANA, Anti-scleroderma antibody, RNP Antibody, Anti-Smith antibody, Sjogrens syndrome-A extractable nuclear antibody, Sjogrens syndrome-B extractable nuclear antibody, Anti-DNA antibody, double-stranded, C3 and C4, Beta-2 glycoprotein antibodies, Cardiolipin antibodies, IgG, IgM, IgA, Lupus Anticoagulant Eval w/Reflex, he will need more aggressive therapy.  I will refer him to a tertiary care center.  Ambulatory referral to Rheumatology  Anticardiolipin antibody positive -anticardiolipin antibodies were low titer.    Elevated sed rate -he has elevated sedimentation.  Malignancy has to be ruled out.  CT CHEST ABDOMEN PELVIS W CONTRAST  Chronic pain of both shoulders-he is at abduction of his bilateral arms most likely due to polymyositis.  He was also evaluated by Dr. Marlou Sa for shoulder joint rotator cuff tear.  High risk medication use -in anticipation to start him on immunosuppressive therapy I will obtain following labs today.  Plan: Hepatitis B core antibody, IgM, Hepatitis B surface antigen, Hepatitis C antibody, QuantiFERON-TB Gold Plus, Serum protein electrophoresis with reflex, IgG, IgA, IgM, Thiopurine methyltransferase(tpmt)rbc.  HIV was negative on May 11, 2022.  History of DVT (deep vein thrombosis) - DVT and PE 2013, DVT July 22, work-up negative by hematology.  Patient is on Eliquis 5 mg twice daily.  Hx of pulmonary embolus  Leukocytosis/thrombocytosis - Followed by hematology.  History of non-ST elevation myocardial infarction (NSTEMI)-I reviewed records from Dr. Alvester Chou from May 09, 2022.  He had recent echocardiogram which was unremarkable.  Pericardial effusion-patient states that he was told that he had mild pericardial effusion which was not of  significance.  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 fascicular block  Lower extremity edema-mild with edema was noted.    Seizure disorder (HCC)-he gives history of 2 episodes of seizures several years ago with no recurrence.  He is  not on anti-epileptic.  Orders: Orders Placed This Encounter  Procedures   XR Hand 2 View Right   XR Hand 2 View Left   CT CHEST ABDOMEN PELVIS W CONTRAST   CK   Rheumatoid factor   Cyclic citrul peptide antibody, IgG   ANA   Anti-scleroderma antibody   RNP Antibody   Anti-Smith antibody   Sjogrens syndrome-A extractable nuclear antibody   Sjogrens syndrome-B extractable nuclear antibody   Anti-DNA antibody, double-stranded   C3 and C4   Beta-2 glycoprotein antibodies   Cardiolipin antibodies, IgG, IgM, IgA   Lupus Anticoagulant Eval w/Reflex   Hepatitis B core antibody, IgM   Hepatitis B surface antigen   Hepatitis C antibody   QuantiFERON-TB Gold Plus   Serum protein electrophoresis with reflex   IgG, IgA, IgM   Thiopurine methyltransferase(tpmt)rbc   Aldolase   3-Hydroxy-3-Methylglutaryl-Coenzyme A Reductase (HMGCR) AB (IgG)   Myositis Specific II Antibodies Panel   Urinalysis, Routine w reflex microscopic   Ambulatory referral to Rheumatology   Ambulatory referral to General Surgery   Meds ordered this encounter  Medications   predniSONE (DELTASONE) 20 MG tablet    Sig: Take 2 tablets (40 mg total) by mouth 2 (two) times daily with a meal.    Dispense:  120 tablet    Refill:  0    Face-to-face time spent with patient was 60 minutes. Greater than 50% of time was spent in counseling and coordination of care.  Follow-Up Instructions: Return in about 2 weeks (around 06/14/2022) for polmyositis.   Bo Merino, MD  Note - This record has been created using Editor, commissioning.  Chart creation errors have been sought, but may not always  have been located. Such creation errors do not reflect on  the standard of medical  care.

## 2022-06-01 NOTE — Progress Notes (Signed)
I called patient to notify him of his lab results.  CK value was discussed.  He stated that he took his first dose of prednisone this morning.

## 2022-06-05 ENCOUNTER — Other Ambulatory Visit: Payer: Self-pay | Admitting: *Deleted

## 2022-06-05 DIAGNOSIS — Z79899 Other long term (current) drug therapy: Secondary | ICD-10-CM

## 2022-06-05 DIAGNOSIS — R700 Elevated erythrocyte sedimentation rate: Secondary | ICD-10-CM

## 2022-06-05 DIAGNOSIS — R768 Other specified abnormal immunological findings in serum: Secondary | ICD-10-CM

## 2022-06-05 DIAGNOSIS — R760 Raised antibody titer: Secondary | ICD-10-CM

## 2022-06-05 NOTE — Progress Notes (Signed)
TB gold is indeterminate.  Please have patient repeat TB Gold in a week.  If TB gold is still indeterminate we will need PPD.  Jo 1 antibody positive consistent with myositis.  I will discuss trust the results at the follow-up visit.

## 2022-06-06 ENCOUNTER — Telehealth: Payer: Self-pay | Admitting: *Deleted

## 2022-06-06 NOTE — Telephone Encounter (Signed)
There is not can be a sudden improvement on prednisone.  He had muscle weakness and loss of muscle mass over several months.  He still needs further work-up before any medications can be started.  Fluid retention is a side effect of prednisone.  He should decrease salt intake.  Please have patient come in for CK and BMP.  If his CK improves we can reduce the dose of prednisone.

## 2022-06-06 NOTE — Telephone Encounter (Signed)
Patient advised he still needs further work-up before any medications can be started.  Fluid retention is a side effect of prednisone.  He should decrease salt intake.  Patient advised to come in for CK and BMP.  If his CK improves we can reduce the dose of prednisone. Patient states he is okay at this time and will call back if it gets worse.

## 2022-06-06 NOTE — Telephone Encounter (Signed)
Patient states he is taking the prednisone and has noticed an improvement in how he feels overall. Patient states he has noticed some increased swelling in bilateral legs from the knees down. Patient states he has also noticed an increase in bowel movements. Please advise.

## 2022-06-07 LAB — HMGCR AB (IGG): HMGCR AB (IGG): <2 CU (ref <20)

## 2022-06-07 LAB — URINALYSIS, ROUTINE W REFLEX MICROSCOPIC
Bacteria, UA: NONE SEEN /HPF
Bilirubin Urine: NEGATIVE
Glucose, UA: NEGATIVE
Hgb urine dipstick: NEGATIVE
Hyaline Cast: NONE SEEN /LPF
Ketones, ur: NEGATIVE
Leukocytes,Ua: NEGATIVE
Nitrite: NEGATIVE
RBC / HPF: NONE SEEN /HPF (ref 0–2)
Specific Gravity, Urine: 1.024 (ref 1.001–1.035)
Squamous Epithelial / HPF: NONE SEEN /HPF (ref <=5)
WBC, UA: NONE SEEN /HPF (ref 0–5)
pH: 5.5 (ref 5.0–8.0)

## 2022-06-07 LAB — PROTEIN ELECTROPHORESIS, SERUM, WITH REFLEX
Albumin ELP: 2.8 g/dL — ABNORMAL LOW (ref 3.8–4.8)
Alpha 1: 0.4 g/dL — ABNORMAL HIGH (ref 0.2–0.3)
Alpha 2: 0.9 g/dL (ref 0.5–0.9)
Beta 2: 0.6 g/dL — ABNORMAL HIGH (ref 0.2–0.5)
Beta Globulin: 0.4 g/dL (ref 0.4–0.6)
Gamma Globulin: 2.6 g/dL — ABNORMAL HIGH (ref 0.8–1.7)
Total Protein: 7.8 g/dL (ref 6.1–8.1)

## 2022-06-07 LAB — CYCLIC CITRUL PEPTIDE ANTIBODY, IGG: Cyclic Citrullin Peptide Ab: 56 UNITS — ABNORMAL HIGH

## 2022-06-07 LAB — MYOSITIS SPECIFIC II ANTIBODIES PANEL
EJ AB: <11 SI (ref <11)
JO-1 AB: 154 SI — ABNORMAL HIGH (ref <11)
MDA-5 AB: <11 SI (ref <11)
MI-2 ALPHA AB: <11 SI (ref <11)
MI-2 BETA AB: <11 SI (ref <11)
NXP-2 AB: <11 SI (ref <11)
OJ AB: <11 SI (ref <11)
PL-12 AB: <11 SI (ref <11)
PL-7 AB: <11 SI (ref <11)
SRP-AB: <11 SI (ref <11)
TIF-1y AB: <11 SI (ref <11)

## 2022-06-07 LAB — RNP ANTIBODY: Ribonucleic Protein(ENA) Antibody, IgG: <1 AI

## 2022-06-07 LAB — CARDIOLIPIN ANTIBODIES, IGG, IGM, IGA
Anticardiolipin IgA: <2 APL-U/mL (ref <20.0)
Anticardiolipin IgG: <2 GPL-U/mL (ref <20.0)
Anticardiolipin IgM: <2 MPL-U/mL (ref <20.0)

## 2022-06-07 LAB — ANTI-SCLERODERMA ANTIBODY: Scleroderma (Scl-70) (ENA) Antibody, IgG: <1 AI

## 2022-06-07 LAB — BETA-2 GLYCOPROTEIN ANTIBODIES
Beta-2 Glyco 1 IgA: <2 U/mL (ref <20.0)
Beta-2 Glyco 1 IgM: <2 U/mL (ref <20.0)
Beta-2 Glyco I IgG: <2 U/mL (ref <20.0)

## 2022-06-07 LAB — SJOGRENS SYNDROME-B EXTRACTABLE NUCLEAR ANTIBODY: SSB (La) (ENA) Antibody, IgG: <1 AI

## 2022-06-07 LAB — LUPUS ANTICOAGULANT EVAL W/ REFLEX

## 2022-06-07 LAB — HEPATITIS B SURFACE ANTIGEN: Hepatitis B Surface Ag: NONREACTIVE

## 2022-06-07 LAB — IGG, IGA, IGM
IgG (Immunoglobin G), Serum: 3162 mg/dL — ABNORMAL HIGH (ref 600–1640)
IgM, Serum: 134 mg/dL (ref 50–300)
Immunoglobulin A: 602 mg/dL — ABNORMAL HIGH (ref 47–310)

## 2022-06-07 LAB — ANTI-NUCLEAR AB-TITER (ANA TITER): ANA Titer 1: 1:1280 {titer} — ABNORMAL HIGH

## 2022-06-07 LAB — THIOPURINE METHYLTRANSFERASE (TPMT), RBC: Thiopurine Methyltransferase, RBC: 6 nmol/hr/mL RBC — ABNORMAL LOW

## 2022-06-07 LAB — ANTI-SMITH ANTIBODY: ENA SM Ab Ser-aCnc: <1 AI

## 2022-06-07 LAB — QUANTIFERON-TB GOLD PLUS
Mitogen-NIL: 0 IU/mL
NIL: 0.04 IU/mL
QuantiFERON-TB Gold Plus: UNDETERMINED — AB
TB1-NIL: 0 IU/mL
TB2-NIL: 0 IU/mL

## 2022-06-07 LAB — C3 AND C4
C3 Complement: 150 mg/dL (ref 82–185)
C4 Complement: 23 mg/dL (ref 15–53)

## 2022-06-07 LAB — HEPATITIS C ANTIBODY: Hepatitis C Ab: NONREACTIVE

## 2022-06-07 LAB — ALDOLASE: Aldolase: 41.5 U/L — ABNORMAL HIGH (ref <=8.1)

## 2022-06-07 LAB — SJOGRENS SYNDROME-A EXTRACTABLE NUCLEAR ANTIBODY: SSA (Ro) (ENA) Antibody, IgG: >8 AI — AB

## 2022-06-07 LAB — RHEUMATOID FACTOR: Rheumatoid fact SerPl-aCnc: 35 IU/mL — ABNORMAL HIGH (ref <14)

## 2022-06-07 LAB — ANA: Anti Nuclear Antibody (ANA): POSITIVE — AB

## 2022-06-07 LAB — HEPATITIS B CORE ANTIBODY, IGM: Hep B C IgM: NONREACTIVE

## 2022-06-07 LAB — ANTI-DNA ANTIBODY, DOUBLE-STRANDED: ds DNA Ab: <1 IU/mL

## 2022-06-08 ENCOUNTER — Ambulatory Visit (HOSPITAL_COMMUNITY)
Admission: RE | Admit: 2022-06-08 | Discharge: 2022-06-08 | Disposition: A | Discharge status: Home / Self Care | Payer: BC Managed Care – PPO | Source: Ambulatory Visit | Attending: Rheumatology | Admitting: Rheumatology

## 2022-06-08 DIAGNOSIS — M3320 Polymyositis, organ involvement unspecified: Secondary | ICD-10-CM | POA: Insufficient documentation

## 2022-06-08 DIAGNOSIS — R700 Elevated erythrocyte sedimentation rate: Secondary | ICD-10-CM | POA: Insufficient documentation

## 2022-06-08 DIAGNOSIS — J479 Bronchiectasis, uncomplicated: Secondary | ICD-10-CM | POA: Diagnosis not present

## 2022-06-08 DIAGNOSIS — R918 Other nonspecific abnormal finding of lung field: Secondary | ICD-10-CM | POA: Diagnosis not present

## 2022-06-08 DIAGNOSIS — N3289 Other specified disorders of bladder: Secondary | ICD-10-CM | POA: Diagnosis not present

## 2022-06-08 DIAGNOSIS — K760 Fatty (change of) liver, not elsewhere classified: Secondary | ICD-10-CM | POA: Diagnosis not present

## 2022-06-08 DIAGNOSIS — K6389 Other specified diseases of intestine: Secondary | ICD-10-CM | POA: Diagnosis not present

## 2022-06-08 MED ORDER — IOHEXOL 9 MG/ML PO SOLN: 1000.0000 mL | ORAL | Status: AC

## 2022-06-08 MED ORDER — IOHEXOL 300 MG/ML  SOLN
100.0000 mL | Freq: Once | INTRAMUSCULAR | Status: AC | PRN
  Administered 2022-06-08: 100 mL via INTRAVENOUS

## 2022-06-08 MED ORDER — IOHEXOL 9 MG/ML PO SOLN
ORAL | Status: AC
  Filled 2022-06-08: qty 1000

## 2022-06-08 NOTE — Progress Notes (Signed)
Office Visit Note  Patient: Cameron Kim             Date of Birth: [birth year]           MRN: [id number]             PCP: Sheliah Hatch, MD Referring: Sheliah Hatch, MD Visit Date: 06/19/2022 Occupation: @GUAROCC @  Subjective:  Medication management  History of Present Illness: Cameron Kim is a 51 y.o. male with Jo 1 antibody positive inflammatory myopathy.  He was placed on prednisone 40 mg twice daily on June 01, 2022.  He is gradually noticed improvement in his symptoms.  He states that that he has become completely independent now.  He is able to shower and do routine activities.  He has been elevating around inside the house.  He believes he still has some difficulty swallowing food and liquids.  He still notices nasal tone in his voice.  He has some stiffness in his hands but he has not noticed any joint swelling.  He states he is organized his wife's positive completely and has been working in the yard.  He is also helping with the chores in the house.  He missed his appointment with the surgeon for the muscle biopsy.  He has an appointment with the surgeon tomorrow.  He also missed an appointment with the gastroenterologist.  He has another appointment scheduled.  He has an appointment with [facility] rheumatology on June 29, 2021.  Activities of Daily Living:  Patient reports morning stiffness for a few minutes.   Patient Denies nocturnal pain.  Difficulty dressing/grooming: Denies Difficulty climbing stairs: Reports Difficulty getting out of chair: Reports Difficulty using hands for taps, buttons, cutlery, and/or writing: Denies  Review of Systems  Constitutional:  Positive for fatigue.  HENT:  Positive for mouth dryness. Negative for mouth sores.   Eyes:  Negative for dryness.  Respiratory:  Negative for shortness of breath.   Cardiovascular:  Negative for chest pain and palpitations.  Gastrointestinal:  Negative for blood in stool, constipation and diarrhea.   Endocrine: Negative for increased urination.  Genitourinary:  Negative for involuntary urination.  Musculoskeletal:  Positive for morning stiffness and muscle tenderness. Negative for joint pain, gait problem, joint pain, joint swelling, myalgias, muscle weakness and myalgias.  Skin:  Negative for color change, rash, hair loss and sensitivity to sunlight.  Allergic/Immunologic: Negative for susceptible to infections.  Neurological:  Negative for dizziness and headaches.  Hematological:  Negative for swollen glands.  Psychiatric/Behavioral:  Positive for sleep disturbance. Negative for depressed mood. The patient is not nervous/anxious.     PMFS History:  Patient Active Problem List   Diagnosis Date Noted   History of DVT (deep vein thrombosis) 05/28/2022   Low serum albumin 05/13/2022   Pericardial effusion 02/28/2022   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   Unintentional weight loss 02/11/2022   Lower extremity edema 02/11/2022   Dysarthria 02/11/2022   Weakness generalized 02/11/2022   Chronic pain of both shoulders 02/11/2022   Dry skin 02/11/2022   History of non-ST elevation myocardial infarction (NSTEMI) 02/11/2022   Acute deep vein thrombosis (DVT) of lower extremity (HCC) 05/02/2021   Seizure disorder (HCC) 11/07/2020   Epistaxis 06/15/2020   Erectile dysfunction 10/31/2017   Adjustment disorder with depressed mood 03/25/2017   Glucose intolerance 02/25/2017   Physical exam 12/03/2014   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 fascicular block 01/18/2014   Hx of pulmonary embolus 01/13/2014   Soft tissue mass 01/13/2014  Overweight (BMI 25.0-29.9) 01/13/2014   Allergic rhinitis 01/13/2014    Past Medical History:  Diagnosis Date   Abnormal EKG    Ankle fracture, left    Peripheral vascular disease (HCC) 2013   dvt-pe after ankle fx   Pulmonary embolism (HCC) 05/2012   Seizure disorder (HCC) 11/07/2020    Family History  Problem Relation Age of Onset   Diabetes Mother     Hypertension Mother    Heart murmur Mother    Stroke Mother    Diabetes Father    Hypertension Father    ALS Father    Stroke Sister    Rheum arthritis Brother    Diabetes Paternal Grandmother    Diabetes Paternal Grandfather    Past Surgical History:  Procedure Laterality Date   I & D EXTREMITY Right 06/07/2016   Procedure: IRRIGATION AND DEBRIDEMENT RIGHT WRIST,MEDIAN NERVE EXPLORATION NEUROLYSIS AND REPAIR;  Surgeon: Dominica Severin, MD;  Location: [facility] OR;  Service: Orthopedics;  Laterality: Right;   medial nerve surgry right arm     NO PAST SURGERIES     Social History   Social History Narrative   Not on file   Immunization History  Administered Date(s) Administered   Influenza,inj,Quad PF,6+ Mos 09/01/2018   Moderna SARS-COV2 Booster Vaccination 11/05/2020   Tdap 10/08/2004, 12/03/2014     Objective: Vital Signs: BP (!) 143/83 (BP Location: Left Arm, Patient Position: Sitting, Cuff Size: Normal)   Pulse 99   Resp 18   Ht 6\' 2"  (1.88 m)   Wt 210 lb 6.4 oz (95.4 kg)   BMI 27.01 kg/m    Physical Exam Vitals and nursing note reviewed.  Constitutional:      Appearance: He is well-developed.  HENT:     Head: Normocephalic and atraumatic.  Eyes:     Conjunctiva/sclera: Conjunctivae normal.     Pupils: Pupils are equal, round, and reactive to light.  Cardiovascular:     Rate and Rhythm: Normal rate and regular rhythm.     Heart sounds: Normal heart sounds.  Pulmonary:     Effort: Pulmonary effort is normal.     Breath sounds: Normal breath sounds.  Abdominal:     General: Bowel sounds are normal.     Palpations: Abdomen is soft.  Musculoskeletal:     Cervical back: Normal range of motion and neck supple.     Right lower leg: Edema present.     Left lower leg: Edema present.  Skin:    General: Skin is warm and dry.     Capillary Refill: Capillary refill takes less than 2 seconds.  Neurological:     Mental Status: He is alert and oriented to person, place,  and time.     Comments: muscle strength was 4+/5 in upper and lower extremities.  Neck flexors were 4/5.  Psychiatric:        Behavior: Behavior normal.      Musculoskeletal Exam: He had some limitation with lateral rotation of the cervical spine.  He had good flexion and extension of the cervical spine.  Right shoulder joint abduction was limited to 30 degrees.  Left shoulder joint abduction was limited to 90 degrees.  Elbow joints and wrist joints in good range of motion.  He had no synovitis over MCPs or PIPs.  Hip joints and knee joints were in good range of motion.  There was no tenderness over ankles or MTPs.  CDAI Exam: CDAI Score: -- Patient Global: --; Provider Global: -- Swollen: --;  Tender: -- Joint Exam 06/19/2022   No joint exam has been documented for this visit   There is currently no information documented on the homunculus. Go to the Rheumatology activity and complete the homunculus joint exam.  Investigation: No additional findings.  Imaging: CT CHEST ABDOMEN PELVIS W CONTRAST  Result Date: 06/10/2022 CLINICAL DATA:  Polymyositis.  Elevated sed rate. EXAM: CT CHEST, ABDOMEN, AND PELVIS WITH CONTRAST TECHNIQUE: Multidetector CT imaging of the chest, abdomen and pelvis was performed following the standard protocol during bolus administration of intravenous contrast. RADIATION DOSE REDUCTION: This exam was performed according to the departmental dose-optimization program which includes automated exposure control, adjustment of the mA and/or kV according to patient size and/or use of iterative reconstruction technique. CONTRAST:  OMNIPAQUE IOHEXOL 300 MG/ML  SOLN COMPARISON:  None Available. FINDINGS: CT CHEST FINDINGS Cardiovascular: The thoracic aorta is normal in caliber, no acute findings. Normal heart size. There are coronary artery calcifications. No filling defects in the central most pulmonary arteries to suggest pulmonary embolus. No pericardial effusion.  Mediastinum/Nodes: No mediastinal or hilar adenopathy. No mediastinal mass. No enlarged axillary lymph nodes. No thyroid nodule. No esophageal wall thickening. Lungs/Pleura: There is mild bilateral lower lobe bronchiolectasis with basilar/lower lobe ground-glass opacity. No confluent airspace disease. No suspicious pulmonary nodule. Trachea and central bronchi are patent. Slight subpleural reticulation in the periphery of the lower lobes. Trace left pleural effusion Musculoskeletal: Mild thoracic spondylosis with anterior spurring. No focal bone lesion or erosive changes. There is bilateral gynecomastia. Normal CT appearance of thoracic musculature. CT ABDOMEN PELVIS FINDINGS Hepatobiliary: Mild subjective hepatic steatosis with decreased hepatic density. There is a 9 mm hypodensity in the medial right hepatic lobe series 2, image 57, too small to characterize but likely small cyst. No specific imaging follow-up is needed. Gallbladder physiologically distended, no calcified stone. No biliary dilatation. Pancreas: No evidence of pancreatic mass. No ductal dilatation or inflammation. Spleen: Normal in size without focal abnormality. Adrenals/Urinary Tract: No adrenal nodule. No hydronephrosis or perinephric edema. Homogeneous renal enhancement with symmetric excretion on delayed phase imaging. No renal calculi or focal renal abnormality. Urinary bladder is only minimally distended, not well assessed due to degree of distension. Stomach/Bowel: Physiologically distended stomach. There are few prominent air-filled loops of small bowel in the central and left abdomen, but no obstruction with enteric contrast reaching distally. There is no wall thickening or inflammatory change. Normal appendix visualized. Small volume of stool in the colon. Minimal distal descending and sigmoid colonic diverticulosis, no diverticulitis. Vascular/Lymphatic: Normal caliber abdominal aorta patent portal vein. No acute vascular findings.  Scattered mildly enlarged bilateral external iliac nodes, 16 mm on the right and 12 mm on the left. No other adenopathy. Reproductive: Prostate is unremarkable. Other: There is trace free fluid in the pelvis, which is nonspecific in a male patient. Small fat containing right inguinal hernia. Musculoskeletal: L5-S1 degenerative disc disease. Mild degenerative change of both hips. No focal bone lesion or erosive change. Unremarkable CT appearance of the abdominopelvic musculature. Slight subcutaneous edema lateral to both greater trochanters, right greater than left. IMPRESSION: 1. Mildly enlarged bilateral external iliac nodes, nonspecific. Recommend correlation with PSA. 2. Mild bilateral lower lobe bronchiolectasis with basilar/lower lobe ground-glass opacity. Occasional subpleural reticulation in the lower lobes. Findings may be due to interstitial lung disease, consider follow-up high-resolution chest CT in 3-6 months to assess for interval change. Trace left pleural effusion. 3. Mild hepatic steatosis. 4. Minimal distal colonic diverticulosis without diverticulitis. 5. Trace free fluid in  the pelvis, which is nonspecific in a male patient. 6. Small fat containing right inguinal hernia. Electronically Signed   By: Keith Rake M.D.   On: 06/10/2022 22:21   XR Hand 2 View Left  Result Date: 05/31/2022 CMC, PIP and DIP narrowing was noted.  No MCP, intercarpal or radiocarpal joint space narrowing was noted.  No erosive changes were noted. Impression: These findings are consistent with osteoarthritis of the hand.  XR Hand 2 View Right  Result Date: 05/31/2022 CMC, PIP and DIP narrowing was noted.  No MCP, intercarpal or radiocarpal joint space narrowing was noted.  No erosive changes were noted. Impression: These findings are consistent with osteoarthritis of the hand.   Recent Labs: Lab Results  Component Value Date   WBC 17.6 (H) 05/25/2022   HGB 12.0 (L) 05/25/2022   PLT 455 (H) 05/25/2022    NA 137 05/25/2022   K 4.0 05/25/2022   CL 103 05/25/2022   CO2 29 05/25/2022   GLUCOSE 102 (H) 05/25/2022   BUN 23 (H) 05/25/2022   CREATININE 0.69 05/25/2022   BILITOT 0.4 05/25/2022   ALKPHOS 60 05/25/2022   AST 92 (H) 05/25/2022   ALT 31 05/25/2022   PROT 7.8 05/31/2022   ALBUMIN 2.4 (L) 05/25/2022   CALCIUM 8.1 (L) 05/25/2022   GFRAA >60 06/06/2016   QFTBGOLDPLUS INDETERMINATE (A) 06/15/2022   May 31, 2022 ANA 1: 1280 cytoplasmic and speckled, anti-Ro> 8.0, (SCL 70, RNP, Smith, SSB, dsDNA, beta-2 GP 1, anticardiolipin) negative, C3-C4 normal, RF 35, anti-CCP 56, CK 3806, aldolase 41.5, myositis panel Jo-1 Ab 154, HMG CR antibody negative, TPMT 6 (low metabolizer) hepatitis B-, hepatitis C negative, SPEP normal, IgG 3162, IgA 602 ,UA trace protein  Speciality Comments: No specialty comments available.  Procedures:  No procedures performed Allergies: Patient has no known allergies.   Assessment / Plan:     Visit Diagnoses: Idiopathic inflammatory myopathy-Jo-1 Ab +, Ro Ab + - elevated CK, aldolase, positive ANA, positive RF, positive anti-CCP, severe muscle weakness with dysphagia, dysphonia and dysarthria. -Patient was placed on prednisone 40 mg twice daily on June 01, 2022 which she has been taking on a regular basis.  He has noticed remarkable improvement in his muscle weakness and tenderness.  He is able to perform regular ADLs and has been helping with the chores inside the house.  His muscle strength was 4+/5 today.  I plan to check CK today.  If his CK is normal I will reduce the dose of prednisone to 40 mg in the a.m. and 20 mg in the p.m.  He will stay on total 60 mg a day for another 4 weeks.  My plan is to taper prednisone by 10 mg every week until he reaches 40 mg p.o. daily.  And then taper by 5 mg every week until he reaches 20 mg p.o. daily.  Then taper by 2.5 mg weekly until he reaches 10 mg p.o. daily.  And then by 1 mg p.o. every 2 weeks as tolerated.  My plan is to  also add Imuran or CellCept based on his pulmonary evaluation.  He has TPMT deficiency.  If he start him on Imuran then the dose will be 50 mg daily for 2 weeks and if the labs are stable then we will increase it to 150 mg p.o. daily as tolerated.  He will need Bactrim DS 1 tablet p.o. 3 times a week for PCP prophylaxis.  Side effects of Bactrim were discussed with the patient.  Plan: CK.  Patient is scheduled with the surgery tomorrow for muscle biopsy.  He had malignancy work-up-had CT chest, CT abdomen and pelvis findings were discussed with the patient.  It showed mildly enlarged bilateral external iliac lymph nodes.  PSA was normal in July.  Patient will have prostate and testicular exam through Dr. Virgil Benedict office.  CT chest showed bilateral lower lobe bronchiectasis and lower lobe groundglass opacity.  He has an appointment coming up with Dr. Chase Caller for the evaluation of ILD.  Mild fatty liver was noted.  We will schedule total body bone scan.  Patient was referred to gastroenterology for endoscopy and colonoscopy.  Long term (current) use of systemic steroids - started on prednisone 40 mg p.o. bid at the last visit.  Plan is to taper prednisone to 60 mg p.o. qd for 4.-As patient is on prednisone I will check BMP today.  Plan: BASIC METABOLIC PANEL WITH GFR  TPMT intermediate metabolizer (HCC)-TPMT was 6, low metabolizer  High risk medication use -I plan to discuss Imuran and CellCept at the follow-up visit.  Patient will be evaluated by Dr. Chase Caller to rule out ILD.  Other dysphagia - Plan speech therapy consultation, advised semi-thick diet and elevation of head for aspiration prevention.  Chronic pain of both shoulders-patient had rotator cuff tear and has limited abduction of his right shoulder up to 30 degrees.  He also had a left shoulder injury and has limited range of motion.  Some of the exercises were demonstrated in the office today.  Pain in both hands-he has some stiffness now  but the synovitis has resolved.  History of DVT (deep vein thrombosis) -anticardiolipin antibody and beta-2 GP 1 were negative.  He has positive ANA and positive Ro antibody.  Plan: Lupus Anticoagulant Eval w/Reflex  Hx of pulmonary embolus - Plan: Lupus Anticoagulant Eval w/Reflex  Leukocytosis/thrombocytosis-he has longstanding history of leukocytosis and thrombocytosis.  History of non-ST elevation myocardial infarction (NSTEMI)-he is on Eliquis.  Pericardial effusion  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 fascicular block  Lower extremity edema  Seizure disorder (HCC)  Steroid-induced osteoporosis -I plan to discuss alendronate 70 mg p.o. weekly for prevention of steroid-induced osteoporosis the follow-up visit as patient will be on long-term high-dose steroids.  Use of calcium and vitami  Positive ANA (antinuclear antibody) - Plan: Lupus Anticoagulant Eval w/Reflex  Orders: Orders Placed This Encounter  Procedures   NM Bone Scan Whole Body   Lupus Anticoagulant Eval w/Reflex   CK   BASIC METABOLIC PANEL WITH GFR   Ambulatory referral to Speech Therapy   Ambulatory referral to Gastroenterology   Meds ordered this encounter  Medications   sulfamethoxazole-trimethoprim (BACTRIM DS) 800-160 MG tablet    Sig: Take 1 tablet by mouth 3 (three) times a week.    Dispense:  12 tablet    Refill:  2    Face-to-face time spent with patient was 60 minutes. Greater than 50% of time was spent in counseling and coordination of care.  Follow-Up Instructions: Return in about 4 weeks (around 07/17/2022) for inflammatory myopathy.   Bo Merino, MD  Note - This record has been created using Editor, commissioning.  Chart creation errors have been sought, but may not always  have been located. Such creation errors do not reflect on  the standard of medical care.

## 2022-06-11 NOTE — Progress Notes (Signed)
CT scan of the chest suspicious of interstitial lung disease.  Please refer patient to pulmonary for the evaluation of ILD (Jo 1 positive myositis).  CT abdomen shows fatty liver and diverticulosis.  I will discuss results at the follow-up visit.  Patient should get PSA through his PCP.

## 2022-06-12 ENCOUNTER — Telehealth: Payer: Self-pay | Admitting: Family Medicine

## 2022-06-12 ENCOUNTER — Telehealth: Payer: Self-pay | Admitting: *Deleted

## 2022-06-12 DIAGNOSIS — J849 Interstitial pulmonary disease, unspecified: Secondary | ICD-10-CM

## 2022-06-12 NOTE — Telephone Encounter (Signed)
-----   Message from Pollyann Savoy, MD sent at 06/11/2022  1:53 PM EDT ----- CT scan of the chest suspicious of interstitial lung disease.  Please refer patient to pulmonary for the evaluation of ILD (Jo 1 positive myositis).  CT abdomen shows fatty liver and diverticulosis.  I will discuss results at the follow-up visit.  Pa tient should get PSA through his PCP.

## 2022-06-12 NOTE — Telephone Encounter (Signed)
Caller name: [place]  On DPR? :yes/no: Yes  Call back number: [phone]  Provider they see: Beverely Low  Reason for call: PT was told by Dr. Mariane Duval, rheumatologist that he needs to have orders for a PSA lab visit.

## 2022-06-13 NOTE — Telephone Encounter (Signed)
Spoke w/ pt and advised pt that he had this done on 03/20/22 value was 0.18 . He is calling the Rheumatologist to inform them

## 2022-06-13 NOTE — Telephone Encounter (Signed)
PSA done 03/20/22- 0.18  No need to repeat

## 2022-06-15 ENCOUNTER — Other Ambulatory Visit: Payer: Self-pay | Admitting: *Deleted

## 2022-06-15 DIAGNOSIS — R768 Other specified abnormal immunological findings in serum: Secondary | ICD-10-CM | POA: Diagnosis not present

## 2022-06-15 DIAGNOSIS — R760 Raised antibody titer: Secondary | ICD-10-CM | POA: Diagnosis not present

## 2022-06-15 DIAGNOSIS — R700 Elevated erythrocyte sedimentation rate: Secondary | ICD-10-CM

## 2022-06-15 DIAGNOSIS — Z79899 Other long term (current) drug therapy: Secondary | ICD-10-CM | POA: Diagnosis not present

## 2022-06-19 ENCOUNTER — Ambulatory Visit (INDEPENDENT_AMBULATORY_CARE_PROVIDER_SITE_OTHER): Payer: BC Managed Care – PPO | Admitting: Family Medicine

## 2022-06-19 ENCOUNTER — Ambulatory Visit: Payer: BC Managed Care – PPO | Attending: Rheumatology | Admitting: Rheumatology

## 2022-06-19 ENCOUNTER — Encounter: Payer: Self-pay | Admitting: Rheumatology

## 2022-06-19 ENCOUNTER — Encounter: Payer: Self-pay | Admitting: Family Medicine

## 2022-06-19 VITALS — BP 143/83 | HR 99 | Resp 18 | Ht 74.0 in | Wt 210.4 lb

## 2022-06-19 VITALS — BP 130/80 | HR 89 | Temp 98.0°F | Wt 209.6 lb

## 2022-06-19 DIAGNOSIS — R600 Localized edema: Secondary | ICD-10-CM

## 2022-06-19 DIAGNOSIS — G7249 Other inflammatory and immune myopathies, not elsewhere classified: Secondary | ICD-10-CM

## 2022-06-19 DIAGNOSIS — I3139 Other pericardial effusion (noninflammatory): Secondary | ICD-10-CM

## 2022-06-19 DIAGNOSIS — I452 Bifascicular block: Secondary | ICD-10-CM

## 2022-06-19 DIAGNOSIS — R1319 Other dysphagia: Secondary | ICD-10-CM

## 2022-06-19 DIAGNOSIS — J849 Interstitial pulmonary disease, unspecified: Secondary | ICD-10-CM

## 2022-06-19 DIAGNOSIS — R768 Other specified abnormal immunological findings in serum: Secondary | ICD-10-CM | POA: Diagnosis not present

## 2022-06-19 DIAGNOSIS — M79641 Pain in right hand: Secondary | ICD-10-CM

## 2022-06-19 DIAGNOSIS — M25511 Pain in right shoulder: Secondary | ICD-10-CM

## 2022-06-19 DIAGNOSIS — T380X5A Adverse effect of glucocorticoids and synthetic analogues, initial encounter: Secondary | ICD-10-CM

## 2022-06-19 DIAGNOSIS — Z1211 Encounter for screening for malignant neoplasm of colon: Secondary | ICD-10-CM | POA: Diagnosis not present

## 2022-06-19 DIAGNOSIS — G8929 Other chronic pain: Secondary | ICD-10-CM

## 2022-06-19 DIAGNOSIS — Z86718 Personal history of other venous thrombosis and embolism: Secondary | ICD-10-CM | POA: Diagnosis not present

## 2022-06-19 DIAGNOSIS — M3320 Polymyositis, organ involvement unspecified: Secondary | ICD-10-CM | POA: Diagnosis not present

## 2022-06-19 DIAGNOSIS — D72828 Other elevated white blood cell count: Secondary | ICD-10-CM

## 2022-06-19 DIAGNOSIS — Z7952 Long term (current) use of systemic steroids: Secondary | ICD-10-CM

## 2022-06-19 DIAGNOSIS — R490 Dysphonia: Secondary | ICD-10-CM

## 2022-06-19 DIAGNOSIS — M818 Other osteoporosis without current pathological fracture: Secondary | ICD-10-CM

## 2022-06-19 DIAGNOSIS — E8889 Other specified metabolic disorders: Secondary | ICD-10-CM | POA: Diagnosis not present

## 2022-06-19 DIAGNOSIS — I252 Old myocardial infarction: Secondary | ICD-10-CM

## 2022-06-19 DIAGNOSIS — M79642 Pain in left hand: Secondary | ICD-10-CM

## 2022-06-19 DIAGNOSIS — Z79899 Other long term (current) drug therapy: Secondary | ICD-10-CM

## 2022-06-19 DIAGNOSIS — G40909 Epilepsy, unspecified, not intractable, without status epilepticus: Secondary | ICD-10-CM

## 2022-06-19 DIAGNOSIS — Z86711 Personal history of pulmonary embolism: Secondary | ICD-10-CM

## 2022-06-19 DIAGNOSIS — M25512 Pain in left shoulder: Secondary | ICD-10-CM

## 2022-06-19 DIAGNOSIS — R471 Dysarthria and anarthria: Secondary | ICD-10-CM

## 2022-06-19 LAB — QUANTIFERON-TB GOLD PLUS
Mitogen-NIL: 0.04 IU/mL
NIL: 0.01 IU/mL
QuantiFERON-TB Gold Plus: UNDETERMINED — AB
TB1-NIL: 0 IU/mL
TB2-NIL: 0 IU/mL

## 2022-06-19 MED ORDER — SULFAMETHOXAZOLE-TRIMETHOPRIM 800-160 MG PO TABS: 1.0000 | ORAL_TABLET | ORAL | 2 refills | Status: DC

## 2022-06-19 NOTE — Progress Notes (Signed)
   Subjective:    Patient ID: Cameron Kim, male    DOB: 04/04/1971, 51 y.o.   MRN: [id number]  HPI Polymyositis- pt saw Dr Corliss Skains and dx'd w/ Polymyositis.  Started Prednisone on 8/24.  Has appt upcoming at [facility].  Pt reports the prednisone is making him 'feel like Superman'.  Is regaining weight, able to move more easily.  Unable to sleep.  Has muscle bx pending for tomorrow.  Pt is frustrated b/c he would rather be working but is physically unable.  ? Interstitial lung disease- seen on CT scan done 06/08/22.  Has upcoming appt w/ pulmonary for evaluation.  Denies cough, SOB.  No CP, wheezing.  Colon cancer screen- pt was referred but missed his appt in August.  Will re-refer   Review of Systems For ROS see HPI     Objective:   Physical Exam Vitals reviewed.  Constitutional:      General: He is not in acute distress.    Appearance: Normal appearance. He is well-developed. He is not ill-appearing.  HENT:     Head: Normocephalic and atraumatic.  Eyes:     Extraocular Movements: Extraocular movements intact.     Conjunctiva/sclera: Conjunctivae normal.     Pupils: Pupils are equal, round, and reactive to light.  Neck:     Thyroid: No thyromegaly.  Cardiovascular:     Rate and Rhythm: Normal rate and regular rhythm.     Pulses: Normal pulses.     Heart sounds: Normal heart sounds. No murmur heard. Pulmonary:     Effort: Pulmonary effort is normal. No respiratory distress.     Breath sounds: Normal breath sounds.  Abdominal:     General: Bowel sounds are normal. There is no distension.     Palpations: Abdomen is soft.  Musculoskeletal:     Cervical back: Normal range of motion and neck supple.     Right lower leg: Edema (trace) present.     Left lower leg: Edema (trace) present.  Lymphadenopathy:     Cervical: No cervical adenopathy.  Skin:    General: Skin is warm and dry.  Neurological:     General: No focal deficit present.     Mental Status: He is alert and oriented  to person, place, and time.     Cranial Nerves: No cranial nerve deficit.  Psychiatric:        Mood and Affect: Mood normal.        Behavior: Behavior normal.          Assessment & Plan:

## 2022-06-19 NOTE — Patient Instructions (Addendum)
Schedule your complete physical for after March 20, 2023 You can cancel the appt for 9/22 No need for labs today- you've had enough recently!! They'll call you to schedule your GI appt for colonoscopy I'll be sure to follow along w/ the surgery reports, Rheumatology, and Pulmonary reports Call with any questions or concerns Hang in there!!!

## 2022-06-19 NOTE — Patient Instructions (Signed)
If your labs show that CK is normal or remarkably improved then we will reduce the dose of prednisone to 40 mg in the morning and 20 mg in the afternoon with meals.   Please get PPD placement by Dr. Beverely Low.   You will be contacted by speech therapy for swallow study.

## 2022-06-20 ENCOUNTER — Telehealth: Payer: Self-pay | Admitting: Family Medicine

## 2022-06-20 ENCOUNTER — Ambulatory Visit: Payer: Self-pay | Admitting: General Surgery

## 2022-06-20 ENCOUNTER — Ambulatory Visit (INDEPENDENT_AMBULATORY_CARE_PROVIDER_SITE_OTHER): Payer: BC Managed Care – PPO | Admitting: Family Medicine

## 2022-06-20 DIAGNOSIS — Z111 Encounter for screening for respiratory tuberculosis: Secondary | ICD-10-CM

## 2022-06-20 DIAGNOSIS — M3320 Polymyositis, organ involvement unspecified: Secondary | ICD-10-CM | POA: Diagnosis not present

## 2022-06-20 NOTE — Telephone Encounter (Signed)
TB Skin tests can only be administered Monday -Wednesday due to result timing please make nurse visit

## 2022-06-20 NOTE — Progress Notes (Signed)
Glucose is normal.  CK is improved at 475.  Patient may reduce prednisone to 40 mg in the morning and 20 mg in the evening.  He should continue on this dose until notified further.

## 2022-06-20 NOTE — Telephone Encounter (Signed)
Caller name: Jerrol [place]   On DPR? :yes/no: Yes  Call back number: [phone]  Provider they see:  Beverely Low   Reason for call:  pt called stating that his Nematologist states that he need another  TB test done. PT needs TB skin test. Pt states he need it done asap.

## 2022-06-20 NOTE — Progress Notes (Signed)
Patient came for a tb test

## 2022-06-22 LAB — LUPUS ANTICOAGULANT EVAL W/ REFLEX
PTT-LA Screen: 33 s (ref <=40)
dRVVT: 36 s (ref <=45)

## 2022-06-22 LAB — BASIC METABOLIC PANEL WITH GFR
BUN/Creatinine Ratio: 33 (calc) — ABNORMAL HIGH (ref 6–22)
BUN: 29 mg/dL — ABNORMAL HIGH (ref 7–25)
CO2: 29 mmol/L (ref 20–32)
Calcium: 9.3 mg/dL (ref 8.6–10.3)
Chloride: 104 mmol/L (ref 98–110)
Creat: 0.87 mg/dL (ref 0.70–1.30)
Glucose, Bld: 97 mg/dL (ref 65–99)
Potassium: 4 mmol/L (ref 3.5–5.3)
Sodium: 142 mmol/L (ref 135–146)
eGFR: 104 mL/min/{1.73_m2} (ref >=60)

## 2022-06-22 LAB — CK: Total CK: 475 U/L — ABNORMAL HIGH (ref 44–196)

## 2022-06-24 DIAGNOSIS — J849 Interstitial pulmonary disease, unspecified: Secondary | ICD-10-CM | POA: Insufficient documentation

## 2022-06-24 DIAGNOSIS — M3320 Polymyositis, organ involvement unspecified: Secondary | ICD-10-CM | POA: Insufficient documentation

## 2022-06-24 LAB — QUANTIFERON-TB GOLD PLUS
Mitogen-NIL: 0.27 IU/mL
NIL: 0.03 IU/mL
QuantiFERON-TB Gold Plus: UNDETERMINED — AB
TB1-NIL: 0.01 IU/mL
TB2-NIL: 0.02 IU/mL

## 2022-06-24 NOTE — Assessment & Plan Note (Signed)
New.  Noted on CT done 06/08/22.  Has upcoming appt w/ pulmonary for complete evaluation.  He is currently asymptomatic- no cough, SOB, CP, wheezing.  Will follow along.

## 2022-06-24 NOTE — Assessment & Plan Note (Signed)
New.  Pt was started on Prednisone on 8/24 by Dr Estanislado Pandy.  Reports he is currently feeling 'like Superman'.  Is starting to regain the weight he lost, is able to move more easily.  Is having trouble sleeping due to the prednisone.  Has muscle biopsy pending.  Right now he is physically unable to work due to his pain and limitations and this is frustrating to him.  He would rather work.  I told him this is not an option right now and he needs to give himself time to see where things go and whether his physical sxs improve.

## 2022-06-25 NOTE — Progress Notes (Signed)
Pt seen results via my chart  

## 2022-06-26 ENCOUNTER — Encounter: Payer: Self-pay | Admitting: Gastroenterology

## 2022-06-26 ENCOUNTER — Telehealth: Payer: Self-pay

## 2022-06-26 ENCOUNTER — Ambulatory Visit (INDEPENDENT_AMBULATORY_CARE_PROVIDER_SITE_OTHER): Payer: BC Managed Care – PPO | Admitting: Internal Medicine

## 2022-06-26 ENCOUNTER — Encounter: Payer: Self-pay | Admitting: Internal Medicine

## 2022-06-26 VITALS — BP 132/76 | HR 92 | Temp 98.9°F | Ht 74.0 in | Wt 211.0 lb

## 2022-06-26 DIAGNOSIS — M3320 Polymyositis, organ involvement unspecified: Secondary | ICD-10-CM

## 2022-06-26 DIAGNOSIS — R748 Abnormal levels of other serum enzymes: Secondary | ICD-10-CM | POA: Diagnosis not present

## 2022-06-26 DIAGNOSIS — R062 Wheezing: Secondary | ICD-10-CM

## 2022-06-26 DIAGNOSIS — R059 Cough, unspecified: Secondary | ICD-10-CM

## 2022-06-26 LAB — CBC WITH DIFFERENTIAL/PLATELET
Basophils Absolute: 0 10*3/uL (ref 0.0–0.1)
Basophils Relative: 0.2 % (ref 0.0–3.0)
Eosinophils Absolute: 0 10*3/uL (ref 0.0–0.7)
Eosinophils Relative: 0 % (ref 0.0–5.0)
HCT: 42.9 % (ref 39.0–52.0)
Hemoglobin: 13.7 g/dL (ref 13.0–17.0)
Lymphocytes Relative: 5.8 % — ABNORMAL LOW (ref 12.0–46.0)
Lymphs Abs: 0.9 10*3/uL (ref 0.7–4.0)
MCHC: 32 g/dL (ref 30.0–36.0)
MCV: 92 fl (ref 78.0–100.0)
Monocytes Absolute: 0.5 10*3/uL (ref 0.1–1.0)
Monocytes Relative: 3.5 % (ref 3.0–12.0)
Neutro Abs: 13.9 10*3/uL — ABNORMAL HIGH (ref 1.4–7.7)
Neutrophils Relative %: 90.5 % — ABNORMAL HIGH (ref 43.0–77.0)
Platelets: 221 10*3/uL (ref 150.0–400.0)
RBC: 4.66 Mil/uL (ref 4.22–5.81)
RDW: 20.7 % — ABNORMAL HIGH (ref 11.5–15.5)
WBC: 15.4 10*3/uL — ABNORMAL HIGH (ref 4.0–10.5)

## 2022-06-26 NOTE — Patient Instructions (Addendum)
ICD-10-CM   1. Cough in adult  R05.9     2. Wheezing  R06.2     3. Jo-1 positive polymyositis (HCC)  M33.20     4. Elevated CK  R74.8      We need to figure out if you have common causes for cough and wheezing such as asthma and allergies versus pulmonary issues specific to polymyositis such as interstitial lung disease and pulmonary hypertension  PLAN - Do echocardiogram - Do high-resolution CT chest supine and prone - Do full pulmonary function test  -Do exhaled nitric oxide (feNo) breathing test - Do blood work for CBC with differential, blood IgE and RAST allergy panel  Follow-up - Return to see Dr. Chase Caller in the next 4-6 weeks but after completing all of the above; 30 min slt -Keep up rheumatology visit to [facility]

## 2022-06-26 NOTE — Progress Notes (Signed)
OV 06/26/2022  Subjective:  Patient ID: Cameron Kim, male , DOB: July 21, 1971 , age 51 y.o. , MRN: [id number] , ADDRESS: [address] [place] [id number] PCP Cameron Minium, MD Patient Care Team: Cameron Minium, MD as PCP - General (Family Medicine) Cameron Harp, MD as PCP - Cardiology (Cardiology)  This Provider for this visit: Treatment Team:  Attending Provider: Brand Males, MD    06/26/2022 -   Chief Complaint  Patient presents with   Consult    Wheezing/ cough      HPI Cameron Kim 51 y.o. -referral from the rheumatology Dr. Estanislado Kim  who he saw for the first time on 05/31/2022.  He has been diagnosed with elevated CK and Jo 1 positive polymyositis.  He used to work in [employer].  He has had COVID booster mRNA 3 times.  He had COVID in November 2022 and sometime after that started developing symptoms of weakness of the proximal muscles.  This also included dysphagia, dysphonia and dysarthria after he saw rheumatology on 05/31/2022 he was placed on prednisone 40 mg twice daily.  After this is significantly improved.  His results were elevated CK which is improving.  Jo 1 antibody positive positive ANA.  QuantiFERON gold indeterminate.  He has not been referred to [facility] where he has an upcoming appointment.  In the last few to several months he is also noticing cough with some wheezing.  He is also choking on saliva.  There is no shortness of breath per se but then he is weak and is not able to walk too much.  He is not disabled from his work.  He did have a CT scan of the abdomen 06/08/2022 in the lung images do show abnormalities.  However walking desaturation test today with this is normal.  He does not have echocardiogram.  He does not have pulmonary function test.  There is no high-resolution CT chest.  Simple office walk 185 feet x  3 laps goal with forehead probe 06/26/2022    O2 used ra   Number laps completed 3    Comments about pace x   Resting Pulse Ox/HR 99% and 85/min   Final Pulse Ox/HR 97% and 105/min   Desaturated </= 88% no   Desaturated <= 3% points no   Got Tachycardic >/= 90/min yes   Symptoms at end of test x   Miscellaneous comments x       CT Chest data  No results found.    PFT      No data to display             has a past medical history of Abnormal EKG, Ankle fracture, left, Peripheral vascular disease ([place]) (2013), Pulmonary embolism ([place]) (05/2012), and Seizure disorder ([place]) (11/07/2020).   reports that he has never smoked. He has never been exposed to tobacco smoke. He has never used smokeless tobacco.  Past Surgical History:  Procedure Laterality Date   I & D EXTREMITY Right 06/07/2016   Procedure: IRRIGATION AND DEBRIDEMENT RIGHT WRIST,MEDIAN NERVE EXPLORATION NEUROLYSIS AND REPAIR;  Surgeon: Roseanne Kaufman, MD;  Location: [facility];  Service: Orthopedics;  Laterality: Right;   medial nerve surgry right arm     NO PAST SURGERIES      No Known Allergies  Immunization History  Administered Date(s) Administered   Influenza,inj,Quad PF,6+ Mos 09/01/2018   Moderna SARS-COV2 Booster Vaccination 11/05/2020   Tdap 10/08/2004, 12/03/2014  Family History  Problem Relation Age of Onset   Diabetes Mother    Hypertension Mother    Heart murmur Mother    Stroke Mother    Diabetes Father    Hypertension Father    ALS Father    Stroke Sister    Rheum arthritis Brother    Diabetes Paternal Grandmother    Diabetes Paternal Grandfather      Current Outpatient Medications:    apixaban (ELIQUIS) 5 MG TABS tablet, Take 1 tablet (5 mg total) by mouth 2 (two) times daily., Disp: 60 tablet, Rfl: 3   Cyanocobalamin (VITAMIN B-12 PO), Take by mouth daily., Disp: , Rfl:    FOLIC ACID PO, Take by mouth daily., Disp: , Rfl:    predniSONE (DELTASONE) 20 MG tablet, Take 2 tablets (40 mg total) by mouth 2 (two) times daily with a meal., Disp: 120 tablet, Rfl: 0    sulfamethoxazole-trimethoprim (BACTRIM DS) 800-160 MG tablet, Take 1 tablet by mouth 3 (three) times a week., Disp: 12 tablet, Rfl: 2      Objective:   Vitals:   06/26/22 1143  BP: 132/76  Pulse: 92  Temp: 98.9 F (37.2 C)  TempSrc: Oral  SpO2: 96%  Weight: 211 lb (95.7 kg)  Height: 6\' 2"  (1.88 m)    Estimated body mass index is 27.09 kg/m as calculated from the following:   Height as of this encounter: 6\' 2"  (1.88 m).   Weight as of this encounter: 211 lb (95.7 kg).  @WEIGHTCHANGE @  [employer]   06/26/22 1143  Weight: 211 lb (95.7 kg)    General: No distress. Looks well Neuro: Alert and Oriented x 3. GCS 15. Speech normal Psych: Pleasant Resp:  Barrel Chest - no.  Wheeze - no, Crackles - no, No overt respiratory distress CVS: Normal heart sounds. Murmurs - no Ext: Stigmata of Connective Tissue Disease -no classic evidence of Gottron's papules. HEENT: Normal upper airway. PEERL +. No post nasal drip        Assessment:       ICD-10-CM   1. Cough in adult  R05.9 Pulmonary function test    POCT EXHALED NITRIC OXIDE    2. Wheezing  R06.2     3. Jo-1 positive polymyositis (HCC)  M33.20 CT Chest High Resolution    Pulmonary function test    POCT EXHALED NITRIC OXIDE    CBC w/Diff    IgE    Resp Allergy Profile Regn2DC DE MD [facility]    ECHOCARDIOGRAM COMPLETE    4. Elevated CK  R74.8          Plan:     Patient Instructions     ICD-10-CM   1. Cough in adult  R05.9     2. Wheezing  R06.2     3. Jo-1 positive polymyositis (HCC)  M33.20     4. Elevated CK  R74.8      We need to figure out if you have common causes for cough and wheezing such as asthma and allergies versus pulmonary issues specific to polymyositis such as interstitial lung disease and pulmonary hypertension  PLAN - Do echocardiogram - Do high-resolution CT chest supine and prone - Do full pulmonary function test  -Do exhaled nitric oxide (feNo) breathing test - Do blood work  for CBC with differential, blood IgE and RAST allergy panel  Follow-up - Return to see Dr. Chase Kim in the next 4-6 weeks but after completing all of the above; 30 min slt -  Keep up [facility]ology visit to [facility]    Dr. Brand Kim, M.D., F.C.C.P,  Pulmonary and Critical Care Medicine Staff Physician, [place] Director - Interstitial Lung Disease  Program  Pulmonary [place] [facility] [place], [place], [address]  Pager: [phone], If no answer or between  15:00h - 7:00h: call [phone] Telephone: [phone]  12:24 PM 06/26/2022

## 2022-06-26 NOTE — Telephone Encounter (Signed)
Medical clearance letter faxed to [facility], [place] , for directions for Eliquis before and after biopsy.  Confirmation received

## 2022-06-27 LAB — RESPIRATORY ALLERGY PROFILE REGION II ~~LOC~~
Allergen, A. alternata, m6: <0.1 kU/L
Allergen, Cedar tree, t12: <0.1 kU/L
Allergen, Comm Silver Birch, t9: <0.1 kU/L
Allergen, Cottonwood, t14: <0.1 kU/L
Allergen, D pternoyssinus,d7: <0.1 kU/L
Allergen, Mouse Urine Protein, e78: <0.1 kU/L
Allergen, Mulberry, t76: <0.1 kU/L
Allergen, Oak,t7: <0.1 kU/L
Allergen, P. notatum, m1: <0.1 kU/L
Aspergillus fumigatus, m3: <0.1 kU/L
Bermuda Grass: <0.1 kU/L
Box Elder IgE: 0.15 kU/L — ABNORMAL HIGH
CLADOSPORIUM HERBARUM (M2) IGE: <0.1 kU/L
COMMON RAGWEED (SHORT) (W1) IGE: <0.1 kU/L
Cat Dander: <0.1 kU/L
Class: 0
Class: 0
Class: 0
Class: 0
Class: 0
Class: 0
Class: 0
Class: 0
Class: 0
Class: 0
Class: 0
Class: 0
Class: 0
Class: 0
Class: 0
Class: 0
Class: 0
Class: 0
Class: 0
Class: 0
Class: 0
Class: 0
Class: 0
Cockroach: <0.1 kU/L
D. farinae: <0.1 kU/L
Dog Dander: <0.1 kU/L
Elm IgE: <0.1 kU/L
IgE (Immunoglobulin E), Serum: 22 kU/L (ref <=114)
Johnson Grass: <0.1 kU/L
Pecan/Hickory Tree IgE: <0.1 kU/L
Rough Pigweed  IgE: <0.1 kU/L
Sheep Sorrel IgE: <0.1 kU/L
Timothy Grass: <0.1 kU/L

## 2022-06-27 LAB — INTERPRETATION:

## 2022-06-28 ENCOUNTER — Encounter (HOSPITAL_COMMUNITY): Payer: Self-pay | Admitting: General Surgery

## 2022-06-28 ENCOUNTER — Encounter (HOSPITAL_COMMUNITY)
Admission: RE | Admit: 2022-06-28 | Discharge: 2022-06-28 | Disposition: A | Discharge status: Home / Self Care | Payer: BC Managed Care – PPO | Source: Ambulatory Visit | Attending: Rheumatology | Admitting: Rheumatology

## 2022-06-28 DIAGNOSIS — M47814 Spondylosis without myelopathy or radiculopathy, thoracic region: Secondary | ICD-10-CM | POA: Diagnosis not present

## 2022-06-28 DIAGNOSIS — C799 Secondary malignant neoplasm of unspecified site: Secondary | ICD-10-CM | POA: Diagnosis not present

## 2022-06-28 DIAGNOSIS — M19012 Primary osteoarthritis, left shoulder: Secondary | ICD-10-CM | POA: Diagnosis not present

## 2022-06-28 DIAGNOSIS — G7249 Other inflammatory and immune myopathies, not elsewhere classified: Secondary | ICD-10-CM | POA: Diagnosis not present

## 2022-06-28 DIAGNOSIS — Z1289 Encounter for screening for malignant neoplasm of other sites: Secondary | ICD-10-CM | POA: Diagnosis not present

## 2022-06-28 MED ORDER — TECHNETIUM TC 99M MEDRONATE IV KIT
19.3000 | PACK | Freq: Once | INTRAVENOUS | Status: AC | PRN
Start: 2022-06-28 — End: 2022-06-28
  Administered 2022-06-28: 19.3 via INTRAVENOUS

## 2022-06-28 NOTE — Progress Notes (Signed)
SDW CALL  Patient was given pre-op instructions over the phone. The opportunity was given for the patient to ask questions. No further questions asked. Patient verbalized understanding of instructions given.   PCP - Dr Birdie Riddle Cardiologist -    PPM/ICD - n/a   Chest x-ray - had chest CT 2023 EKG - 02/28/22 Stress Test - denies ECHO - 02/22/22 Cardiac Cath - denies  Sleep Study - denies CPAP - denies   Blood Thinner Instructions: LD; 06/29/22 was instructed to hold 2 days Aspirin Instructions: n/a  Anesthesia review: yes, abnormal EKG with PVC and bigeminy   Patient denies shortness of breath, fever, cough and chest pain over the phone call   All instructions explained to the patient, with a verbal understanding of the material. Patient agrees to go over the instructions while at home for a better understanding.

## 2022-06-29 ENCOUNTER — Ambulatory Visit: Payer: BC Managed Care – PPO | Admitting: Family Medicine

## 2022-06-29 DIAGNOSIS — M0579 Rheumatoid arthritis with rheumatoid factor of multiple sites without organ or systems involvement: Secondary | ICD-10-CM | POA: Diagnosis not present

## 2022-06-29 DIAGNOSIS — M3320 Polymyositis, organ involvement unspecified: Secondary | ICD-10-CM | POA: Diagnosis not present

## 2022-06-29 DIAGNOSIS — Z79899 Other long term (current) drug therapy: Secondary | ICD-10-CM | POA: Diagnosis not present

## 2022-06-29 NOTE — Anesthesia Preprocedure Evaluation (Signed)
Anesthesia Evaluation Anesthesia Physical Anesthesia Plan  ASA:   Anesthesia Plan:    Post-op Pain Management:    Induction:   PONV Risk Score and Plan:   Airway Management Planned:   Additional Equipment:   Intra-op Plan:   Post-operative Plan:   Informed Consent:   Plan Discussed with:   Anesthesia Plan Comments: (PAT note written 06/29/2022 by Myra Gianotti, PA-C. )        Anesthesia Quick Evaluation

## 2022-06-29 NOTE — Progress Notes (Signed)
Anesthesia Chart Review: Cameron Kim  Case: [id number] Date/Time: 07/02/22 1030   Procedure: RIGHT THIGH MUSCLE BIOPSY (Right)   Anesthesia type: General   Pre-op diagnosis: POLYMYOSITIS   Location: [place] [facility] OR ROOM 05 / [place] OR   Surgeons: Jovita Kussmaul, MD       DISCUSSION: Patient is a 51 year old male scheduled for the above procedure.  He was recently diagnosed with Cameron Kim 1 antibody positive inflammatory myopathy and placed on prednisone 40 mg twice daily on 06/01/2022.  He has had gradual improvement of the symptoms..  At 06/19/2022 rheumatology visit with Dr. Estanislado Pandy, she lowered prednisone dose. He still had pending muscle biopsy.  History includes never smoker, dysrhythmia (runs of SVT, NSVT, occasional PVCs 03/2022 monitor), DVT (LLE 05/2012 post ankle fracture; LLE 04/17/21), PE (05/2012 post ankle fracture), seizure (2016 in setting of cocaine; 04/05/20 unprovoked?, + THC; normal EEG 05/02/20, no longer taking Keppra).  He had cardiology evaluation by Dr. Gwenlyn Found on 02/28/22 for evaluation of small pericardial effusion on 02/22/2022 echo. He had lose 60 lbs for unknown reasons during a 9 month incarceration, as well as developed weakness, muscle mass loss and rash.  EKG showed frequent PVCs, so 2-week monitor ordered and placed on metoprolol.  Recent echo showed normal LV systolic function, mild LVH, no significant valvular abnormalities.  Small pericardial effusion felt likely inconsequential..  Monitor results showed sinus rhythm with occasional PVCs and short runs of SVT and NSVT.  Per 05/09/22 follow-up, Dr. Gwenlyn Found did not think anything cardiovascular was going on and advised rheumatologic evaluation given his weakness.  As needed cardiology follow-up recommended.  Dede Query, PA-C with hematology gave permission to hold Xarelto for 48 hours and resume 24 hours with hemostasis (Letters tab).  He reported last dose scheduled for 06/29/2022.  He is a same day work-up. Anesthesia team to evaluate on the  day of surgery.    VS:  BP Readings from Last 3 Encounters:  06/26/22 132/76  06/19/22 (!) 143/83  06/19/22 130/80   Pulse Readings from Last 3 Encounters:  06/26/22 92  06/19/22 99  06/19/22 89     PROVIDERS: Midge Minium, MD is PCP. Last evaluation 06/24/22. He was awaiting neurology evaluation for possible ILD.  He was asymptomatic at that time without cough shortness of breath, chest pain, or wheezing. Bo Merino, MD is rheumatologist Brand Males, MD is pulmonologist. Recent evaluation 06/26/22 for cough and wheezing. Echo, high resolution chest CT, PFT, inhaled reN0 breathing test, labs with allergy panel planned to evaluate if symptoms could be from asthma or allergies versus pulmonary issues specific to polymyositis such as ILD or pulmonary hypertension. He was not wheezing at the time by exam.   Quay Burow, MD is cardiologist.  Dede Query, PA-C is hematology provider Kathrynn Ducking, MD (retired) was his neurologist. Last visit 11/07/20.   LABS: Most recent lab results in [facility] include: Lab Results  Component Value Date   WBC 15.4 (H) 06/26/2022   HGB 13.7 06/26/2022   HCT 42.9 06/26/2022   PLT 221.0 06/26/2022   GLUCOSE 97 06/19/2022   ALT 31 05/25/2022   AST 92 (H) 05/25/2022   NA 142 06/19/2022   K 4.0 06/19/2022   CL 104 06/19/2022   CREATININE 0.87 06/19/2022   BUN 29 (H) 06/19/2022   CO2 29 06/19/2022   TSH 4.62 05/11/2022   PSA 0.18 03/20/2022    OTHER: Simple office walk test 185 feet x 3 laps 06/26/22: Resting Pulse Ox/HR: 99% and  85/min Final Pulse OX/HR: 97% and 105/min  EEG 7/26/221: IMPRESSION:  Normal EEG in the awake and drowsy states.     IMAGES: Bone Scan 06/28/22: In process.  CT Chest/abd/pelvis 06/08/22: IMPRESSION: 1. Mildly enlarged bilateral external iliac nodes, nonspecific. Recommend correlation with PSA. 2. Mild bilateral lower lobe bronchiolectasis with basilar/lower lobe ground-glass opacity.  Occasional subpleural reticulation in the lower lobes. Findings may be due to interstitial lung disease, consider follow-up high-resolution chest CT in 3-6 months to assess for interval change. Trace left pleural effusion. 3. Mild hepatic steatosis. 4. Minimal distal colonic diverticulosis without diverticulitis. 5. Trace free fluid in the pelvis, which is nonspecific in a male patient. 6. Small fat containing right inguinal hernia.   EKG: EKG 02/28/2022 9:32:01 ([facility]): Sinus tachycardia at 106 bpm with frequent premature ventricular complexes in a pattern of bigeminy Left anterior fascicular block  EKG 02/28/2022 9:31:18 ([facility]): Sinus tachycardia at 109 bpm Left anterior fascicular block Cannot rule out anterior infarct, age undetermined   CV: Long-term monitor 03/07/2022-03/21/2022: Patch Wear Time:  14 days and 0 hours (2023-05-31T18:19:54-0400 to 2023-06-14T18:19:58-0400)  Patient had a min HR of 55 bpm, max HR of 200 bpm, and avg HR of 101 bpm. Predominant underlying rhythm was Sinus Rhythm. QRS morphology changes were present throughout recording. 5 Ventricular Tachycardia runs occurred, the run with the fastest interval  lasting 14 beats with a max rate of 200 bpm (avg 158 bpm); the run with the fastest interval was also the longest. 29 Supraventricular Tachycardia runs occurred, the run with the fastest interval lasting 11 beats with a max rate of 197 bpm, the longest  lasting 10 beats with an avg rate of 143 bpm. Some episodes of Supraventricular Tachycardia may be possible Atrial Tachycardia with variable block. Isolated SVEs were rare (<1.0%), SVE Couplets were rare (<1.0%), and SVE Triplets were rare (<1.0%).  Isolated VEs were rare (<1.0%, 3941), VE Couplets were rare (<1.0%, 214), and VE Triplets were rare (<1.0%, 19). Ventricular Bigeminy and Trigeminy were present.   1. SR 2. Runs of SVT and NSVT 3. Occasional PVCs   Echo 02/22/2022: IMPRESSIONS    1. Left ventricular ejection fraction, by estimation, is 60 to 65%. The  left ventricle has normal function. The left ventricle has no regional  wall motion abnormalities. There is mild left ventricular hypertrophy.  Left ventricular diastolic parameters  were normal.   2. Right ventricular systolic function is normal. The right ventricular  size is normal. There is normal pulmonary artery systolic pressure.   3. A small pericardial effusion is present.   4. The mitral valve is normal in structure. No evidence of mitral valve  regurgitation. No evidence of mitral stenosis.   5. The aortic valve is normal in structure. Aortic valve regurgitation is  not visualized. No aortic stenosis is present.   6. The inferior vena cava is normal in size with greater than 50%  respiratory variability, suggesting right atrial pressure of 3 mmHg.    LU Venous US 04/17/21: Summary:  RIGHT:  - No evidence of common femoral vein obstruction.     LEFT:  - Findings consistent with acute deep vein thrombosis involving the left  popliteal vein, and left gastrocnemius veins.  - Findings consistent with acute superficial vein thrombosis involving the  left small saphenous vein.     - No cystic structure found in the popliteal fossa.   Past Medical History:  Diagnosis Date   Abnormal EKG    Ankle fracture,  left    Dysrhythmia    extra beat or skips a beat   Peripheral vascular disease (HCC) 2013   dvt-pe after ankle fx   Pulmonary embolism (HCC) 05/2012   DVT traveled to lung   Seizure disorder (HCC) 11/07/2020   had 2 but none since not on seizure meds    Past Surgical History:  Procedure Laterality Date   I & D EXTREMITY Right 06/07/2016   Procedure: IRRIGATION AND DEBRIDEMENT RIGHT WRIST,MEDIAN NERVE EXPLORATION NEUROLYSIS AND REPAIR;  Surgeon: Dominica Severin, MD;  Location: [facility];  Service: Orthopedics;  Laterality: Right;   medial nerve surgry right arm      MEDICATIONS: No current  facility-administered medications for this encounter.    apixaban (ELIQUIS) 5 MG TABS tablet   Cyanocobalamin (VITAMIN B-12 PO)   FOLIC ACID PO   predniSONE (DELTASONE) 20 MG tablet   sulfamethoxazole-trimethoprim (BACTRIM DS) 800-160 MG tablet    Shonna Chock, PA-C Surgical Short Stay/Anesthesiology [facility] Phone [phone] [facility] Phone [phone] 06/29/2022 2:46 PM

## 2022-07-01 NOTE — Assessment & Plan Note (Signed)
Pt's PE notable for weight loss, decreased shoulder ROM bilaterally, diffuse muscle weakness, and rash.  Due for colonoscopy- referral placed.  UTD on Tdap.  Check labs.  Anticipatory guidance provided.

## 2022-07-01 NOTE — Assessment & Plan Note (Signed)
Pt has lost considerable weight since his incarceration.  BMI now in the overweight category but given a man of his size and frame, this is low.  Check labs to assess for underlying cause of weight loss and to risk stratify.

## 2022-07-02 ENCOUNTER — Encounter (HOSPITAL_COMMUNITY)
Admission: RE | Disposition: A | Discharge status: Home / Self Care | Payer: Self-pay | Source: Home / Self Care | Attending: General Surgery

## 2022-07-02 ENCOUNTER — Ambulatory Visit (HOSPITAL_COMMUNITY): Payer: BC Managed Care – PPO | Admitting: Vascular Surgery

## 2022-07-02 ENCOUNTER — Other Ambulatory Visit: Payer: Self-pay

## 2022-07-02 ENCOUNTER — Ambulatory Visit (HOSPITAL_COMMUNITY)
Admission: RE | Admit: 2022-07-02 | Discharge: 2022-07-02 | Disposition: A | Discharge status: Home / Self Care | Payer: BC Managed Care – PPO | Attending: General Surgery | Admitting: General Surgery

## 2022-07-02 ENCOUNTER — Encounter (HOSPITAL_COMMUNITY): Payer: Self-pay | Admitting: General Surgery

## 2022-07-02 DIAGNOSIS — Z86718 Personal history of other venous thrombosis and embolism: Secondary | ICD-10-CM | POA: Insufficient documentation

## 2022-07-02 DIAGNOSIS — I739 Peripheral vascular disease, unspecified: Secondary | ICD-10-CM | POA: Diagnosis not present

## 2022-07-02 DIAGNOSIS — M3320 Polymyositis, organ involvement unspecified: Secondary | ICD-10-CM | POA: Insufficient documentation

## 2022-07-02 HISTORY — DX: Cardiac arrhythmia, unspecified: I49.9

## 2022-07-02 HISTORY — PX: MUSCLE BIOPSY: SHX716

## 2022-07-02 SURGERY — MUSCLE BIOPSY
Anesthesia: General | Laterality: Right

## 2022-07-02 MED ORDER — GABAPENTIN 100 MG PO CAPS
100.0000 mg | ORAL_CAPSULE | ORAL | Status: AC
  Administered 2022-07-02: 100 mg via ORAL
  Filled 2022-07-02: qty 1

## 2022-07-02 MED ORDER — FENTANYL CITRATE (PF) 250 MCG/5ML IJ SOLN
INTRAMUSCULAR | Status: DC | PRN
  Administered 2022-07-02: 50 ug via INTRAVENOUS
  Administered 2022-07-02: 100 ug via INTRAVENOUS

## 2022-07-02 MED ORDER — MIDAZOLAM HCL 2 MG/2ML IJ SOLN
INTRAMUSCULAR | Status: DC | PRN
  Administered 2022-07-02: 2 mg via INTRAVENOUS

## 2022-07-02 MED ORDER — FENTANYL CITRATE (PF) 250 MCG/5ML IJ SOLN
INTRAMUSCULAR | Status: AC
  Filled 2022-07-02: qty 5

## 2022-07-02 MED ORDER — CHLORHEXIDINE GLUCONATE CLOTH 2 % EX PADS: 6.0000 | MEDICATED_PAD | Freq: Once | CUTANEOUS | Status: DC

## 2022-07-02 MED ORDER — BUPIVACAINE-EPINEPHRINE (PF) 0.25% -1:200000 IJ SOLN
INTRAMUSCULAR | Status: AC
  Filled 2022-07-02: qty 30

## 2022-07-02 MED ORDER — PROPOFOL 10 MG/ML IV BOLUS
INTRAVENOUS | Status: DC | PRN
  Administered 2022-07-02: 200 mg via INTRAVENOUS

## 2022-07-02 MED ORDER — ONDANSETRON HCL 4 MG/2ML IJ SOLN
INTRAMUSCULAR | Status: DC | PRN
  Administered 2022-07-02: 4 mg via INTRAVENOUS

## 2022-07-02 MED ORDER — PROPOFOL 10 MG/ML IV BOLUS
INTRAVENOUS | Status: AC
  Filled 2022-07-02: qty 20

## 2022-07-02 MED ORDER — LACTATED RINGERS IV SOLN: INTRAVENOUS | Status: DC | PRN

## 2022-07-02 MED ORDER — CEFAZOLIN SODIUM-DEXTROSE 2-4 GM/100ML-% IV SOLN
2.0000 g | INTRAVENOUS | Status: AC
  Administered 2022-07-02: 2 g via INTRAVENOUS
  Filled 2022-07-02: qty 100

## 2022-07-02 MED ORDER — DEXAMETHASONE SODIUM PHOSPHATE 10 MG/ML IJ SOLN
INTRAMUSCULAR | Status: DC | PRN
  Administered 2022-07-02: 5 mg via INTRAVENOUS

## 2022-07-02 MED ORDER — MIDAZOLAM HCL 2 MG/2ML IJ SOLN
INTRAMUSCULAR | Status: AC
  Filled 2022-07-02: qty 2

## 2022-07-02 MED ORDER — LIDOCAINE 2% (20 MG/ML) 5 ML SYRINGE
INTRAMUSCULAR | Status: DC | PRN
  Administered 2022-07-02: 100 mg via INTRAVENOUS

## 2022-07-02 MED ORDER — BUPIVACAINE-EPINEPHRINE 0.25% -1:200000 IJ SOLN
INTRAMUSCULAR | Status: DC | PRN
  Administered 2022-07-02: 5 mL

## 2022-07-02 MED ORDER — OXYCODONE HCL 5 MG PO TABS: 5.0000 mg | ORAL_TABLET | Freq: Four times a day (QID) | ORAL | 0 refills | Status: DC | PRN

## 2022-07-02 MED ORDER — LIDOCAINE 2% (20 MG/ML) 5 ML SYRINGE
INTRAMUSCULAR | Status: AC
  Filled 2022-07-02: qty 5

## 2022-07-02 MED ORDER — 0.9 % SODIUM CHLORIDE (POUR BTL) OPTIME
TOPICAL | Status: DC | PRN
  Administered 2022-07-02: 1000 mL

## 2022-07-02 MED ORDER — CHLORHEXIDINE GLUCONATE 0.12 % MT SOLN
OROMUCOSAL | Status: AC
  Administered 2022-07-02: 15 mL
  Filled 2022-07-02: qty 15

## 2022-07-02 MED ORDER — ACETAMINOPHEN 500 MG PO TABS
1000.0000 mg | ORAL_TABLET | ORAL | Status: AC
  Administered 2022-07-02: 1000 mg via ORAL
  Filled 2022-07-02: qty 2

## 2022-07-02 MED ORDER — PHENYLEPHRINE 80 MCG/ML (10ML) SYRINGE FOR IV PUSH (FOR BLOOD PRESSURE SUPPORT)
PREFILLED_SYRINGE | INTRAVENOUS | Status: DC | PRN
  Administered 2022-07-02 (×3): 80 ug via INTRAVENOUS

## 2022-07-02 SURGICAL SUPPLY — 31 items
APPLICATOR CHLORAPREP 10.5 ORG (MISCELLANEOUS) ×1 IMPLANT
BAG COUNTER SPONGE SURGICOUNT (BAG) ×1 IMPLANT
CNTNR URN SCR LID CUP LEK RST (MISCELLANEOUS) ×1 IMPLANT
CONT SPEC 4OZ STRL OR WHT (MISCELLANEOUS)
COVER SURGICAL LIGHT HANDLE (MISCELLANEOUS) ×1 IMPLANT
DEPRESSOR TONGUE 6 IN STERILE (GAUZE/BANDAGES/DRESSINGS) IMPLANT
DERMABOND ADVANCED .7 DNX12 (GAUZE/BANDAGES/DRESSINGS) IMPLANT
DRAPE LAPAROTOMY 100X72 PEDS (DRAPES) ×1 IMPLANT
DRSG TELFA 3X8 NADH STRL (GAUZE/BANDAGES/DRESSINGS) ×1 IMPLANT
ELECT REM PT RETURN 9FT ADLT (ELECTROSURGICAL) ×1
ELECTRODE REM PT RTRN 9FT ADLT (ELECTROSURGICAL) ×1 IMPLANT
GAUZE 4X4 16PLY ~~LOC~~+RFID DBL (SPONGE) ×1 IMPLANT
GLOVE BIO SURGEON STRL SZ7.5 (GLOVE) ×1 IMPLANT
GOWN STRL REUS W/ TWL LRG LVL3 (GOWN DISPOSABLE) ×2 IMPLANT
GOWN STRL REUS W/TWL LRG LVL3 (GOWN DISPOSABLE) ×2
KIT BASIN OR (CUSTOM PROCEDURE TRAY) ×1 IMPLANT
KIT TURNOVER KIT B (KITS) ×1 IMPLANT
NDL 18GX1X1/2 (RX/OR ONLY) (NEEDLE) IMPLANT
NDL HYPO 25GX1X1/2 BEV (NEEDLE) ×1 IMPLANT
NEEDLE 18GX1X1/2 (RX/OR ONLY) (NEEDLE) ×2 IMPLANT
NEEDLE HYPO 25GX1X1/2 BEV (NEEDLE) ×1 IMPLANT
NS IRRIG 1000ML POUR BTL (IV SOLUTION) ×1 IMPLANT
PACK GENERAL/GYN (CUSTOM PROCEDURE TRAY) ×1 IMPLANT
PAD ARMBOARD 7.5X6 YLW CONV (MISCELLANEOUS) ×1 IMPLANT
PENCIL SMOKE EVACUATOR (MISCELLANEOUS) ×1 IMPLANT
SUT MNCRL AB 4-0 PS2 18 (SUTURE) ×2 IMPLANT
SUT VIC AB 3-0 SH 27 (SUTURE) ×1
SUT VIC AB 3-0 SH 27X BRD (SUTURE) ×1 IMPLANT
SYR CONTROL 10ML LL (SYRINGE) ×1 IMPLANT
TOWEL GREEN STERILE (TOWEL DISPOSABLE) ×1 IMPLANT
TOWEL GREEN STERILE FF (TOWEL DISPOSABLE) ×1 IMPLANT

## 2022-07-02 NOTE — Anesthesia Procedure Notes (Signed)
Procedure Name: LMA Insertion Date/Time: 07/02/2022 3:51 PM  Performed by: Mariea Clonts, CRNAPre-anesthesia Checklist: Patient identified, Emergency Drugs available, Suction available and Patient being monitored Patient Re-evaluated:Patient Re-evaluated prior to induction Oxygen Delivery Method: Circle System Utilized Preoxygenation: Pre-oxygenation with 100% oxygen Induction Type: IV induction Ventilation: Mask ventilation without difficulty LMA: LMA inserted LMA Size: 5.0 Number of attempts: 1 Airway Equipment and Method: Bite block Placement Confirmation: positive ETCO2 Tube secured with: Tape Dental Injury: Teeth and Oropharynx as per pre-operative assessment

## 2022-07-02 NOTE — Transfer of Care (Signed)
Immediate Anesthesia Transfer of Care Note  Patient: Cameron Kim  Procedure(s) Performed: RIGHT THIGH MUSCLE BIOPSY (Right)  Patient Location: PACU  Anesthesia Type:General  Level of Consciousness: awake, alert  and oriented  Airway & Oxygen Therapy: Patient Spontanous Breathing and Patient connected to nasal cannula oxygen  Post-op Assessment: Report given to RN and Post -op Vital signs reviewed and stable  Post vital signs: Reviewed and stable  Last Vitals:  Vitals Value Taken Time  BP    Temp    Pulse 77 07/02/22 1632  Resp 12 07/02/22 1632  SpO2 99 % 07/02/22 1632  Vitals shown include unvalidated device data.  Last Pain:  Vitals:   07/02/22 1221  TempSrc:   PainSc: 0-No pain         Complications: No notable events documented.

## 2022-07-02 NOTE — Op Note (Signed)
07/02/2022  4:17 PM  PATIENT:  Cameron Kim  51 y.o. male  PRE-OPERATIVE DIAGNOSIS:  POLYMYOSITIS  POST-OPERATIVE DIAGNOSIS:  POLYMYOSITIS  PROCEDURE:  Procedure(s): RIGHT THIGH MUSCLE BIOPSY (Right)  SURGEON:  Surgeon(s) and Role:    * Jovita Kussmaul, MD - Primary  PHYSICIAN ASSISTANT:   ASSISTANTS: none   ANESTHESIA:   local and general  EBL:  10 mL   BLOOD ADMINISTERED:none  DRAINS: none   LOCAL MEDICATIONS USED:  MARCAINE     SPECIMEN:  Source of Specimen:  right thigh muscle  DISPOSITION OF SPECIMEN:  PATHOLOGY  COUNTS:  YES  TOURNIQUET:  * No tourniquets in log *  DICTATION: .Dragon Dictation  After informed consent was obtained the patient was brought to the operating room and placed in the supine position on the operating table.  After adequate induction of general anesthesia the patient's right thigh was prepped with ChloraPrep, allowed to dry, and draped in usual sterile manner.  An appropriate timeout was performed.  A site was chosen over the right vastus lateralis muscle.  This area was infiltrated with quarter percent Marcaine.  A small longitudinally oriented incision was made with a 15 blade knife.  The incision was carried through the skin and subcutaneous tissue sharply with the electrocautery until the fascia of the muscle was identified.  The fascia muscle was opened sharply with the electrocautery.  A bundle of thigh muscle was identified.  It was divided proximally and distally sharply with Metzenbaum scissors and a bundle of muscle approximately a centimeter in diameter and a centimeter and half in length was removed from the patient.  The muscle was placed on a moistened Telfa pad and anchored in place with needles.  The muscle was then sent to pathology for further evaluation.  Hemostasis was achieved using the Bovie electrocautery.  The fascia of the muscle was then closed with a running 2-0 Vicryl stitch.  The skin was then closed with a running 4-0  Monocryl subcuticular stitch.  Dermabond dressings were applied.  The patient tolerated the procedure well.  At the end of the case all needle sponge and instrument counts were correct.  The patient was then awakened and taken recovery in stable condition.  PLAN OF CARE: Discharge to home after PACU  PATIENT DISPOSITION:  PACU - hemodynamically stable.   Delay start of Pharmacological VTE agent (>24hrs) due to surgical blood loss or risk of bleeding: not applicable

## 2022-07-02 NOTE — Progress Notes (Signed)
Total body bone scan showed some degenerative changes due to aging process.  There is 1 area on the rib which is of low suspicion for cancer per radiology report.

## 2022-07-02 NOTE — Progress Notes (Signed)
No further intervention is needed as regards to total body bone scan.  He will be evaluated soon at [facility].

## 2022-07-02 NOTE — H&P (Signed)
REFERRING PHYSICIAN: Christoper Allegra, MD  PROVIDER: Landry Corporal, MD  MRN: [id number] DOB: [birth year] Subjective   Chief Complaint: New Consultation (Muscle Biopsy )   History of Present Illness: Cameron Kim is a 51 y.o. male who is seen today as an office consultation for evaluation of New Consultation (Muscle Biopsy ) .   We are asked to see the patient in consultation by Dr. Estanislado Pandy to evaluate him for polymyositis and muscle biopsy. The patient is a 51 year old black male who originally began having problems about a year ago. He has been experiencing generalized weakness of both the upper and lower extremities. He was recently diagnosed with a polymyositis. He has been started on high-dose steroids and seems to get some improvement from that. He has been sent to [place] for a muscle biopsy to help with diagnosis and treatment plan.  Review of Systems: A complete review of systems was obtained from the patient. I have reviewed this information and discussed as appropriate with the patient. See HPI as well for other ROS.  ROS   Medical History: Past Medical History:  Diagnosis Date  Arthritis  DVT (deep venous thrombosis) (CMS-HCC)   Patient Active Problem List  Diagnosis  Polymyositis (CMS-HCC)   Past Surgical History:  Procedure Laterality Date  Right Wrist Surgery 2017    No Known Allergies  Current Outpatient Medications on File Prior to Visit  Medication Sig Dispense Refill  apixaban (ELIQUIS) 5 mg tablet Take 5 mg by mouth 2 (two) times daily  predniSONE (DELTASONE) 20 MG tablet Take by mouth  cyanocobalamin (VITAMIN B12) 100 MCG tablet Take by mouth once daily  folic acid (FOLVITE) 323 MCG tablet Take by mouth once daily   No current facility-administered medications on file prior to visit.   History reviewed. No pertinent family history.   Social History   Tobacco Use  Smoking Status Never  Smokeless Tobacco Never    Social History    Socioeconomic History  Marital status: Married  Tobacco Use  Smoking status: Never  Smokeless tobacco: Never  Substance and Sexual Activity  Alcohol use: Not Currently  Drug use: Never   Objective:   Vitals:   BP: (!) 142/72  Pulse: 87  Temp: 36.9 C (98.4 F)  SpO2: 99%  Weight: 96.7 kg (213 lb 3.2 oz)  Height: 188 cm (6\' 2" )   Body mass index is 27.37 kg/m.  Physical Exam Constitutional:  General: He is not in acute distress. Appearance: Normal appearance.  HENT:  Head: Normocephalic and atraumatic.  Right Ear: External ear normal.  Left Ear: External ear normal.  Nose: Nose normal.  Mouth/Throat:  Mouth: Mucous membranes are moist.  Pharynx: Oropharynx is clear.  Eyes:  General: No scleral icterus. Extraocular Movements: Extraocular movements intact.  Conjunctiva/sclera: Conjunctivae normal.  Pupils: Pupils are equal, round, and reactive to light.  Cardiovascular:  Rate and Rhythm: Normal rate and regular rhythm.  Pulses: Normal pulses.  Heart sounds: Normal heart sounds.  Pulmonary:  Effort: Pulmonary effort is normal. No respiratory distress.  Breath sounds: Normal breath sounds.  Abdominal:  General: Abdomen is flat. Bowel sounds are normal. There is no distension.  Palpations: Abdomen is soft.  Tenderness: There is no abdominal tenderness.  Musculoskeletal:  General: No swelling or deformity. Normal range of motion.  Cervical back: Normal range of motion and neck supple. No tenderness.  Comments: There is some generalized weakness of the muscles.  Skin: General: Skin is warm and dry.  Coloration: Skin is  not jaundiced.  Neurological:  General: No focal deficit present.  Mental Status: He is alert and oriented to person, place, and time.  Psychiatric:  Mood and Affect: Mood normal.  Behavior: Behavior normal.    Labs, Imaging and Diagnostic Testing:  Assessment and Plan:   Diagnoses and all orders for this visit:  Polymyositis  (CMS-HCC) - [facility] Case Posting Request; Future    The patient appears to have a polymyositis causing generalized weakness. As part of his diagnosis and treatment we have been asked to perform a muscle biopsy which I feel is very reasonable. I have discussed with him in detail the risks and benefits of the operation as well as some of the technical aspects and he understands and wishes to proceed. We will plan for a right thigh muscle biopsy in the near future. He will continue to take his steroids. He will need to stop his Eliquis 2 days prior to surgery

## 2022-07-02 NOTE — Interval H&P Note (Signed)
History and Physical Interval Note:  07/02/2022 10:03 AM  Cameron Kim  has presented today for surgery, with the diagnosis of POLYMYOSITIS.  The various methods of treatment have been discussed with the patient and family. After consideration of risks, benefits and other options for treatment, the patient has consented to  Procedure(s): RIGHT THIGH MUSCLE BIOPSY (Right) as a surgical intervention.  The patient's history has been reviewed, patient examined, no change in status, stable for surgery.  I have reviewed the patient's chart and labs.  Questions were answered to the patient's satisfaction.     [name]

## 2022-07-02 NOTE — Progress Notes (Signed)
Spoke with Dr. Daiva Huge this am prior to noon. She gave verbal order that pt could have water until 1200 noon today and then NPO as pt's surgery is now scheduled for 1500.

## 2022-07-03 ENCOUNTER — Encounter (HOSPITAL_COMMUNITY): Payer: Self-pay | Admitting: General Surgery

## 2022-07-03 NOTE — Anesthesia Postprocedure Evaluation (Signed)
Anesthesia Post Note  Patient: Leelyn Iran  Procedure(s) Performed: RIGHT THIGH MUSCLE BIOPSY (Right)     Patient location during evaluation: PACU Anesthesia Type: General Level of consciousness: awake and alert Pain management: pain level controlled Vital Signs Assessment: post-procedure vital signs reviewed and stable Respiratory status: spontaneous breathing, nonlabored ventilation, respiratory function stable and patient connected to nasal cannula oxygen Cardiovascular status: blood pressure returned to baseline and stable Postop Assessment: no apparent nausea or vomiting Anesthetic complications: no   No notable events documented.  Last Vitals:  Vitals:   07/02/22 1645 07/02/22 1700  BP: 127/86 (!) 126/93  Pulse: 79 79  Resp: 15 (!) 25  Temp:  36.5 C  SpO2: 97% 97%    Last Pain:  Vitals:   07/02/22 1645  TempSrc:   PainSc: 0-No pain                 Cabela Pacifico S

## 2022-07-03 NOTE — Progress Notes (Signed)
Office Visit Note  Patient: Cameron Kim             Date of Birth: 13-Mar-1971           MRN: [id number]             PCP: Sheliah Hatchabori, Katherine E, MD Referring: Sheliah Hatchabori, Katherine E, MD Visit Date: 07/17/2022 Occupation: @GUAROCC @  Subjective:  Medication management  History of Present Illness: Cameron Kim is a 51 y.o. Cameron Kim 1 positive inflammatory bursitis.  He had muscle biopsy on July 02, 2022.  He was [facility] by Dr. Steva ColderApte on June 29, 2022.  He was placed on CellCept as planned.  He still stays on prednisone 60 mg p.o. daily.  Is not noticed any increased muscle weakness or tenderness.  He continues to feel stronger and is doing well.  According the patient he has gained weight since his last visit.  He also notices that the splitting on his fingers has improved since he has been on prednisone.  Patient states that he had PPD placed to Dr. Rennis Goldenabori's office but did not have a follow-up visit to read the PPD test.  Patient was evaluated by Dr. Marchelle Gearingamaswamy and had high-resolution CT scan which showed UIP pattern.  It also showed aortic atherosclerosis.  Activities of Daily Living:  Patient reports morning stiffness for 1 hour.   Patient Denies nocturnal pain.  Difficulty dressing/grooming: Reports Difficulty climbing stairs: Reports Difficulty getting out of chair: Reports Difficulty using hands for taps, buttons, cutlery, and/or writing: Denies  Review of Systems  Constitutional:  Positive for fatigue.  HENT:  Positive for mouth dryness. Negative for mouth sores.   Eyes:  Positive for dryness.  Respiratory:  Negative for shortness of breath.   Cardiovascular:  Positive for palpitations. Negative for chest pain.  Gastrointestinal:  Negative for blood in stool, constipation and diarrhea.  Endocrine: Negative for increased urination.  Genitourinary:  Negative for involuntary urination.  Musculoskeletal:  Positive for morning stiffness. Negative for joint  pain, gait problem, joint pain, joint swelling, myalgias, muscle weakness, muscle tenderness and myalgias.  Skin:  Positive for rash and redness. Negative for color change and sensitivity to sunlight.  Allergic/Immunologic: Negative for susceptible to infections.  Neurological:  Negative for dizziness and headaches.  Hematological:  Negative for swollen glands.  Psychiatric/Behavioral:  Positive for sleep disturbance. Negative for depressed mood. The patient is not nervous/anxious.     PMFS History:  Patient Active Problem List   Diagnosis Date Noted   Polymyositis, organ involvement unspecified (HCC) 06/24/2022   Interstitial lung disease (HCC) 06/24/2022   History of DVT (deep vein thrombosis) 05/28/2022   Low serum albumin 05/13/2022   Pericardial effusion 02/28/2022   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   Unintentional weight loss 02/11/2022   Lower extremity edema 02/11/2022   Dysarthria 02/11/2022   Weakness generalized 02/11/2022   Chronic pain of both shoulders 02/11/2022   Dry skin 02/11/2022   History of non-ST elevation myocardial infarction (NSTEMI) 02/11/2022   Acute deep vein thrombosis (DVT) of lower extremity (HCC) 05/02/2021   Seizure disorder (HCC) 11/07/2020   Epistaxis 06/15/2020   Erectile dysfunction 10/31/2017   Adjustment disorder with depressed mood 03/25/2017   Glucose intolerance 02/25/2017   Physical exam 12/03/2014   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 fascicular block 01/18/2014   Hx of pulmonary embolus 01/13/2014   Soft tissue mass 01/13/2014   Overweight (BMI 25.0-29.9) 01/13/2014   Allergic rhinitis 01/13/2014    Past  Medical History:  Diagnosis Date   Abnormal EKG    Ankle fracture, left    Dysrhythmia    extra beat or skips a beat   Peripheral vascular disease (HCC) 2013   dvt-pe after ankle fx   Pulmonary embolism (HCC) 05/2012   DVT traveled to lung   Seizure disorder (HCC) 11/07/2020   had 2 but none since not on seizure meds     Family History  Problem Relation Age of Onset   Diabetes Mother    Hypertension Mother    Heart murmur Mother    Stroke Mother    Diabetes Father    Hypertension Father    ALS Father    Stroke Sister    Rheum arthritis Brother    Diabetes Paternal Grandmother    Diabetes Paternal Grandfather    Past Surgical History:  Procedure Laterality Date   CARDIAC CATHETERIZATION  2021   I & D EXTREMITY Right 06/07/2016   Procedure: IRRIGATION AND DEBRIDEMENT RIGHT WRIST,MEDIAN NERVE EXPLORATION NEUROLYSIS AND REPAIR;  Surgeon: Dominica Severin, MD;  Location: [facility] OR;  Service: Orthopedics;  Laterality: Right;   medial nerve surgry right arm     MUSCLE BIOPSY Right 07/02/2022   Procedure: RIGHT THIGH MUSCLE BIOPSY;  Surgeon: Griselda Miner, MD;  Location: [facility] OR;  Service: General;  Laterality: Right;   Social History   Social History Narrative   Not on file   Immunization History  Administered Date(s) Administered   Influenza,inj,Quad PF,6+ Mos 09/01/2018   Moderna SARS-COV2 Booster Vaccination 11/05/2020   Tdap 10/08/2004, 12/03/2014     Objective: Vital Signs: BP 135/82 (BP Location: Left Arm, Patient Position: Sitting, Cuff Size: Normal)   Pulse (!) 102   Resp 18   Ht 6\' 2"  (1.88 m)   Wt 227 lb (103 kg)   BMI 29.15 kg/m    Physical Exam Vitals and nursing note reviewed.  Constitutional:      Appearance: He is well-developed.  HENT:     Head: Normocephalic and atraumatic.  Eyes:     Conjunctiva/sclera: Conjunctivae normal.     Pupils: Pupils are equal, round, and reactive to light.  Cardiovascular:     Rate and Rhythm: Normal rate and regular rhythm.     Heart sounds: Normal heart sounds.  Pulmonary:     Effort: Pulmonary effort is normal.     Breath sounds: Normal breath sounds.  Abdominal:     General: Bowel sounds are normal.     Palpations: Abdomen is soft.  Musculoskeletal:     Cervical back: Normal range of motion and neck supple.  Skin:    General: Skin is  warm and dry.     Capillary Refill: Capillary refill takes less than 2 seconds.  Neurological:     Mental Status: He is alert and oriented to person, place, and time.     Comments: Muscle strength 4+ or 5 in all extremities.  Psychiatric:        Behavior: Behavior normal.      Musculoskeletal Exam: C-spine was in good range of motion.  Shoulder joint abduction was limited bilaterally.  Elbow joints and wrist joints with good range of motion.  There was no synovitis of MCPs PIPs or DIPs.  Hip joints and knee joints were in good range of motion.  There was no tenderness over ankles or MTPs.  CDAI Exam: CDAI Score: -- Patient Global: --; Provider Global: -- Swollen: --; Tender: -- Joint Exam 07/17/2022   No  joint exam has been documented for this visit   There is currently no information documented on the homunculus. Go to the Rheumatology activity and complete the homunculus joint exam.  Investigation: No additional findings.  Imaging: US Abdomen Limited RUQ (LIVER/GB)  Result Date: 07/12/2022 CLINICAL DATA:  Elevated LFTs.  Weight loss.  Fatigue. EXAM: ULTRASOUND ABDOMEN LIMITED RIGHT UPPER QUADRANT COMPARISON:  None Available. FINDINGS: Gallbladder: Evaluation of the gallbladder is limited due to lack of distention. Within this limitation, the gallbladder is normal in appearance. Common bile duct: Diameter: 3.5 mm Liver: No focal lesion identified. Within normal limits in parenchymal echogenicity. Portal vein is patent on color Doppler imaging with normal direction of blood flow towards the liver. Other: None. IMPRESSION: 1. Evaluation of the gallbladder is limited due to lack of distention. Within this limitation, the gallbladder is normal in appearance. 2. No other abnormalities. Electronically Signed   By: Gerome Sam III M.D.   On: 07/12/2022 11:27   CT Chest High Resolution  Result Date: 07/06/2022 CLINICAL DATA:  51 year old male history of polymyositis. Evaluate for  interstitial lung disease. EXAM: CT CHEST WITHOUT CONTRAST TECHNIQUE: Multidetector CT imaging of the chest was performed following the standard protocol without intravenous contrast. High resolution imaging of the lungs, as well as inspiratory and expiratory imaging, was performed. RADIATION DOSE REDUCTION: This exam was performed according to the departmental dose-optimization program which includes automated exposure control, adjustment of the mA and/or kV according to patient size and/or use of iterative reconstruction technique. COMPARISON:  CT of the chest 06/08/2022. FINDINGS: Cardiovascular: Heart size is normal. Trace amount of pericardial fluid and/or thickening, unlikely to be of any hemodynamic significance at this time. No associated pericardial calcification. There is aortic atherosclerosis, as well as atherosclerosis of the great vessels of the mediastinum and the coronary arteries, including calcified atherosclerotic plaque in the left anterior descending coronary artery. Mediastinum/Nodes: No pathologically enlarged mediastinal or hilar lymph nodes. Please note that accurate exclusion of hilar adenopathy is limited on noncontrast CT scans. Esophagus is unremarkable in appearance. No axillary lymphadenopathy. Lungs/Pleura: High-resolution images demonstrate widespread areas of ground-glass attenuation, septal thickening, thickening of the peribronchovascular interstitium, mild cylindrical traction bronchiectasis and peripheral bronchiolectasis in the lungs bilaterally, most evident in the mid to lower lungs. No frank honeycombing. Inspiratory and expiratory imaging is unremarkable. No acute consolidative airspace disease. No pleural effusions. No definite suspicious appearing pulmonary nodules or masses are noted. Upper Abdomen: Unremarkable. Musculoskeletal: There are no aggressive appearing lytic or blastic lesions noted in the visualized portions of the skeleton. IMPRESSION: 1. The appearance of  the lungs is indicative of interstitial lung disease, with a spectrum of findings considered probable usual interstitial pneumonia (UIP) per current ATS guidelines. Repeat high-resolution chest CT is recommended in 12 months to assess for temporal changes in the appearance of the lung parenchyma. 2. Aortic atherosclerosis, in addition to left anterior descending coronary artery disease. Please note that although the presence of coronary artery calcium documents the presence of coronary artery disease, the severity of this disease and any potential stenosis cannot be assessed on this non-gated CT examination. Assessment for potential risk factor modification, dietary therapy or pharmacologic therapy may be warranted, if clinically indicated. Aortic Atherosclerosis (ICD10-I70.0). Electronically Signed   By: Trudie Reed M.D.   On: 07/06/2022 06:57   NM Bone Scan Whole Body  Result Date: 06/30/2022 CLINICAL DATA:  Cancer screening. Patient diagnosed with idiopathic inflammatory myopathy. EXAM: NUCLEAR MEDICINE WHOLE BODY BONE SCAN TECHNIQUE: Whole body  anterior and posterior images were obtained approximately 3 hours after intravenous injection of radiopharmaceutical. RADIOPHARMACEUTICALS:  19.3 mCi Technetium-7m MDP IV COMPARISON:  None Available. FINDINGS: There is mild increased uptake in the left posterior sixth rib which correlates with a subtle lucent lesion identified in retrospect on the CT scan from June 08, 2022. Mild uptake in the thoracic spine is consistent with degenerative changes identified on the comparison CT scan. Uptake in the left sternoclavicular joint is consistent with chronic degenerative changes in this region on CT imaging. No other significant bony or soft tissue abnormalities are identified on today's study. IMPRESSION: 1. There is a short segment of linear uptake in the posterior left sixth rib which correlates with a subtle lucency on recent CT imaging. This finding is  nonspecific. Given the lack of other suspicious uptake, the chance for malignancy is relatively low in this region. A solitary rib lesion has approximately a 10% chance of representing metastatic disease in a patient with known malignancy. 2. Uptake at the left sternoclavicular joint is due to chronic degenerative change. 3. Degenerative changes in the thoracic spine. 4. No other bony or soft tissue abnormalities identified. Electronically Signed   By: Dorise Bullion III M.D.   On: 06/30/2022 17:08    Recent Labs: Lab Results  Component Value Date   WBC 15.4 (H) 06/26/2022   HGB 13.7 06/26/2022   PLT 221.0 06/26/2022   NA 142 06/19/2022   K 4.0 06/19/2022   CL 104 06/19/2022   CO2 29 06/19/2022   GLUCOSE 97 06/19/2022   BUN 29 (H) 06/19/2022   CREATININE 0.87 06/19/2022   BILITOT 0.4 05/25/2022   ALKPHOS 60 05/25/2022   AST 92 (H) 05/25/2022   ALT 31 05/25/2022   PROT 7.8 05/31/2022   ALBUMIN 2.4 (L) 05/25/2022   CALCIUM 9.3 06/19/2022   GFRAA >60 06/06/2016   QFTBGOLDPLUS INDETERMINATE (A) 06/20/2022    Speciality Comments: No specialty comments available.  Procedures:  No procedures performed Allergies: Patient has no known allergies.   Assessment / Plan:     Visit Diagnoses: Idiopathic inflammatory myopathy-Kim-1 Ab +, Ro Ab + - elevated CK, aldolase, positive ANA, positive RF, positive anti-CCP, severe muscle weakness with dysphagia, dysphonia and dysarthria, mechanics hands. -Patient is on prednisone 40 mg p.o. daily currently.  He has been on CellCept 500 mg p.o. twice daily for last 1 week as prescribed by [facility] rheumatology.  He had a muscle biopsy on July 02, 2022 and the results are pending.  He denies any muscular weakness or tenderness.  He states he has been very active and doing routine activities.  He denies any side effects from CellCept.  I did detailed discussion with the patient regarding prednisone taper.  We will taper prednisone by 5 mg every week until he  reaches 20 mg p.o. daily.  Then we will taper by 2.5 mg every week until he reaches 10 mg, and then by 1 mg every 2 weeks as tolerated.  The prednisone taper will depend on his lab work.  He will be on CellCept 500 mg twice daily for 2 weeks and if labs are stable we will increase CellCept to 1 g p.o. twice daily.  He will get labs 2 weeks x 2 and then every 2 months.  I we will check CK in 2 weeks.  Plan: CK, patient had an extensive work-up to rule out malignancy.  I will also refer him to urology for testicular examination and prostate examination.  Ambulatory  referral to Urology  Long term (current) use of systemic steroids - prednisone 60 mg p.o. daily.  Prednisone taper was discussed as described above.- Plan: CBC with Differential/Platelet, COMPLETE METABOLIC PANEL WITH GFR in 2 weeks x 2  High risk medication use-he started CellCept 500 mg twice daily a week ago.  Advised him to get labs in 2 weeks after starting CellCept x2 and then every 2 months.  Information about immunization was placed in the AVS.  He can receive immunization after his prednisone dose is below 20 mg.  He is also advised to get PPD through his PCPs office as his TB Gold test was indeterminate x2.  Patient states that he had a PPD placed but he did not go for reading the last time.  TPMT intermediate metabolizer (HCC) - TPMT was 6, low metabolizer  Interstitial lung disease (HCC)-he was evaluated by Dr. Marchelle Gearing.  High-resolution CT on July 04, 2022 showed features of UIP.  He will have a follow-up appointment with Dr. Cherly Beach or me to discuss this further.  CT scan results were discussed with the patient.  It also showed aortic atherosclerosis and calcification in the coronaries.  Other dysphagia-he has an appointment pending with Dr. Myrtie Neither.  Dysphagia has improved on prednisone.  Chronic pain of both shoulders-he has bilateral rotator cuff tear and has been followed by Dr. August Saucer.  Pain in both hands-resolved on  prednisone.  He also had mechanics hands which  resolved.  History of DVT (deep vein thrombosis) - anticardiolipin antibody and beta-2 GP 1 were negative.  He has positive ANA and positive Ro antibody.  He is on Eliquis.  Leukocytosis/thrombocytosis - he has longstanding history of leukocytosis and thrombocytosis.  Hx of pulmonary embolus-he is on Eliquis.  History of non-ST elevation myocardial infarction (NSTEMI) - he is on Eliquis.  Pericardial effusion  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 fascicular block  Lower extremity edema  Seizure disorder (HCC)  Steroid-induced osteoporosis - plan to discuss alendronate 70 mg p.o. weekly for prevention of steroid-induced osteoporosis the follow-up visit as patient will be on long-term high-dose steroids.  We will schedule DEXA scan at the follow-up visit.  Orders: Orders Placed This Encounter  Procedures   CBC with Differential/Platelet   CK   COMPLETE METABOLIC PANEL WITH GFR   Ambulatory referral to Urology   Meds ordered this encounter  Medications   predniSONE (DELTASONE) 10 MG tablet    Sig: Take 4 tabs po qAM and 1 tab po qPM x 1 week, 4 tabs po qd x 1 week, 3.5 tabs po qd x 1 week, 3 tabs po qd x 1 week. Follow taper as discussed in the office.    Dispense:  109 tablet    Refill:  0    Face-to-face time spent with patient was 50 minutes. Greater than 50% of time was spent in counseling and coordination of care.  Follow-Up Instructions: Return in about 3 weeks (around 08/07/2022) for Inflammatory myopathy.   Pollyann Savoy, MD  Note - This record has been created using Animal nutritionist.  Chart creation errors have been sought, but may not always  have been located. Such creation errors do not reflect on  the standard of medical care.

## 2022-07-04 ENCOUNTER — Ambulatory Visit (HOSPITAL_BASED_OUTPATIENT_CLINIC_OR_DEPARTMENT_OTHER)
Admission: RE | Admit: 2022-07-04 | Discharge: 2022-07-04 | Disposition: A | Discharge status: Home / Self Care | Payer: BC Managed Care – PPO | Source: Ambulatory Visit | Attending: Internal Medicine | Admitting: Internal Medicine

## 2022-07-04 ENCOUNTER — Other Ambulatory Visit: Payer: Self-pay

## 2022-07-04 ENCOUNTER — Ambulatory Visit: Payer: BC Managed Care – PPO | Attending: Rheumatology

## 2022-07-04 DIAGNOSIS — M3320 Polymyositis, organ involvement unspecified: Secondary | ICD-10-CM | POA: Diagnosis not present

## 2022-07-04 DIAGNOSIS — J984 Other disorders of lung: Secondary | ICD-10-CM | POA: Diagnosis not present

## 2022-07-04 DIAGNOSIS — R1310 Dysphagia, unspecified: Secondary | ICD-10-CM | POA: Insufficient documentation

## 2022-07-04 DIAGNOSIS — R471 Dysarthria and anarthria: Secondary | ICD-10-CM | POA: Insufficient documentation

## 2022-07-04 NOTE — Patient Instructions (Signed)
  Swallow and speech evaluation If things are going well by 08-13-22 call and cancel the appt for the week of 08-20-22 or just cancel the evaluation altogether.

## 2022-07-04 NOTE — Therapy (Signed)
[facility] [address], [place], [place], [address] Phone: [phone]   Fax:  [phone]  Patient Details  Name: Cameron Kim MRN: [id number] Date of Birth: 1971-03-29 Referring Provider:  Bo Merino, MD   Encounter Date: 07/04/2022  ST - ARRIVE/CANCEL  SLP brought pt back to ST room and in converastion with pt learned that he is about to begin a regimen of Mycophenylate prescribed from [place] Monday.  It was agreed by pt and SLP that we will reschedule evaluation for week of 08-20-22, after he begins his third incr in the medication. If pt's sx have resolved by 08-13-22 to 08-20-22 SLP told pt he could call to cx evaluation if he sees no need for following through with it.  [facility], [place] 07/04/2022, 4:59 PM  [facility] [address], [place], [place], [address] Phone: [phone]   Fax:  [phone]

## 2022-07-06 ENCOUNTER — Other Ambulatory Visit: Payer: Self-pay | Admitting: Rheumatology

## 2022-07-09 ENCOUNTER — Telehealth: Payer: Self-pay

## 2022-07-09 ENCOUNTER — Other Ambulatory Visit: Payer: Self-pay

## 2022-07-09 DIAGNOSIS — R1310 Dysphagia, unspecified: Secondary | ICD-10-CM

## 2022-07-09 NOTE — Telephone Encounter (Signed)
Pt was made aware of Dr. Lyndel Safe recommendations Order for Modified Barium Swallow placed in Epic with Speech: Pt was made aware: Pt notified that if he has not heard anything from there office in two weeks please reach back out to our office so that we can check on the status of that:  Pt verbalized understanding with all questions answered.

## 2022-07-09 NOTE — Telephone Encounter (Signed)
Next Visit: 07/17/2022  Last Visit: 06/19/2022  Last Fill: 05/31/2022  Dx: Idiopathic inflammatory myopathy-Jo-1 Ab +, Ro Ab +  Current Dose per office lab on 06/19/2022: may reduce prednisone to 40 mg in the morning and 20 mg in the evening  Okay to refill Prednisone?

## 2022-07-11 ENCOUNTER — Other Ambulatory Visit (HOSPITAL_COMMUNITY): Payer: Self-pay

## 2022-07-11 DIAGNOSIS — R1310 Dysphagia, unspecified: Secondary | ICD-10-CM

## 2022-07-12 ENCOUNTER — Ambulatory Visit (HOSPITAL_BASED_OUTPATIENT_CLINIC_OR_DEPARTMENT_OTHER)
Admission: RE | Admit: 2022-07-12 | Discharge: 2022-07-12 | Disposition: A | Discharge status: Home / Self Care | Payer: BC Managed Care – PPO | Source: Ambulatory Visit | Attending: Family Medicine | Admitting: Family Medicine

## 2022-07-12 DIAGNOSIS — R634 Abnormal weight loss: Secondary | ICD-10-CM | POA: Diagnosis not present

## 2022-07-12 DIAGNOSIS — R7401 Elevation of levels of liver transaminase levels: Secondary | ICD-10-CM | POA: Diagnosis not present

## 2022-07-12 DIAGNOSIS — R945 Abnormal results of liver function studies: Secondary | ICD-10-CM | POA: Diagnosis not present

## 2022-07-12 NOTE — Progress Notes (Signed)
Informed pt of US results  

## 2022-07-17 ENCOUNTER — Encounter: Payer: Self-pay | Admitting: Rheumatology

## 2022-07-17 ENCOUNTER — Ambulatory Visit (INDEPENDENT_AMBULATORY_CARE_PROVIDER_SITE_OTHER): Payer: BC Managed Care – PPO | Admitting: Rheumatology

## 2022-07-17 VITALS — BP 135/82 | HR 102 | Resp 18 | Ht 74.0 in | Wt 227.0 lb

## 2022-07-17 DIAGNOSIS — M79641 Pain in right hand: Secondary | ICD-10-CM

## 2022-07-17 DIAGNOSIS — M79642 Pain in left hand: Secondary | ICD-10-CM

## 2022-07-17 DIAGNOSIS — I252 Old myocardial infarction: Secondary | ICD-10-CM

## 2022-07-17 DIAGNOSIS — R1319 Other dysphagia: Secondary | ICD-10-CM

## 2022-07-17 DIAGNOSIS — R768 Other specified abnormal immunological findings in serum: Secondary | ICD-10-CM

## 2022-07-17 DIAGNOSIS — M818 Other osteoporosis without current pathological fracture: Secondary | ICD-10-CM

## 2022-07-17 DIAGNOSIS — J849 Interstitial pulmonary disease, unspecified: Secondary | ICD-10-CM

## 2022-07-17 DIAGNOSIS — G7249 Other inflammatory and immune myopathies, not elsewhere classified: Secondary | ICD-10-CM | POA: Diagnosis not present

## 2022-07-17 DIAGNOSIS — D72828 Other elevated white blood cell count: Secondary | ICD-10-CM

## 2022-07-17 DIAGNOSIS — I452 Bifascicular block: Secondary | ICD-10-CM

## 2022-07-17 DIAGNOSIS — M25511 Pain in right shoulder: Secondary | ICD-10-CM

## 2022-07-17 DIAGNOSIS — Z7952 Long term (current) use of systemic steroids: Secondary | ICD-10-CM | POA: Diagnosis not present

## 2022-07-17 DIAGNOSIS — Z79899 Other long term (current) drug therapy: Secondary | ICD-10-CM | POA: Diagnosis not present

## 2022-07-17 DIAGNOSIS — G40909 Epilepsy, unspecified, not intractable, without status epilepticus: Secondary | ICD-10-CM

## 2022-07-17 DIAGNOSIS — T380X5A Adverse effect of glucocorticoids and synthetic analogues, initial encounter: Secondary | ICD-10-CM

## 2022-07-17 DIAGNOSIS — R600 Localized edema: Secondary | ICD-10-CM

## 2022-07-17 DIAGNOSIS — G8929 Other chronic pain: Secondary | ICD-10-CM

## 2022-07-17 DIAGNOSIS — I3139 Other pericardial effusion (noninflammatory): Secondary | ICD-10-CM

## 2022-07-17 DIAGNOSIS — Z86711 Personal history of pulmonary embolism: Secondary | ICD-10-CM

## 2022-07-17 DIAGNOSIS — E8889 Other specified metabolic disorders: Secondary | ICD-10-CM

## 2022-07-17 DIAGNOSIS — M25512 Pain in left shoulder: Secondary | ICD-10-CM

## 2022-07-17 DIAGNOSIS — Z86718 Personal history of other venous thrombosis and embolism: Secondary | ICD-10-CM

## 2022-07-17 MED ORDER — PREDNISONE 10 MG PO TABS: ORAL_TABLET | ORAL | 0 refills | Status: DC

## 2022-07-17 NOTE — Patient Instructions (Addendum)
Plan for prednisone taper if your labs stay stable: Prednisone 40 mg in the morning and 10 mg in the evening for 1 week 40 mg in the morning for 1 week  35 mg in the morning for 1 week 30 mg in the morning for 1 week 25 mg in the morning for 1 week 20 mg in the morning for 1 week 17.5 mg in the morning for 1 week 15 mg in the morning for 1 week 12.5 mg in the morning for 1 week 10 mg in the morning for 1 week 9 mg in the morning for 2 weeks 8 mg in the morning for 2 weeks 7 mg in the morning for 2 weeks 6 mg in the morning for 2 weeks 5 mg in the morning for 2 weeks 4 mg in the morning for 2 weeks 3 mg in the morning for 2 weeks 2 mg in the morning for 2 weeks 1 mg in the morning for 2 weeks   Take CellCept 500 mg twice a day for 2 weeks after lab work , if labs are normal then you will increase CellCept to 500 mg 2 tablets twice a day and repeat labs in 2 weeks after increasing the dose of CellCept.  Standing Labs We placed an order today for your standing lab work.   Please have your standing labs drawn in 2 weeks after starting CellCept x2  of then every 2 months.  Please have your labs drawn 2 weeks prior to your appointment so that the provider can discuss your lab results at your appointment.  Please note that you may see your imaging and lab results in [facility] before we have reviewed them. We will contact you once all results are reviewed. Please allow our office up to 72 hours to thoroughly review all of the results before contacting the office for clarification of your results.  Lab hours are: Monday through Thursday from 1:30 pm-4:30 pm and Friday from 1:30 pm- 4:00 pm  You may experience shorter wait times on Monday, Thursday or Friday afternoons,.   Effective August 06, 2022, new lab hours will be: Monday through Thursday from 8:00 am -12:30 pm and 1:00 pm-5:00 pm and Friday from 8:00 am-12:00 pm.  Please be advised, all patients with office appointments requiring  lab work will take precedent over walk-in lab work.   Labs are drawn by [facility]. Please bring your co-pay at the time of your lab draw.  You may receive a bill from [facility] for your lab work.  Please note if you are on Hydroxychloroquine and and an order has been placed for a Hydroxychloroquine level, you will need to have it drawn 4 hours or more after your last dose.  If you wish to have your labs drawn at another location, please call the office 24 hours in advance so we can fax the orders.  The office is located at [address] [place], [place], [place] [address] No appointment is necessary.    If you have any questions regarding directions or hours of operation,  please call [phone].   As a reminder, please drink plenty of water prior to coming for your lab work. Thanks!

## 2022-07-18 ENCOUNTER — Ambulatory Visit (HOSPITAL_COMMUNITY): Admission: RE | Admit: 2022-07-18 | Payer: BC Managed Care – PPO | Source: Ambulatory Visit

## 2022-07-18 ENCOUNTER — Ambulatory Visit (INDEPENDENT_AMBULATORY_CARE_PROVIDER_SITE_OTHER): Payer: BC Managed Care – PPO | Admitting: Family Medicine

## 2022-07-18 ENCOUNTER — Ambulatory Visit (HOSPITAL_COMMUNITY)
Admission: RE | Admit: 2022-07-18 | Discharge: 2022-07-18 | Disposition: A | Discharge status: Home / Self Care | Payer: BC Managed Care – PPO | Source: Ambulatory Visit | Attending: Family Medicine | Admitting: Family Medicine

## 2022-07-18 DIAGNOSIS — R1310 Dysphagia, unspecified: Secondary | ICD-10-CM

## 2022-07-18 DIAGNOSIS — Z111 Encounter for screening for respiratory tuberculosis: Secondary | ICD-10-CM | POA: Diagnosis not present

## 2022-07-18 NOTE — Progress Notes (Signed)
Patient presented today for Tb skin test, was offered the blood draw for ease pt declined stating multiple draws had come back "inconclusive" in the past as was encouraged to get skin test, patient will return apx 10:00am on Friday morning 48 hours after administration

## 2022-07-20 ENCOUNTER — Other Ambulatory Visit: Payer: BC Managed Care – PPO

## 2022-07-20 ENCOUNTER — Telehealth: Payer: Self-pay

## 2022-07-20 NOTE — Telephone Encounter (Signed)
Patient's chart noted.  

## 2022-07-20 NOTE — Telephone Encounter (Signed)
Patient was in office today and requested that I make you aware of his negative PPD test. Please let me know if you require anything else.

## 2022-07-20 NOTE — Telephone Encounter (Signed)
Good to know that patient's PPD is negative.  I think his CellCept is followed by [facility] rheumatology.  If he needs refill on CellCept from [place] then we should be able to fill.

## 2022-07-20 NOTE — Progress Notes (Signed)
Patient reported to the office to have TB skin test read on 07/20/22 at 10:16 am negative induration,  no other visual changes

## 2022-07-26 ENCOUNTER — Encounter (HOSPITAL_COMMUNITY): Payer: Self-pay

## 2022-07-26 DIAGNOSIS — R9349 Abnormal radiologic findings on diagnostic imaging of other urinary organs: Secondary | ICD-10-CM | POA: Diagnosis not present

## 2022-07-26 DIAGNOSIS — Z125 Encounter for screening for malignant neoplasm of prostate: Secondary | ICD-10-CM | POA: Diagnosis not present

## 2022-07-27 ENCOUNTER — Ambulatory Visit (HOSPITAL_COMMUNITY): Admission: RE | Admit: 2022-07-27 | Payer: BC Managed Care – PPO | Source: Ambulatory Visit

## 2022-07-27 ENCOUNTER — Ambulatory Visit (HOSPITAL_COMMUNITY): Payer: BC Managed Care – PPO

## 2022-07-27 LAB — SURGICAL PATHOLOGY

## 2022-07-30 ENCOUNTER — Ambulatory Visit (HOSPITAL_COMMUNITY): Payer: BC Managed Care – PPO | Attending: Cardiology

## 2022-07-30 DIAGNOSIS — M3320 Polymyositis, organ involvement unspecified: Secondary | ICD-10-CM

## 2022-07-30 DIAGNOSIS — I3139 Other pericardial effusion (noninflammatory): Secondary | ICD-10-CM | POA: Diagnosis not present

## 2022-07-30 LAB — ECHOCARDIOGRAM COMPLETE
Area-P 1/2: 3.48 cm2
S' Lateral: 3.1 cm

## 2022-07-30 NOTE — Progress Notes (Signed)
Office Visit Note  Patient: Cameron Kim             Date of Birth: [birth year]           MRN: [id number]             PCP: Sheliah Hatch, MD Referring: Sheliah Hatch, MD Visit Date: 07/31/2022 Occupation: @GUAROCC @  Subjective:  Medication monitoring   History of Present Illness: Tico is a 51 y.o. male with history of idiopathic myopathy and ILD.  Patient is currently taking CellCept to 1000 mg twice daily and is following a prednisone taper as prescribed.  He states that he took 1 month of Bactrim but has been out of the prescription for 1 week.  He has been tolerating CellCept without any side effects.  He states he has noticed some increased appetite while taking prednisone which has led to some weight gain.  He has noticed a significant improvement in his muscle weakness and joint stiffness on the current treatment regimen.  He has been able to start walking for exercise to improve his strength.  He denies any joint swelling at this time.  He denies any new rashes.  He had an echocardiogram performed yesterday on 07/30/2022. He has an upcoming appointment with Dr. 08/01/2022 on 08/08/22.  He denies any recent infections.   Activities of Daily Living:  Patient reports morning stiffness for 1 hour.   Patient Denies nocturnal pain.  Difficulty dressing/grooming: Reports Difficulty climbing stairs: Reports Difficulty getting out of chair: Denies Difficulty using hands for taps, buttons, cutlery, and/or writing: Denies  Review of Systems  Constitutional:  Positive for fatigue.  HENT:  Positive for mouth dryness. Negative for mouth sores.   Eyes:  Positive for dryness.  Respiratory:  Positive for shortness of breath.   Cardiovascular:  Positive for palpitations. Negative for chest pain.  Gastrointestinal:  Negative for blood in stool, constipation and diarrhea.  Endocrine: Negative for increased urination.  Genitourinary:  Negative for involuntary urination.   Musculoskeletal:  Positive for myalgias, morning stiffness, muscle tenderness and myalgias. Negative for joint pain, gait problem, joint pain, joint swelling and muscle weakness.  Skin:  Positive for rash. Negative for color change and sensitivity to sunlight.  Allergic/Immunologic: Negative for susceptible to infections.  Neurological:  Negative for dizziness and headaches.  Hematological:  Negative for swollen glands.  Psychiatric/Behavioral:  Negative for depressed mood and sleep disturbance. The patient is not nervous/anxious.     PMFS History:  Patient Active Problem List   Diagnosis Date Noted   Polymyositis, organ involvement unspecified (HCC) 06/24/2022   Interstitial lung disease (HCC) 06/24/2022   History of DVT (deep vein thrombosis) 05/28/2022   Low serum albumin 05/13/2022   Pericardial effusion 02/28/2022   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   Unintentional weight loss 02/11/2022   Lower extremity edema 02/11/2022   Dysarthria 02/11/2022   Weakness generalized 02/11/2022   Chronic pain of both shoulders 02/11/2022   Dry skin 02/11/2022   History of non-ST elevation myocardial infarction (NSTEMI) 02/11/2022   Acute deep vein thrombosis (DVT) of lower extremity (HCC) 05/02/2021   Seizure disorder (HCC) 11/07/2020   Epistaxis 06/15/2020   Erectile dysfunction 10/31/2017   Adjustment disorder with depressed mood 03/25/2017   Glucose intolerance 02/25/2017   Physical exam 12/03/2014   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 fascicular block 01/18/2014   Hx of pulmonary embolus 01/13/2014   Soft tissue mass 01/13/2014   Overweight (BMI 25.0-29.9) 01/13/2014   Allergic  rhinitis 01/13/2014    Past Medical History:  Diagnosis Date   Abnormal EKG    Ankle fracture, left    Dysrhythmia    extra beat or skips a beat   Peripheral vascular disease ([place]) 2013   dvt-pe after ankle fx   Pulmonary embolism ([place]) 05/2012   DVT traveled to lung   Seizure disorder ([place])  11/07/2020   had 2 but none since not on seizure meds    Family History  Problem Relation Age of Onset   Diabetes Mother    Hypertension Mother    Heart murmur Mother    Stroke Mother    Diabetes Father    Hypertension Father    ALS Father    Stroke Sister    Rheum arthritis Brother    Diabetes Paternal Grandmother    Diabetes Paternal Grandfather    Past Surgical History:  Procedure Laterality Date   CARDIAC CATHETERIZATION  2021   I & D EXTREMITY Right 06/07/2016   Procedure: IRRIGATION AND DEBRIDEMENT RIGHT WRIST,MEDIAN NERVE EXPLORATION NEUROLYSIS AND REPAIR;  Surgeon: Roseanne Kaufman, MD;  Location: [facility];  Service: Orthopedics;  Laterality: Right;   medial nerve surgry right arm     MUSCLE BIOPSY Right 07/02/2022   Procedure: RIGHT THIGH MUSCLE BIOPSY;  Surgeon: [name], MD;  Location: [facility];  Service: General;  Laterality: Right;   Social History   Social History Narrative   Not on file   Immunization History  Administered Date(s) Administered   Influenza,inj,Quad PF,6+ Mos 09/01/2018   Moderna SARS-COV2 Booster Vaccination 11/05/2020   PPD Test 07/18/2022   Tdap 10/08/2004, 12/03/2014     Objective: Vital Signs: BP (!) 152/85 (BP Location: Left Arm, Patient Position: Sitting, Cuff Size: Large)   Pulse 83   Resp 18   Ht 6\' 2"  (1.88 m)   Wt 239 lb 6.4 oz (108.6 kg)   BMI 30.74 kg/m    Physical Exam Vitals and nursing note reviewed.  Constitutional:      Appearance: He is well-developed.  HENT:     Head: Normocephalic and atraumatic.  Eyes:     Conjunctiva/sclera: Conjunctivae normal.     Pupils: Pupils are equal, round, and reactive to light.  Cardiovascular:     Rate and Rhythm: Normal rate and regular rhythm.     Heart sounds: Normal heart sounds.  Pulmonary:     Effort: Pulmonary effort is normal.     Breath sounds: Normal breath sounds.  Abdominal:     General: Bowel sounds are normal.     Palpations: Abdomen is soft.  Musculoskeletal:      Cervical back: Normal range of motion and neck supple.  Skin:    General: Skin is warm and dry.     Capillary Refill: Capillary refill takes less than 2 seconds.  Neurological:     Mental Status: He is alert and oriented to person, place, and time.  Psychiatric:        Behavior: Behavior normal.      Musculoskeletal Exam: C-spine has good range of motion.  Shoulder abduction slightly limited especially in the right shoulder.  Some tenderness over the right shoulder joint.  Elbow joints, wrist joints, MCPs, PIPs, DIPs have good range of motion with no synovitis.  Complete fist formation bilaterally.  Tenderness over the second through fourth PIP joints.  Hip joints have good range of motion with no groin pain.  Knee joints have good range of motion with no warmth or  effusion.  Ankle joints have good range of motion with no tenderness or joint swelling. Muscle strength 4+ out of 5 in all extremities.   CDAI Exam: CDAI Score: -- Patient Global: --; Provider Global: -- Swollen: --; Tender: -- Joint Exam 07/31/2022   No joint exam has been documented for this visit   There is currently no information documented on the homunculus. Go to the Rheumatology activity and complete the homunculus joint exam.  Investigation: No additional findings.  Imaging: ECHOCARDIOGRAM COMPLETE  Result Date: 07/30/2022    ECHOCARDIOGRAM REPORT   Patient Name:   Rafi Kim  Date of Exam: 07/30/2022 Medical Rec #:  [id number]     Height:       74.0 in Accession #:    [id number]    Weight:       227.0 lb Date of Birth:  1971/07/05     BSA:          2.294 m Patient Age:    51 years      BP:           135/80 mmHg Patient Gender: M             HR:           81 bpm. Exam Location:  [address] Procedure: 2D Echo, 3D Echo, Cardiac Doppler, Color Doppler and Strain Analysis Indications:    R06.00 Dyspnea  History:        Patient has prior history of Echocardiogram examinations, most                 recent  02/22/2022. Previous Myocardial Infarction,                 Arrythmias:RBBB and PVC; Signs/Symptoms:Edema. DVT. Pericardial                 effusion. H/o pulmonary embolus.  Sonographer:    Jorje Guild BS, RDCS Referring Phys: [id number] MURALI RAMASWAMY IMPRESSIONS  1. Left ventricular ejection fraction, by estimation, is 60 to 65%. Left ventricular ejection fraction by 3D volume is 60 %. The left ventricle has normal function. The left ventricle has no regional wall motion abnormalities. Left ventricular diastolic  parameters were normal. The average left ventricular global longitudinal strain is -28.8 %. The global longitudinal strain is normal.  2. Right ventricular systolic function is normal. The right ventricular size is normal. There is moderately elevated pulmonary artery systolic pressure. The estimated right ventricular systolic pressure is 47.3 mmHg.  3. The mitral valve is normal in structure. Trivial mitral valve regurgitation.  4. The aortic valve was not well visualized. Aortic valve regurgitation is not visualized. No aortic stenosis is present.  5. The inferior vena cava is dilated in size with <50% respiratory variability, suggesting right atrial pressure of 15 mmHg. Comparison(s): 02/22/22 EF 60-65%. PA pressure . FINDINGS  Left Ventricle: Left ventricular ejection fraction, by estimation, is 60 to 65%. Left ventricular ejection fraction by 3D volume is 60 %. The left ventricle has normal function. The left ventricle has no regional wall motion abnormalities. The average left ventricular global longitudinal strain is -28.8 %. The global longitudinal strain is normal. The left ventricular internal cavity size was normal in size. There is no left ventricular hypertrophy. Left ventricular diastolic parameters were normal. Right Ventricle: The right ventricular size is normal. No increase in right ventricular wall thickness. Right ventricular systolic function is normal. There is moderately elevated  pulmonary artery systolic pressure. The tricuspid regurgitant velocity  is 2.84 m/s, and with an assumed right atrial pressure of 15 mmHg, the estimated right ventricular systolic pressure is 47.3 mmHg. Left Atrium: Left atrial size was normal in size. Right Atrium: Right atrial size was normal in size. Pericardium: There is no evidence of pericardial effusion. Mitral Valve: The mitral valve is normal in structure. Trivial mitral valve regurgitation. Tricuspid Valve: The tricuspid valve is normal in structure. Tricuspid valve regurgitation is trivial. Aortic Valve: The aortic valve was not well visualized. Aortic valve regurgitation is not visualized. No aortic stenosis is present. Pulmonic Valve: The pulmonic valve was not well visualized. Pulmonic valve regurgitation is not visualized. Aorta: The aortic root and ascending aorta are structurally normal, with no evidence of dilitation. Venous: The inferior vena cava is dilated in size with less than 50% respiratory variability, suggesting right atrial pressure of 15 mmHg. IAS/Shunts: The interatrial septum was not well visualized.  LEFT VENTRICLE PLAX 2D LVIDd:         4.80 cm         Diastology LVIDs:         3.10 cm         LV e' medial:    8.41 cm/s LV PW:         0.90 cm         LV E/e' medial:  10.2 LV IVS:        0.90 cm         LV e' lateral:   9.64 cm/s LVOT diam:     2.30 cm         LV E/e' lateral: 8.9 LV SV:         136 LV SV Index:   59              2D LVOT Area:     4.15 cm        Longitudinal                                Strain                                2D Strain GLS  -30.5 %                                (A2C):                                2D Strain GLS  -26.9 %                                (A3C):                                2D Strain GLS  -29.1 %                                (A4C):                                2D Strain GLS  -28.8 %  Avg:                                 3D Volume EF                                 LV 3D EF:    Left                                             ventricul                                             ar                                             ejection                                             fraction                                             by 3D                                             volume is                                             60 %.                                 3D Volume EF:                                3D EF:        60 %                                LV EDV:       172 ml                                LV ESV:       69 ml                                LV SV:        103 ml RIGHT VENTRICLE  IVC RV Basal diam:  3.70 cm     IVC diam: 2.20 cm RV S prime:     15.70 cm/s TAPSE (M-mode): 3.0 cm RVSP:           40.3 mmHg LEFT ATRIUM             Index        RIGHT ATRIUM           Index LA diam:        4.10 cm 1.79 cm/m   RA Pressure: 8.00 mmHg LA Vol (A2C):   55.4 ml 24.15 ml/m  RA Area:     16.70 cm LA Vol (A4C):   78.3 ml 34.14 ml/m  RA Volume:   40.80 ml  17.79 ml/m LA Biplane Vol: 67.1 ml 29.25 ml/m  AORTIC VALVE LVOT Vmax:   165.00 cm/s LVOT Vmean:  113.000 cm/s LVOT VTI:    0.327 m  AORTA Ao Root diam: 3.50 cm Ao Asc diam:  3.20 cm MITRAL VALVE                TRICUSPID VALVE                             TR Peak grad:   32.3 mmHg MV Decel Time: 218 msec     TR Vmax:        284.00 cm/s MV E velocity: 85.90 cm/s   Estimated RAP:  8.00 mmHg MV A velocity: 105.00 cm/s  RVSP:           40.3 mmHg MV E/A ratio:  0.82                             SHUNTS                             Systemic VTI:  0.33 m                             Systemic Diam: 2.30 cm Epifanio Lescheshristopher Schumann MD Electronically signed by Epifanio Lescheshristopher Schumann MD Signature Date/Time: 07/30/2022/10:10:44 AM    Final    US Abdomen Limited RUQ (LIVER/GB)  Result Date: 07/12/2022 CLINICAL DATA:  Elevated LFTs.  Weight loss.  Fatigue. EXAM: ULTRASOUND ABDOMEN LIMITED RIGHT UPPER QUADRANT  COMPARISON:  None Available. FINDINGS: Gallbladder: Evaluation of the gallbladder is limited due to lack of distention. Within this limitation, the gallbladder is normal in appearance. Common bile duct: Diameter: 3.5 mm Liver: No focal lesion identified. Within normal limits in parenchymal echogenicity. Portal vein is patent on color Doppler imaging with normal direction of blood flow towards the liver. Other: None. IMPRESSION: 1. Evaluation of the gallbladder is limited due to lack of distention. Within this limitation, the gallbladder is normal in appearance. 2. No other abnormalities. Electronically Signed   By: Gerome Samavid  Williams III M.D.   On: 07/12/2022 11:27   CT Chest High Resolution  Result Date: 07/06/2022 CLINICAL DATA:  51 year old male history of polymyositis. Evaluate for interstitial lung disease. EXAM: CT CHEST WITHOUT CONTRAST TECHNIQUE: Multidetector CT imaging of the chest was performed following the standard protocol without intravenous contrast. High resolution imaging of the lungs, as well as inspiratory and expiratory imaging, was performed. RADIATION DOSE REDUCTION: This exam was performed according to the departmental dose-optimization program which  includes automated exposure control, adjustment of the mA and/or kV according to patient size and/or use of iterative reconstruction technique. COMPARISON:  CT of the chest 06/08/2022. FINDINGS: Cardiovascular: Heart size is normal. Trace amount of pericardial fluid and/or thickening, unlikely to be of any hemodynamic significance at this time. No associated pericardial calcification. There is aortic atherosclerosis, as well as atherosclerosis of the great vessels of the mediastinum and the coronary arteries, including calcified atherosclerotic plaque in the left anterior descending coronary artery. Mediastinum/Nodes: No pathologically enlarged mediastinal or hilar lymph nodes. Please note that accurate exclusion of hilar adenopathy is limited  on noncontrast CT scans. Esophagus is unremarkable in appearance. No axillary lymphadenopathy. Lungs/Pleura: High-resolution images demonstrate widespread areas of ground-glass attenuation, septal thickening, thickening of the peribronchovascular interstitium, mild cylindrical traction bronchiectasis and peripheral bronchiolectasis in the lungs bilaterally, most evident in the mid to lower lungs. No frank honeycombing. Inspiratory and expiratory imaging is unremarkable. No acute consolidative airspace disease. No pleural effusions. No definite suspicious appearing pulmonary nodules or masses are noted. Upper Abdomen: Unremarkable. Musculoskeletal: There are no aggressive appearing lytic or blastic lesions noted in the visualized portions of the skeleton. IMPRESSION: 1. The appearance of the lungs is indicative of interstitial lung disease, with a spectrum of findings considered probable usual interstitial pneumonia (UIP) per current ATS guidelines. Repeat high-resolution chest CT is recommended in 12 months to assess for temporal changes in the appearance of the lung parenchyma. 2. Aortic atherosclerosis, in addition to left anterior descending coronary artery disease. Please note that although the presence of coronary artery calcium documents the presence of coronary artery disease, the severity of this disease and any potential stenosis cannot be assessed on this non-gated CT examination. Assessment for potential risk factor modification, dietary therapy or pharmacologic therapy may be warranted, if clinically indicated. Aortic Atherosclerosis (ICD10-I70.0). Electronically Signed   By: Trudie Reed M.D.   On: 07/06/2022 06:57    Recent Labs: Lab Results  Component Value Date   WBC 15.4 (H) 06/26/2022   HGB 13.7 06/26/2022   PLT 221.0 06/26/2022   NA 142 06/19/2022   K 4.0 06/19/2022   CL 104 06/19/2022   CO2 29 06/19/2022   GLUCOSE 97 06/19/2022   BUN 29 (H) 06/19/2022   CREATININE 0.87  06/19/2022   BILITOT 0.4 05/25/2022   ALKPHOS 60 05/25/2022   AST 92 (H) 05/25/2022   ALT 31 05/25/2022   PROT 7.8 05/31/2022   ALBUMIN 2.4 (L) 05/25/2022   CALCIUM 9.3 06/19/2022   GFRAA >60 06/06/2016   QFTBGOLDPLUS INDETERMINATE (A) 06/20/2022    Speciality Comments: PPD Negative 07/20/2022  Procedures:  No procedures performed Allergies: Patient has no known allergies.   Assessment / Plan:     Visit Diagnoses: Idiopathic inflammatory myopathy-Jo-1 Ab +, Ro Ab + - elevated CK, aldolase, positive ANA, positive RF, positive anti-CCP, severe muscle weakness with dysphagia, dysphonia and dysarthria, mechanics hands: Patient is currently taking CellCept 1000 mg twice daily without any side effects or interruptions.  He is following the prednisone taper as prescribed and plans to continue to taper by 5 mg every week until he reaches 20 mg at which time he will reduce by 2.5 mg every week until he reaches 10 mg at which time he will reduce by 1 mg every 2 weeks.  He has been tolerating the prednisone taper as prescribed and has been following the calendar provided with the dosing schedule.  He has not noticed any new or worsening symptoms since starting  to taper prednisone.  He continues to notice improvement in his symptoms including increased strength.  He has been starting to walk for exercise and has noticed some weight gain.  He has not had any infections since initiating CellCept.  He took 1 month of Bactrim 3 days a week but has ran out of the prescription.  A refill of Bactrim was sent to the pharmacy today.  He will remain on CellCept, Bactrim, and prednisone as prescribed.  He was advised to notify us if he develops any signs or symptoms of a flare.  He will continue to follow-up with [facility] rheumatology as well.  He will follow-up in our office in 1 month or sooner if needed.- Plan: CK  Long term (current) use of systemic steroids - Tapering prednisone by 5 mg every week until reaching 20  mg daily then will taper by 2.5 mg every week until reaching 10 mg and then taper by 1 mg every 2 weeks as tolerated.   He has noticed an increase in his appetite while taking prednisone as well as some weight gain.  He is otherwise tolerating prednisone without any side effects. CBC and CMP updated today.  High risk medication use - Cellcept 1 g twice daily, Bactrim 3 days weekly, and prednisone taper as discussed above.  CBC and CMP were drawn today along with CK.  TB Gold test was indeterminate x2.  - Plan: CBC with Differential/Platelet, COMPLETE METABOLIC PANEL WITH GFR  TPMT intermediate metabolizer (HCC) - TPMT was 6, low metabolizer  Interstitial lung disease (HCC) - Dr. Marchelle Gearing.  High-resolution CT on 07/04/22: showed features of UIP.  Echocardiogram ordered by Dr. Marchelle Gearing and performed yesterday on 07/30/2022: Left ventricle ejection fraction was 60 to 65%.  Left ventricle has normal function.  Right ventricle systolic function is normal.  Moderately elevated pulmonary artery systolic pressure. PFTs scheduled for 08/10/22.  Upcoming appointment on 08/28/22 with Dr. Marchelle Gearing.  Continue current dose of cellcept and prednisone taper as prescribed.   Other dysphagia - Referred to Dr. Vassie Moselle appointment on 08/08/22.   Chronic pain of both shoulders - Dr. August Saucer.  Right rotator cuff tear-limited abduction of both shoulders, especially the right shoulder.    Pain in both hands - Pain and mechanic hands resolved with prednisone use. He has some tenderness of the right 2nd, 3rd, and 4th PIP joints.  No synovitis noted.   Steroid-induced osteoporosis: DEXA order placed today. We will discuss adding on fosamax at his follow up visit.   History of DVT (deep vein thrombosis) - Anticardiolipin antibody and beta-2 GP 1 were negative.  He has positive ANA and positive Ro antibody.  He is on Eliquis.  Hx of pulmonary embolus: He remains on eliquis as prescribed.    Leukocytosis/thrombocytosis:  Longstanding history of leukocytosis and thrombocytosis.  CBC with diff updated today.   Other medical conditions are listed as follows:   History of non-ST elevation myocardial infarction (NSTEMI)  Pericardial effusion  Lower extremity edema  Seizure disorder (HCC)  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 fascicular block    Orders: Orders Placed This Encounter  Procedures   DG BONE DENSITY (DXA)   CBC with Differential/Platelet   COMPLETE METABOLIC PANEL WITH GFR   CK   Meds ordered this encounter  Medications   sulfamethoxazole-trimethoprim (BACTRIM DS) 800-160 MG tablet    Sig: Take 1 tablet by mouth 3 (three) times a week.    Dispense:  36 tablet    Refill:  0     Follow-Up Instructions: Return in about 4 weeks (around 08/28/2022) for Myopathy, ILD.   Gearldine Bienenstock, PA-C  Note - This record has been created using Dragon software.  Chart creation errors have been sought, but may not always  have been located. Such creation errors do not reflect on  the standard of medical care.

## 2022-07-31 ENCOUNTER — Ambulatory Visit: Payer: BC Managed Care – PPO | Attending: Physician Assistant | Admitting: Physician Assistant

## 2022-07-31 ENCOUNTER — Encounter: Payer: Self-pay | Admitting: Physician Assistant

## 2022-07-31 VITALS — BP 152/85 | HR 83 | Resp 18 | Ht 74.0 in | Wt 239.4 lb

## 2022-07-31 DIAGNOSIS — Z86711 Personal history of pulmonary embolism: Secondary | ICD-10-CM

## 2022-07-31 DIAGNOSIS — G40909 Epilepsy, unspecified, not intractable, without status epilepticus: Secondary | ICD-10-CM

## 2022-07-31 DIAGNOSIS — R1319 Other dysphagia: Secondary | ICD-10-CM

## 2022-07-31 DIAGNOSIS — Z79899 Other long term (current) drug therapy: Secondary | ICD-10-CM | POA: Diagnosis not present

## 2022-07-31 DIAGNOSIS — I3139 Other pericardial effusion (noninflammatory): Secondary | ICD-10-CM

## 2022-07-31 DIAGNOSIS — T380X5A Adverse effect of glucocorticoids and synthetic analogues, initial encounter: Secondary | ICD-10-CM

## 2022-07-31 DIAGNOSIS — Z13820 Encounter for screening for osteoporosis: Secondary | ICD-10-CM

## 2022-07-31 DIAGNOSIS — Z7952 Long term (current) use of systemic steroids: Secondary | ICD-10-CM

## 2022-07-31 DIAGNOSIS — J849 Interstitial pulmonary disease, unspecified: Secondary | ICD-10-CM

## 2022-07-31 DIAGNOSIS — Z86718 Personal history of other venous thrombosis and embolism: Secondary | ICD-10-CM

## 2022-07-31 DIAGNOSIS — G8929 Other chronic pain: Secondary | ICD-10-CM

## 2022-07-31 DIAGNOSIS — M25512 Pain in left shoulder: Secondary | ICD-10-CM

## 2022-07-31 DIAGNOSIS — M818 Other osteoporosis without current pathological fracture: Secondary | ICD-10-CM

## 2022-07-31 DIAGNOSIS — D72828 Other elevated white blood cell count: Secondary | ICD-10-CM

## 2022-07-31 DIAGNOSIS — E8889 Other specified metabolic disorders: Secondary | ICD-10-CM | POA: Diagnosis not present

## 2022-07-31 DIAGNOSIS — I452 Bifascicular block: Secondary | ICD-10-CM

## 2022-07-31 DIAGNOSIS — R600 Localized edema: Secondary | ICD-10-CM

## 2022-07-31 DIAGNOSIS — M79642 Pain in left hand: Secondary | ICD-10-CM

## 2022-07-31 DIAGNOSIS — G7249 Other inflammatory and immune myopathies, not elsewhere classified: Secondary | ICD-10-CM

## 2022-07-31 DIAGNOSIS — I252 Old myocardial infarction: Secondary | ICD-10-CM

## 2022-07-31 DIAGNOSIS — M25511 Pain in right shoulder: Secondary | ICD-10-CM

## 2022-07-31 DIAGNOSIS — M79641 Pain in right hand: Secondary | ICD-10-CM

## 2022-07-31 MED ORDER — SULFAMETHOXAZOLE-TRIMETHOPRIM 800-160 MG PO TABS: 1.0000 | ORAL_TABLET | ORAL | 0 refills | Status: DC

## 2022-07-31 NOTE — Progress Notes (Signed)
CBC stable.  WBC count remains elevated.  Patient remains on prednisone.

## 2022-08-01 LAB — CBC WITH DIFFERENTIAL/PLATELET
Absolute Monocytes: 1220 cells/uL — ABNORMAL HIGH (ref 200–950)
Basophils Absolute: 86 cells/uL (ref 0–200)
Basophils Relative: 0.4 %
Eosinophils Absolute: 0 cells/uL — ABNORMAL LOW (ref 15–500)
Eosinophils Relative: 0 %
HCT: 44.5 % (ref 38.5–50.0)
Hemoglobin: 14.9 g/dL (ref 13.2–17.1)
Lymphs Abs: 1477 cells/uL (ref 850–3900)
MCH: 31.3 pg (ref 27.0–33.0)
MCHC: 33.5 g/dL (ref 32.0–36.0)
MCV: 93.5 fL (ref 80.0–100.0)
MPV: 11 fL (ref 7.5–12.5)
Monocytes Relative: 5.7 %
Neutro Abs: 18618 cells/uL — ABNORMAL HIGH (ref 1500–7800)
Neutrophils Relative %: 87 %
Platelets: 261 10*3/uL (ref 140–400)
RBC: 4.76 10*6/uL (ref 4.20–5.80)
RDW: 15.8 % — ABNORMAL HIGH (ref 11.0–15.0)
Total Lymphocyte: 6.9 %
WBC: 21.4 10*3/uL — ABNORMAL HIGH (ref 3.8–10.8)

## 2022-08-01 LAB — COMPLETE METABOLIC PANEL WITH GFR
AG Ratio: 1.2 (calc) (ref 1.0–2.5)
ALT: 16 U/L (ref 9–46)
AST: 13 U/L (ref 10–35)
Albumin: 3.9 g/dL (ref 3.6–5.1)
Alkaline phosphatase (APISO): 71 U/L (ref 35–144)
BUN: 21 mg/dL (ref 7–25)
CO2: 30 mmol/L (ref 20–32)
Calcium: 9.3 mg/dL (ref 8.6–10.3)
Chloride: 103 mmol/L (ref 98–110)
Creat: 0.83 mg/dL (ref 0.70–1.30)
Globulin: 3.3 g/dL (calc) (ref 1.9–3.7)
Glucose, Bld: 77 mg/dL (ref 65–99)
Potassium: 4.5 mmol/L (ref 3.5–5.3)
Sodium: 143 mmol/L (ref 135–146)
Total Bilirubin: 0.5 mg/dL (ref 0.2–1.2)
Total Protein: 7.2 g/dL (ref 6.1–8.1)
eGFR: 106 mL/min/{1.73_m2} (ref >=60)

## 2022-08-01 LAB — CK: Total CK: 265 U/L — ABNORMAL HIGH (ref 44–196)

## 2022-08-01 NOTE — Progress Notes (Signed)
CMP WNL.   CK continues to trend down- remains slightly elevated-265.   Continue on current treatment regimen.

## 2022-08-08 ENCOUNTER — Encounter: Payer: Self-pay | Admitting: Gastroenterology

## 2022-08-08 ENCOUNTER — Telehealth: Payer: Self-pay

## 2022-08-08 ENCOUNTER — Ambulatory Visit (INDEPENDENT_AMBULATORY_CARE_PROVIDER_SITE_OTHER): Payer: BC Managed Care – PPO | Admitting: Gastroenterology

## 2022-08-08 VITALS — BP 116/60 | HR 104 | Ht 72.0 in | Wt 240.5 lb

## 2022-08-08 DIAGNOSIS — Z1211 Encounter for screening for malignant neoplasm of colon: Secondary | ICD-10-CM

## 2022-08-08 DIAGNOSIS — Z7902 Long term (current) use of antithrombotics/antiplatelets: Secondary | ICD-10-CM | POA: Diagnosis not present

## 2022-08-08 DIAGNOSIS — R1314 Dysphagia, pharyngoesophageal phase: Secondary | ICD-10-CM | POA: Diagnosis not present

## 2022-08-08 MED ORDER — NA SULFATE-K SULFATE-MG SULF 17.5-3.13-1.6 GM/177ML PO SOLN: 1.0000 | Freq: Once | ORAL | 0 refills | Status: AC

## 2022-08-08 NOTE — Telephone Encounter (Signed)
Pre-operative Risk Assessment     Request for surgical clearance:     Endoscopy Procedure  What type of surgery is being performed?     Colonoscopy  When is this surgery scheduled?     08-23-2022  What type of clearance is required ?   Pharmacy  Are there any medications that need to be held prior to surgery and how long? Yes, Eliquis 2 days  Practice name and name of physician performing surgery?      [facility]  What is your office phone and fax number?      Phone- [phone]  [phone]  Anesthesia type (None, local, MAC, general) ?       MAC

## 2022-08-08 NOTE — Progress Notes (Signed)
[facility] Consult Note:  History: Cameron Kim 08/08/2022  Referring provider: Midge Minium, MD  Reason for consult/chief complaint: Dysphagia (To discuss having a ECL) and Colon Cancer Screening   Subjective  HPI:  Cameron Kim was referred to [place] for a screening colonoscopy in 2021.  He has a seizure disorder and had suffered a seizure relatively soon prior to that scheduled visit, and after review with our anesthesia staff, the recommendation was to defer this colonoscopy for at least 6 months afterward.  He was recently referred back to [place] by his PCP for that screening colonoscopy. There is also a nurse note from our office a month ago indicating some advice was received from Dr. Lyndel Safe regarding dysphagia and a referral to speech pathology, and this seems to have been after a referral from his rheumatologist for dysphagia.  (MBS ordered but canceled) That specialist is seeing  Cameron Kim for an inflammatory myopathy for which he recently had a muscle biopsy.  Recent high-resolution chest CT scan indicates interstitial lung disease as well as atherosclerotic disease including coronary arteries.  (Report below)  Cameron Kim tells me that with his generalized muscle weakness and fatigue he was also having difficulty swallowing.  He describes that as having been difficulty even just passing saliva and liquid from his mouth of the throat, and would feel like it was slow to pass in the throat.  He was put on prednisone and mycophenolate for his myositis, and with that his muscle strength is improving and his swallowing significantly improving as well.  He was not having food or liquid feel stuck in the chest with the need to bring it back up.  He denies heartburn, dysphagia odynophagia nausea or vomiting. Bowel habits are regular, there has been some intermittent loose stools since starting some new meds.  He denies rectal bleeding.  ROS:  Review of Systems  Constitutional:   Positive for fatigue. Negative for appetite change and unexpected weight change.  HENT:  Negative for mouth sores and voice change.   Eyes:  Negative for pain and redness.  Respiratory:  Negative for cough and shortness of breath.   Cardiovascular:  Negative for chest pain and palpitations.  Genitourinary:  Negative for dysuria and hematuria.  Musculoskeletal:  Positive for myalgias. Negative for arthralgias.       Muscle weakness  Skin:  Negative for pallor and rash.  Neurological:  Negative for weakness and headaches.  Hematological:  Negative for adenopathy.     Past Medical History: Past Medical History:  Diagnosis Date   Abnormal EKG    Ankle fracture, left    DVT (deep venous thrombosis) (HCC)    Dysrhythmia    extra beat or skips a beat   Idiopathic inflammatory myopathy    Peripheral vascular disease ([place]) 2013   dvt-pe after ankle fx   Polymyositis (HCC)    Pulmonary embolism ([place]) 05/2012   DVT traveled to lung   Seizure disorder ([place]) 11/07/2020   had 2 but none since not on seizure meds     Past Surgical History: Past Surgical History:  Procedure Laterality Date   CARDIAC CATHETERIZATION  2021   I & D EXTREMITY Right 06/07/2016   Procedure: IRRIGATION AND DEBRIDEMENT RIGHT WRIST,MEDIAN NERVE EXPLORATION NEUROLYSIS AND REPAIR;  Surgeon: Roseanne Kaufman, MD;  Location: [facility];  Service: Orthopedics;  Laterality: Right;   medial nerve surgry right arm     MUSCLE BIOPSY Right 07/02/2022   Procedure: RIGHT THIGH MUSCLE BIOPSY;  Surgeon:  Jovita Kussmaul, MD;  Location: [facility];  Service: General;  Laterality: Right;     Family History: Family History  Problem Relation Age of Onset   Diabetes Mother    Hypertension Mother    Heart murmur Mother    Stroke Mother    Diabetes Father    Hypertension Father    ALS Father    Stroke Sister    Rheum arthritis Brother    Diabetes Paternal Grandmother    Diabetes Paternal Grandfather     Social History: Social  History   Socioeconomic History   Marital status: Married    Spouse name: Not on file   Number of children: 0   Years of education: Not on file   Highest education level: Not on file  Occupational History   Occupation: disabled  Tobacco Use   Smoking status: Never    Passive exposure: Never   Smokeless tobacco: Never  Vaping Use   Vaping Use: Never used  Substance and Sexual Activity   Alcohol use: Not Currently   Drug use: No   Sexual activity: Yes  Other Topics Concern   Not on file  Social History Narrative   Not on file   Social Determinants of Health   Financial Resource Strain: Not on file  Food Insecurity: Not on file  Transportation Needs: Not on file  Physical Activity: Not on file  Stress: Not on file  Social Connections: Not on file    Allergies: No Known Allergies  Outpatient Meds: Current Outpatient Medications  Medication Sig Dispense Refill   apixaban (ELIQUIS) 5 MG TABS tablet Take 1 tablet (5 mg total) by mouth 2 (two) times daily. 60 tablet 3   Cyanocobalamin (VITAMIN B-12 PO) Take by mouth daily.     FOLIC ACID PO Take 1 tablet by mouth daily.     mycophenolate (CELLCEPT) 500 MG tablet Take 1,000 mg by mouth 2 (two) times daily.     Na Sulfate-K Sulfate-Mg Sulf 17.5-3.13-1.6 GM/177ML SOLN Take 1 kit by mouth once for 1 dose. 354 mL 0   oxyCODONE (ROXICODONE) 5 MG immediate release tablet Take 1 tablet (5 mg total) by mouth every 6 (six) hours as needed for severe pain. 10 tablet 0   predniSONE (DELTASONE) 10 MG tablet Take 4 tabs po qAM and 1 tab po qPM x 1 week, 4 tabs po qd x 1 week, 3.5 tabs po qd x 1 week, 3 tabs po qd x 1 week. Follow taper as discussed in the office. 109 tablet 0   sulfamethoxazole-trimethoprim (BACTRIM DS) 800-160 MG tablet Take 1 tablet by mouth 3 (three) times a week. 36 tablet 0   No current facility-administered medications for this visit.   Prednisone dosing is currently  tapering.   ___________________________________________________________________ Objective   Exam:  BP 116/60 (BP Location: Left Arm, Patient Position: Sitting, Cuff Size: Large)   Pulse (!) 104   Ht 6' (1.829 m) Comment: height measured without shoes  Wt 240 lb 8 oz (109.1 kg)   BMI 32.62 kg/m  Wt Readings from Last 3 Encounters:  08/08/22 240 lb 8 oz (109.1 kg)  07/31/22 239 lb 6.4 oz (108.6 kg)  07/17/22 227 lb (103 kg)    General: Well-appearing, normal vocal quality.  Able to get on exam table independently Eyes: sclera anicteric, no redness ENT: oral mucosa moist without lesions, no cervical or supraclavicular lymphadenopathy CV: Regular without murmur, no JVD, no peripheral edema Resp: clear to auscultation bilaterally, normal RR  and effort noted GI: soft, no tenderness, with active bowel sounds. No guarding or palpable organomegaly noted. Skin; warm and dry, no rash or jaundice noted Neuro: awake, alert and oriented x 3. Normal gross motor function and fluent speech  Labs:  Neg FOBT May 2023     Latest Ref Rng & Units 07/31/2022    8:40 AM 06/26/2022   12:26 PM 05/25/2022    3:07 PM  CBC  WBC 3.8 - 10.8 Thousand/uL 21.4  15.4  17.6   Hemoglobin 13.2 - 17.1 g/dL 14.9  13.7  12.0   Hematocrit 38.5 - 50.0 % 44.5  42.9  37.0   Platelets 140 - 400 Thousand/uL 261  221.0  455      Radiologic Studies:  CLINICAL DATA:  51 year old male history of polymyositis. Evaluate for interstitial lung disease.   EXAM: CT CHEST WITHOUT CONTRAST   TECHNIQUE: Multidetector CT imaging of the chest was performed following the standard protocol without intravenous contrast. High resolution imaging of the lungs, as well as inspiratory and expiratory imaging, was performed.   RADIATION DOSE REDUCTION: This exam was performed according to the departmental dose-optimization program which includes automated exposure control, adjustment of the mA and/or kV according to patient  size and/or use of iterative reconstruction technique.   COMPARISON:  CT of the chest 06/08/2022.   FINDINGS: Cardiovascular: Heart size is normal. Trace amount of pericardial fluid and/or thickening, unlikely to be of any hemodynamic significance at this time. No associated pericardial calcification. There is aortic atherosclerosis, as well as atherosclerosis of the great vessels of the mediastinum and the coronary arteries, including calcified atherosclerotic plaque in the left anterior descending coronary artery.   Mediastinum/Nodes: No pathologically enlarged mediastinal or hilar lymph nodes. Please note that accurate exclusion of hilar adenopathy is limited on noncontrast CT scans. Esophagus is unremarkable in appearance. No axillary lymphadenopathy.   Lungs/Pleura: High-resolution images demonstrate widespread areas of ground-glass attenuation, septal thickening, thickening of the peribronchovascular interstitium, mild cylindrical traction bronchiectasis and peripheral bronchiolectasis in the lungs bilaterally, most evident in the mid to lower lungs. No frank honeycombing. Inspiratory and expiratory imaging is unremarkable. No acute consolidative airspace disease. No pleural effusions. No definite suspicious appearing pulmonary nodules or masses are noted.   Upper Abdomen: Unremarkable.   Musculoskeletal: There are no aggressive appearing lytic or blastic lesions noted in the visualized portions of the skeleton.   IMPRESSION: 1. The appearance of the lungs is indicative of interstitial lung disease, with a spectrum of findings considered probable usual interstitial pneumonia (UIP) per current ATS guidelines. Repeat high-resolution chest CT is recommended in 12 months to assess for temporal changes in the appearance of the lung parenchyma. 2. Aortic atherosclerosis, in addition to left anterior descending coronary artery disease. Please note that although the presence  of coronary artery calcium documents the presence of coronary artery disease, the severity of this disease and any potential stenosis cannot be assessed on this non-gated CT examination. Assessment for potential risk factor modification, dietary therapy or pharmacologic therapy may be warranted, if clinically indicated.   Aortic Atherosclerosis (ICD10-I70.0).     Electronically Signed   By: Vinnie Langton M.D.   On: 07/06/2022 06:57 __________________  July 2022 lower extremity ultrasound showing DVT, with PCP result note indicating it was his second DVT with plans for hematology referral.  Hematology follow-up note 05/25/2022 indicates patient's initial DVT and PE were in August 2013. ____________________   Assessment: Encounter Diagnoses  Name Primary?   Pharyngoesophageal dysphagia  Yes   Special screening for malignant neoplasms, colon    Long term (current) use of antithrombotics/antiplatelets     His dysphagia is related to the inflammatory myopathy and lately improved on treatment.  Therefore, I think it is unlikely that he has a mechanical stricture and I am not planning an upper endoscopy.  He also does not have chronic upper digestive symptoms such as nausea and heartburn or vomiting that require endoscopic evaluation.  Average risk for colorectal cancer, due for screening colonoscopy.  He now feels ready to do so, and was agreeable after discussion of procedure and risks.  The benefits and risks of the planned procedure were described in detail with the patient or (when appropriate) their health care proxy.  Risks were outlined as including, but not limited to, bleeding, infection, perforation, adverse medication reaction leading to cardiac or pulmonary decompensation, pancreatitis (if ERCP).  The limitation of incomplete mucosal visualization was also discussed.  No guarantees or warranties were given.  Hold Eliquis 36 hours before procedure.  We will clear this with his  hematologist, though I expect they will be agreeable, as he reports having been able to briefly hold it for his recent muscle biopsy.   Thank you for the courtesy of this consult.  Please call me with any questions or concerns.  Nelida Meuse III  CC: Referring provider noted above

## 2022-08-08 NOTE — Patient Instructions (Signed)
_______________________________________________________  If you are age 51 or older, your body mass index should be between 23-30. Your Body mass index is 32.62 kg/m. If this is out of the aforementioned range listed, please consider follow up with your Primary Care Provider.  If you are age 66 or younger, your body mass index should be between 19-25. Your Body mass index is 32.62 kg/m. If this is out of the aformentioned range listed, please consider follow up with your Primary Care Provider.   ________________________________________________________  The [facility] providers would like to encourage you to use [facility] to communicate with providers for non-urgent requests or questions.  Due to long hold times on the telephone, sending your provider a message by [facility] may be a faster and more efficient way to get a response.  Please allow 48 business hours for a response.  Please remember that this is for non-urgent requests.  _______________________________________________________  Cameron Kim have been scheduled for a colonoscopy. Please follow written instructions given to you at your visit today.  Please pick up your prep supplies at the pharmacy within the next 1-3 days. If you use inhalers (even only as needed), please bring them with you on the day of your procedure.  Due to recent changes in healthcare laws, you may see the results of your imaging and laboratory studies on MyChart before your provider has had a chance to review them.  We understand that in some cases there may be results that are confusing or concerning to you. Not all laboratory results come back in the same time frame and the provider may be waiting for multiple results in order to interpret others.  Please give [place] 48 hours in order for your provider to thoroughly review all the results before contacting the office for clarification of your results.   You will be contacted by our office prior to your procedure for directions on  holding your ELIQUIS.  If you do not hear from our office 1 week prior to your scheduled procedure, please call [phone] to discuss.    It was a pleasure to see you today!  Thank you for trusting me with your gastrointestinal care!

## 2022-08-10 ENCOUNTER — Ambulatory Visit (INDEPENDENT_AMBULATORY_CARE_PROVIDER_SITE_OTHER): Payer: BC Managed Care – PPO | Admitting: Internal Medicine

## 2022-08-10 DIAGNOSIS — R059 Cough, unspecified: Secondary | ICD-10-CM | POA: Diagnosis not present

## 2022-08-10 DIAGNOSIS — M3320 Polymyositis, organ involvement unspecified: Secondary | ICD-10-CM

## 2022-08-10 LAB — PULMONARY FUNCTION TEST
DL/VA % pred: 86 %
DL/VA: 3.77 ml/min/mmHg/L
DLCO cor % pred: 47 %
DLCO cor: 14.87 ml/min/mmHg
DLCO unc % pred: 48 %
DLCO unc: 14.99 ml/min/mmHg
FEF 25-75 Post: 2.75 L/sec
FEF 25-75 Pre: 2.76 L/sec
FEF2575-%Change-Post: 0 %
FEF2575-%Pred-Post: 76 %
FEF2575-%Pred-Pre: 76 %
FEV1-%Change-Post: 2 %
FEV1-%Pred-Post: 59 %
FEV1-%Pred-Pre: 58 %
FEV1-Post: 2.49 L
FEV1-Pre: 2.43 L
FEV1FVC-%Change-Post: 0 %
FEV1FVC-%Pred-Pre: 108 %
FEV6-%Change-Post: 2 %
FEV6-%Pred-Post: 56 %
FEV6-%Pred-Pre: 55 %
FEV6-Post: 2.95 L
FEV6-Pre: 2.89 L
FEV6FVC-%Pred-Post: 103 %
FEV6FVC-%Pred-Pre: 103 %
FVC-%Change-Post: 2 %
FVC-%Pred-Post: 54 %
FVC-%Pred-Pre: 53 %
FVC-Post: 2.95 L
FVC-Pre: 2.89 L
Post FEV1/FVC ratio: 84 %
Post FEV6/FVC ratio: 100 %
Pre FEV1/FVC ratio: 84 %
Pre FEV6/FVC Ratio: 100 %
RV % pred: 64 %
RV: 1.4 L
TLC % pred: 56 %
TLC: 4.2 L

## 2022-08-10 NOTE — Progress Notes (Signed)
Full PFT Performed Today  

## 2022-08-10 NOTE — Patient Instructions (Signed)
Full PFT Performed Today  

## 2022-08-14 ENCOUNTER — Ambulatory Visit (HOSPITAL_COMMUNITY)
Admission: RE | Admit: 2022-08-14 | Discharge: 2022-08-14 | Disposition: A | Discharge status: Home / Self Care | Payer: BC Managed Care – PPO | Source: Ambulatory Visit | Attending: Family Medicine | Admitting: Family Medicine

## 2022-08-14 DIAGNOSIS — I739 Peripheral vascular disease, unspecified: Secondary | ICD-10-CM | POA: Diagnosis not present

## 2022-08-14 DIAGNOSIS — G7249 Other inflammatory and immune myopathies, not elsewhere classified: Secondary | ICD-10-CM | POA: Diagnosis not present

## 2022-08-14 DIAGNOSIS — R1310 Dysphagia, unspecified: Secondary | ICD-10-CM

## 2022-08-14 DIAGNOSIS — G40909 Epilepsy, unspecified, not intractable, without status epilepticus: Secondary | ICD-10-CM | POA: Diagnosis not present

## 2022-08-14 NOTE — Telephone Encounter (Signed)
Patient has been notified and aware. No additional questions at this time.   

## 2022-08-16 ENCOUNTER — Encounter: Payer: Self-pay | Admitting: Certified Registered Nurse Anesthetist

## 2022-08-20 ENCOUNTER — Ambulatory Visit (INDEPENDENT_AMBULATORY_CARE_PROVIDER_SITE_OTHER): Payer: BC Managed Care – PPO | Admitting: Orthopedic Surgery

## 2022-08-20 DIAGNOSIS — M12812 Other specific arthropathies, not elsewhere classified, left shoulder: Secondary | ICD-10-CM | POA: Diagnosis not present

## 2022-08-20 DIAGNOSIS — M12811 Other specific arthropathies, not elsewhere classified, right shoulder: Secondary | ICD-10-CM | POA: Diagnosis not present

## 2022-08-22 ENCOUNTER — Encounter: Payer: Self-pay | Admitting: Orthopedic Surgery

## 2022-08-22 NOTE — Progress Notes (Signed)
Office Visit Note   Patient: Cameron Kim           Date of Birth: 30-Mar-1971           MRN: [id number] Visit Date: 08/20/2022 Requested by: Sheliah Hatch, MD [address] [place],  [place] [address] PCP: Sheliah Hatch, MD  Subjective: Chief Complaint  Patient presents with   Other     Scan review    HPI: Cameron Kim is a 51 y.o. male who presents to the office reporting right shoulder pain.  Since he was last seen he has had a CT scan of the right shoulder which shows rotator cuff arthropathy and moderate glenohumeral joint arthritis.  He does have polymyositis.  Currently on a prednisone taper going from 80 to 30 mg currently daily.  Still has pain along with diminished function in that right shoulder..                ROS: All systems reviewed are negative as they relate to the chief complaint within the history of present illness.  Patient denies fevers or chills.  Assessment & Plan: Visit Diagnoses: No diagnosis found.  Plan: Impression is right shoulder arthritis and rotator cuff arthropathy.  Plan is to have Cameron Kim get as healthy as possible in terms of coming off of his high-dose steroids.  He is over the midpoint of his taper.  We will discuss further management of this problem in January.  Would likely be reverse shoulder replacement based on lack of passive range of motion.  That can also be further assessed at his return clinic visit  Follow-Up Instructions: No follow-ups on file.   Orders:  No orders of the defined types were placed in this encounter.  No orders of the defined types were placed in this encounter.     Procedures: No procedures performed   Clinical Data: No additional findings.  Objective: Vital Signs: There were no vitals taken for this visit.  Physical Exam:  Constitutional: Patient appears well-developed HEENT:  Head: Normocephalic Eyes:EOM are normal Neck: Normal range of motion Cardiovascular: Normal  rate Pulmonary/chest: Effort normal Neurologic: Patient is alert Skin: Skin is warm Psychiatric: Patient has normal mood and affect  Ortho Exam: Ortho exam demonstrates full active and passive range of motion of the elbows and wrist on the right-hand side.  Deltoid fires.  Passive range of motion is approximately 35/70/90.  Active forward flexion and AB duction both below 90 degrees.  Does have weakness to external rotation but subscap strength intact  Specialty Comments:  No specialty comments available.  Imaging: No results found.   PMFS History: Patient Active Problem List   Diagnosis Date Noted   Polymyositis, organ involvement unspecified (HCC) 06/24/2022   Interstitial lung disease (HCC) 06/24/2022   History of DVT (deep vein thrombosis) 05/28/2022   Low serum albumin 05/13/2022   Pericardial effusion 02/28/2022   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   Unintentional weight loss 02/11/2022   Lower extremity edema 02/11/2022   Dysarthria 02/11/2022   Weakness generalized 02/11/2022   Chronic pain of both shoulders 02/11/2022   Dry skin 02/11/2022   History of non-ST elevation myocardial infarction (NSTEMI) 02/11/2022   Acute deep vein thrombosis (DVT) of lower extremity (HCC) 05/02/2021   Seizure disorder (HCC) 11/07/2020   Epistaxis 06/15/2020   Erectile dysfunction 10/31/2017   Adjustment disorder with depressed mood 03/25/2017   Glucose intolerance 02/25/2017   Physical exam 12/03/2014   Right bundle branch block with  left anterior fascicular block 01/18/2014   Hx of pulmonary embolus 01/13/2014   Soft tissue mass 01/13/2014   Overweight (BMI 25.0-29.9) 01/13/2014   Allergic rhinitis 01/13/2014   Past Medical History:  Diagnosis Date   Abnormal EKG    Ankle fracture, left    DVT (deep venous thrombosis) (HCC)    Dysrhythmia    extra beat or skips a beat   Idiopathic inflammatory myopathy    Peripheral vascular disease (HCC) 2013   dvt-pe after ankle fx   Polymyositis  (HCC)    Pulmonary embolism (HCC) 05/2012   DVT traveled to lung   Seizure disorder (HCC) 11/07/2020   had 2 but none since not on seizure meds    Family History  Problem Relation Age of Onset   Diabetes Mother    Hypertension Mother    Heart murmur Mother    Stroke Mother    Diabetes Father    Hypertension Father    ALS Father    Stroke Sister    Rheum arthritis Brother    Diabetes Paternal Grandmother    Diabetes Paternal Grandfather     Past Surgical History:  Procedure Laterality Date   CARDIAC CATHETERIZATION  2021   I & D EXTREMITY Right 06/07/2016   Procedure: IRRIGATION AND DEBRIDEMENT RIGHT WRIST,MEDIAN NERVE EXPLORATION NEUROLYSIS AND REPAIR;  Surgeon: Dominica Severin, MD;  Location: [facility];  Service: Orthopedics;  Laterality: Right;   medial nerve surgry right arm     MUSCLE BIOPSY Right 07/02/2022   Procedure: RIGHT THIGH MUSCLE BIOPSY;  Surgeon: Griselda Miner, MD;  Location[facility];  Service: General;  Laterality: Right;   Social History   Occupational History   Occupation: disabled  Tobacco Use   Smoking status: Never    Passive exposure: Never   Smokeless tobacco: Never  Vaping Use   Vaping Use: Never used  Substance and Sexual Activity   Alcohol use: Not Currently   Drug use: No   Sexual activity: Yes

## 2022-08-23 ENCOUNTER — Encounter: Payer: Self-pay | Admitting: Gastroenterology

## 2022-08-23 ENCOUNTER — Ambulatory Visit (AMBULATORY_SURGERY_CENTER): Payer: BC Managed Care – PPO | Admitting: Gastroenterology

## 2022-08-23 VITALS — BP 135/85 | HR 71 | Temp 97.1°F | Resp 14 | Ht 72.0 in | Wt 240.0 lb

## 2022-08-23 DIAGNOSIS — Z1211 Encounter for screening for malignant neoplasm of colon: Secondary | ICD-10-CM

## 2022-08-23 MED ORDER — SODIUM CHLORIDE 0.9 % IV SOLN: 500.0000 mL | Freq: Once | INTRAVENOUS | Status: DC

## 2022-08-23 NOTE — Progress Notes (Signed)
Office Visit Note  Patient: Cameron Kim             Date of Birth: [birth year]           MRN: [id number]             PCP: Sheliah Hatch, MD Referring: Sheliah Hatch, MD Visit Date: 09/06/2022 Occupation: @GUAROCC @  Subjective:  Medication monitoring   History of Present Illness: Cameron Kim is a 51 y.o. male with history of dermatomyositis and ILD.  He is currently taking CellCept 500 mg 3 tablets twice daily and remains on Bactrim 3 times weekly as prescribed.  He has been following the prednisone taper as prescribed.  He is currently on prednisone 20 mg daily and will be reducing to 17.5 mg starting tomorrow.  He states that he has started to notice some recurrence of symptoms as he is started to taper the dose of prednisone.  He has noticed a recurrence of the rash on his right hand as well as more frequent symptoms of Raynaud's phenomenon.  He continues to have some generalized joint stiffness and has been stretching on a daily basis.  Overall he has not noticed any increased muscle weakness as he has been reducing the dose of prednisone.  He denies any joint swelling at this time. Patient reports that he was evaluated by Dr. Marchelle Gearing on 08/28/2022 and the plan is to discuss adding on Rituxan as combination therapy with CellCept.  He is not currently taking antifibrotic agent. He is scheduled for a right heart cath on 09/12/2022. He denies any recent or recurrent infections.   Activities of Daily Living:  Patient reports morning stiffness for all day. Patient Denies nocturnal pain.  Difficulty dressing/grooming: Reports Difficulty climbing stairs: Reports Difficulty getting out of chair: Reports Difficulty using hands for taps, buttons, cutlery, and/or writing: Reports  Review of Systems  Constitutional:  Positive for fatigue.  HENT:  Positive for mouth dryness. Negative for mouth sores.        Nose sore  Eyes:  Positive for dryness.  Respiratory:  Positive for  shortness of breath.   Cardiovascular:  Positive for palpitations. Negative for chest pain.  Gastrointestinal:  Positive for constipation and diarrhea. Negative for blood in stool.  Endocrine: Negative for increased urination.  Genitourinary:  Negative for involuntary urination.  Musculoskeletal:  Positive for joint pain, gait problem, joint pain, myalgias, muscle weakness, morning stiffness, muscle tenderness and myalgias. Negative for joint swelling.  Skin:  Positive for color change. Negative for rash and sensitivity to sunlight.  Allergic/Immunologic: Negative for susceptible to infections.  Neurological:  Positive for numbness. Negative for dizziness and headaches.  Hematological:  Negative for swollen glands.  Psychiatric/Behavioral:  Negative for depressed mood and sleep disturbance. The patient is not nervous/anxious.     PMFS History:  Patient Active Problem List   Diagnosis Date Noted   Polymyositis, organ involvement unspecified (HCC) 06/24/2022   Interstitial lung disease (HCC) 06/24/2022   History of DVT (deep vein thrombosis) 05/28/2022   Low serum albumin 05/13/2022   Pericardial effusion 02/28/2022   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   Unintentional weight loss 02/11/2022   Lower extremity edema 02/11/2022   Dysarthria 02/11/2022   Weakness generalized 02/11/2022   Chronic pain of both shoulders 02/11/2022   Dry skin 02/11/2022   History of non-ST elevation myocardial infarction (NSTEMI) 02/11/2022   Acute deep vein thrombosis (DVT) of lower extremity (HCC) 05/02/2021   Seizure disorder (HCC) 11/07/2020   Epistaxis 06/15/2020  Erectile dysfunction 10/31/2017   Adjustment disorder with depressed mood 03/25/2017   Glucose intolerance 02/25/2017   Physical exam 12/03/2014   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 fascicular block 01/18/2014   Hx of pulmonary embolus 01/13/2014   Soft tissue mass 01/13/2014   Overweight (BMI 25.0-29.9) 01/13/2014   Allergic rhinitis  01/13/2014    Past Medical History:  Diagnosis Date   Abnormal EKG    Ankle fracture, left    DVT (deep venous thrombosis) (HCC)    Dysrhythmia    extra beat or skips a beat   Idiopathic inflammatory myopathy    Peripheral vascular disease (HCC) 2013   dvt-pe after ankle fx   Polymyositis (HCC)    Pulmonary embolism (HCC) 05/2012   DVT traveled to lung   Seizure disorder (HCC) 11/07/2020   had 2 but none since not on seizure meds    Family History  Problem Relation Age of Onset   Diabetes Mother    Hypertension Mother    Heart murmur Mother    Stroke Mother    Diabetes Father    Hypertension Father    ALS Father    Stroke Sister    Rheum arthritis Brother    Diabetes Paternal Grandmother    Diabetes Paternal Grandfather    Colon cancer Neg Hx    Esophageal cancer Neg Hx    Rectal cancer Neg Hx    Stomach cancer Neg Hx    Past Surgical History:  Procedure Laterality Date   CARDIAC CATHETERIZATION  2021   COLONOSCOPY     I & D EXTREMITY Right 06/07/2016   Procedure: IRRIGATION AND DEBRIDEMENT RIGHT WRIST,MEDIAN NERVE EXPLORATION NEUROLYSIS AND REPAIR;  Surgeon: Dominica Severin, MD;  Location: [facility] OR;  Service: Orthopedics;  Laterality: Right;   medial nerve surgry right arm     MUSCLE BIOPSY Right 07/02/2022   Procedure: RIGHT THIGH MUSCLE BIOPSY;  Surgeon: Griselda Miner, MD;  Location: [facility] OR;  Service: General;  Laterality: Right;   Social History   Social History Narrative   Not on file   Immunization History  Administered Date(s) Administered   Influenza,inj,Quad PF,6+ Mos 09/01/2018   Moderna SARS-COV2 Booster Vaccination 11/05/2020   PPD Test 07/18/2022   Tdap 10/08/2004, 12/03/2014     Objective: Vital Signs: BP (!) 155/85 (BP Location: Left Arm, Patient Position: Sitting, Cuff Size: Large)   Pulse 87   Resp 18   Ht 6' (1.829 m)   Wt 247 lb 12.8 oz (112.4 kg)   BMI 33.61 kg/m    Physical Exam Vitals and nursing note reviewed.  Constitutional:       Appearance: He is well-developed.  HENT:     Head: Normocephalic and atraumatic.  Eyes:     Conjunctiva/sclera: Conjunctivae normal.     Pupils: Pupils are equal, round, and reactive to light.  Cardiovascular:     Rate and Rhythm: Normal rate and regular rhythm.     Heart sounds: Normal heart sounds.  Pulmonary:     Effort: Pulmonary effort is normal.     Breath sounds: Normal breath sounds.     Comments: Very faint crackles Abdominal:     General: Bowel sounds are normal.     Palpations: Abdomen is soft.  Musculoskeletal:     Cervical back: Normal range of motion and neck supple.  Skin:    General: Skin is warm and dry.     Capillary Refill: Capillary refill takes less than 2 seconds.  Neurological:  Mental Status: He is alert and oriented to person, place, and time.  Psychiatric:        Behavior: Behavior normal.      Musculoskeletal Exam: C-spine has good range of motion.  Limited abduction of the right shoulder.  Some stiffness with range of motion of the left shoulder as well.  Elbow joints, wrist joints, MCPs, PIPs, DIPs have good range of motion with no synovitis.  Some tenderness over PIP joints.  Complete fist formation bilaterally.  Hip joints have good range of motion with no groin pain currently.  Knee joints have good range of motion with no warmth or effusion.  Ankle joints have good range of motion with no tenderness or joint swelling. Muscle strength 4+ out of 5 in all extremities.    CDAI Exam: CDAI Score: -- Patient Global: --; Provider Global: -- Swollen: --; Tender: -- Joint Exam 09/06/2022   No joint exam has been documented for this visit   There is currently no information documented on the homunculus. Go to the Rheumatology activity and complete the homunculus joint exam.  Investigation: No additional findings.  Imaging: Nada Boozer SPEECH PATH  Result Date: 08/14/2022 Table formatting from the original result was not included. Images  from the original result were not included. Objective Swallowing Evaluation: Type of Study: MBS-Modified Barium Swallow Study  Patient Details Name: Cameron Kim MRN: [id number] Date of Birth: July 10, 1971 Today's Date: 08/14/2022 Time: SLP Start Time (ACUTE ONLY): 1325 -SLP Stop Time (ACUTE ONLY): 1355 SLP Time Calculation (min) (ACUTE ONLY): 30 min Past Medical History: Past Medical History: Diagnosis Date  Abnormal EKG   Ankle fracture, left   DVT (deep venous thrombosis) (HCC)   Dysrhythmia   extra beat or skips a beat  Idiopathic inflammatory myopathy   Peripheral vascular disease (HCC) 2013  dvt-pe after ankle fx  Polymyositis (HCC)   Pulmonary embolism (HCC) 05/2012  DVT traveled to lung  Seizure disorder (HCC) 11/07/2020  had 2 but none since not on seizure meds Past Surgical History: Past Surgical History: Procedure Laterality Date  CARDIAC CATHETERIZATION  2021  I & D EXTREMITY Right 06/07/2016  Procedure: IRRIGATION AND DEBRIDEMENT RIGHT WRIST,MEDIAN NERVE EXPLORATION NEUROLYSIS AND REPAIR;  Surgeon: Dominica Severin, MD;  Location: [facility] OR;  Service: Orthopedics;  Laterality: Right;  medial nerve surgry right arm    MUSCLE BIOPSY Right 07/02/2022  Procedure: RIGHT THIGH MUSCLE BIOPSY;  Surgeon: Griselda Miner, MD;  Location: [facility] OR;  Service: General;  Laterality: Right; HPI: pt is a 51 yo male referred for OP MBS from Dr Reece Levy.  Pt has recent diagnosis of idiopathic inflammatory myopathy - Jo1 -Ab+, Ro  Ab+,  - resulting in severe muscle weakness, dysarthria, dysphonia and dysphagia.   Per MD note, pt was placed on prednisone 40 mg BID daily since June 01, 2022.  Pt has h/o seizure d/o, PVD, PE, epistaxis. He reported dysphagia to solids and liquids - and reports nasal tone to voice.  [facility]ology saw him on Sept 22, 2023. Pt reports he had some dysphagia to both solids and liquids - coughing more with liquids over the last few months that significantly improved after his prednisone treatment. He reports  some weight loss during acute illness but has reported gaining weight to near baseline since his treatment. Denies requiring heimlich manuever nor having pnas -  CT chest showed bilateral lower lobe bronchiectasis and lower lob groundglass opacity.  Subjective: pt awake  Recommendations for follow up therapy are one  component of a multi-disciplinary discharge planning process, led by the attending physician.  Recommendations may be updated based on patient status, additional functional criteria and insurance authorization. Assessment / Plan / Recommendation   08/14/2022   1:00 PM Clinical Impressions Clinical Impression Patient presents with normal oropharyngeal swallow ability without aspiration of any consistency tested.   No focal CN deficits present and pt without dysarthria, nor perceived hypernasality.  Trace penetration of thin when swallowing tablet and nectar with first swallow of MBS cleared within the swallow.    Pharyngeal swallow is strong without retention.  Pt noted to clear his throat during MBS after consumption of barium.  Pt reports previously he had to turn his head to swallow - but he does not recall which way he turned his head.   Pt did sense retention in pharynx after swallowing solid - when his pharynx was clear. A-P view conducted showing adequate clearance. SLP administered Reflux Symptom Index (RSI) administered -  and pt scored 27/45.  Per authors highly indicative of reflux to level of pharynx and larynx. Recommend regular/thin diet. SLP Visit Diagnosis Dysphagia, oral phase (R13.11) Impact on safety and function No limitations      No data to display         No data to display      08/14/2022   1:00 PM Diet Recommendations SLP Diet Recommendations Regular solids;Thin liquid Liquid Administration via Straw;Cup Medication Administration Whole meds with liquid Compensations Slow rate;Small sips/bites Postural Changes Seated upright at 90 degrees;Remain semi-upright after after feeds/meals  (Comment)     08/14/2022   1:00 PM Other Recommendations Follow Up Recommendations No SLP follow up    No data to display        08/14/2022   1:00 PM Oral Phase Oral Phase [facility]    08/14/2022   1:00 PM Pharyngeal Phase Pharyngeal Phase WFL;Impaired Pharyngeal- Nectar Cup Penetration/Aspiration during swallow Pharyngeal Material enters airway, remains ABOVE vocal cords then ejected out Pharyngeal- Thin Cup Penetration/Aspiration during swallow Pharyngeal Material enters airway, remains ABOVE vocal cords then ejected out    08/14/2022   1:00 PM Cervical Esophageal Phase  Cervical Esophageal Phase WFL Rolena Infante, MS [facility] SLP Acute Rehab Services Office [phone] Pager [phone] Chales Abrahams 08/14/2022, 2:53 PM                      Recent Labs: Lab Results  Component Value Date   WBC 20.0 (H) 08/28/2022   HGB 15.6 08/28/2022   PLT 324 08/28/2022   NA 139 08/28/2022   K 3.9 08/28/2022   CL 103 08/28/2022   CO2 30 08/28/2022   GLUCOSE 88 08/28/2022   BUN 15 08/28/2022   CREATININE 0.82 08/28/2022   BILITOT 0.4 08/28/2022   ALKPHOS 52 08/28/2022   AST 19 08/28/2022   ALT 11 08/28/2022   PROT 7.4 08/28/2022   ALBUMIN 4.0 08/28/2022   CALCIUM 9.5 08/28/2022   GFRAA >60 06/06/2016   QFTBGOLDPLUS INDETERMINATE (A) 06/20/2022    Speciality Comments: PPD Negative 07/20/2022  Procedures:  No procedures performed Allergies: Patient has no known allergies.      Assessment / Plan:     Visit Diagnoses: Idiopathic inflammatory myopathy-Jo-1 Ab +, Ro Ab + - elevated CK, aldolase, positive ANA, positive RF, positive anti-CCP, severe muscle weakness with dysphagia, dysphonia and dysarthria, mechanics hands: Muscle biopsy performed on 07/02/2022 consistent with inflammatory myopathy without prominent necrosis however the clear evidence of perifascicular atrophy and upregulation of  MHC in these areas highly suggest dermatomyositis. He remains under the care of Dr. Marchelle Gearing for management of ILD.  He  has also been under the care of Dr. Myrtie Neither and had a negative barium swallow study on 08/14/2022.  He is scheduled for an upcoming right heart catheterization on 09/12/2022. Patient was last seen in our office on 07/31/2022.  He has been taking CellCept 500 mg 3 tablets twice daily as prescribed by Dr. Steva Colder at [facility] rheumatology.  He has been on the increased dose of CellCept for the past 1 month.  He has been tolerating CellCept without any side effects.  He has been following the prednisone taper as advised and is currently on his last day of prednisone 20 mg daily and is planning on reducing to 17.5 mg starting tomorrow.  He has started to notice some skin changes as he has been reducing the dose of prednisone.  He has not noticed any increased muscular weakness.  He has not noticed any other new or worsening of symptoms.  Plan to try slowing the prednisone taper slightly.  He will take prednisone 20 mg for 1 more week and then try reducing to 17.5 mg (tapering by 2.5 mg every week until he reaches 10 mg and then will reduce by 1mg  every 2 weeks).  Dr. Steva Colder at [facility] rheumatology suggested adding on Plaquenil in the future if he develops a recurrence of inflammatory arthritis as he reduces prednisone.  On examination today he had no synovitis.  He was advised to notify us if he develops increased joint pain or joint swelling. CK will be rechecked today.  He will remain on the current dose of CellCept and Bactrim as prescribed.  A refill of CellCept will be sent to the pharmacy today.  He was advised to notify us if he develops signs or symptoms of a flare.  He will follow-up in the office in 6 weeks or sooner if needed.  Plan: CK  Long term (current) use of systemic steroids -Plan: tapering prednisone by 5 mg every week until reaching 20 mg daily--he is currently taking prednisone 20 mg daily and is supposed to taper to 17.5 mg starting tomorrow.  Since he has been having some increased joint pain and stiffness  we discussed slowing down the taper gradually.  He plans on taking another week of prednisone 20 mg daily prior to trying to reduce by 2.5 mg every week until reaching 10 mg daily.  High risk medication use - Cellcept 500 mg 3 tablets by mouth twice daily, Bactrim 3 days weekly, and prednisone taper.  Dr. Marchelle Gearing is considering adding Rituxan 1000 mg x 2 on day 1 and day 15 in the future.  Discussed the risk for immunosuppression on combination therapy.  Encourage patient to hold CellCept if he develops signs or symptoms of infection and to resume once infection is completely cleared.  He has not had any recent or recurrent infections since initiating therapy. CBC (stable) and CMP-WNL were updated on 08/28/2022.  His next lab work will be to in February and every 3 months to monitor for drug toxicity.  - Plan: CBC with Differential/Platelet, COMPLETE METABOLIC PANEL WITH GFR He has not had any recent or recurrent infections.  TPMT intermediate metabolizer (HCC) - TPMT was 6, low metabolizer  Interstitial lung disease (HCC) - Followed by Dr. Marchelle Gearing.  Pain crackles noted.  High-resolution CT on 07/04/22: showed features of UIP.  PFTs performed on 08/10/2022.  Reviewed office visit note from  08/28/2022.  Dr. Marchelle Gearing plans on holding off on adding an antifibrotic agents at this time. He recommended continuing the use of CellCept and plans to add on Rituxan x 2 1000 mg day 1 and day 15 in several months.  Dr. Marchelle Gearing plans on reaching out to [facility] rheumatology to get their input on combination therapy as well. Patient is scheduled for right heart catheterization on 09/12/2022.  Other dysphagia - Referred to Dr. Myrtie Neither.  Patient underwent a barium swallow study on 08/14/2022-negative modified barium swallow-per Dr. Chales Abrahams on 08/26/22.   Chronic pain of both shoulders - Dr. August Saucer.  Right rotator cuff tear-limited abduction of both shoulders, especially the right shoulder.  Reviewed office visit note from  08/20/2022.  Considering proceeding with reverse shoulder replacement once he is on a reduced dose of prednisone. Follow-up scheduled with Dr. August Saucer on 10/10/2022.  Pain in both hands - Pain and mechanic hands initially resolved with prednisone use.  He has been experiencing some increased stiffness in both hands as well as pain due to Raynaud's phenomenon with the colder weather temperatures.  On examination he has no synovitis or dactylitis.   Raynaud's syndrome without gangrene: He has been experiencing intermittent symptoms of Raynaud's phenomenon in his fingertips with the colder weather temperatures.  Discussed the diagnosis of Raynaud's as well as treatment options.  He was encouraged to avoid triggers including exposures to cold temperatures and tobacco smoke.  Strongly encourage patient to keep his core body temperature warm as well as wearing gloves and socks especially while outdoors.  He was advised to notify us if he develops any new or worsening symptoms.  Steroid-induced osteoporosis - DEXA order remains in place.   History of DVT (deep vein thrombosis) - Anticardiolipin antibody and beta-2 GP 1 were negative.  He has positive ANA and positive Ro antibody.  He is on Eliquis.  Other medical conditions are listed as follows:   Hx of pulmonary embolus  Leukocytosis/thrombocytosis - Longstanding history of leukocytosis and thrombocytosis.  Pericardial effusion  Lower extremity edema  Seizure disorder (HCC)  History of non-ST elevation myocardial infarction (NSTEMI)  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 fascicular block  Orders: Orders Placed This Encounter  Procedures   CBC with Differential/Platelet   COMPLETE METABOLIC PANEL WITH GFR   CK   Meds ordered this encounter  Medications   predniSONE (DELTASONE) 10 MG tablet    Sig: Take 1 tablet by mouth daily.    Dispense:  30 tablet    Refill:  0   predniSONE (DELTASONE) 5 MG tablet    Sig: Take 1.5 tabs po in  the morning for 1 week, take 1 tab in the morning for 1 week, take 1/2 in the morning for 1 week. Follow taper as discussed.    Dispense:  21 tablet    Refill:  0    Follow-Up Instructions: Return in about 6 weeks (around 10/18/2022) for DM, ILD .   Gearldine Bienenstock, PA-C  Note - This record has been created using Dragon software.  Chart creation errors have been sought, but may not always  have been located. Such creation errors do not reflect on  the standard of medical care.

## 2022-08-23 NOTE — Op Note (Signed)
[facility] Patient Name: Cameron Kim Procedure Date: 08/23/2022 9:38 AM MRN: [id number] Endoscopist: Sherilyn Cooter L. Myrtie Neither , MD, [id number] Age: 51 Referring MD:  Date of Birth: 08/31/1971 Gender: Male Account #: [id number] Procedure:                Colonoscopy Indications:              Screening for colorectal malignant neoplasm, This                            is the patient's first colonoscopy Medicines:                Monitored Anesthesia Care Procedure:                Pre-Anesthesia Assessment:                           - Prior to the procedure, a History and Physical                            was performed, and patient medications and                            allergies were reviewed. The patient's tolerance of                            previous anesthesia was also reviewed. The risks                            and benefits of the procedure and the sedation                            options and risks were discussed with the patient.                            All questions were answered, and informed consent                            was obtained. Prior Anticoagulants: The patient has                            taken Eliquis (apixaban), last dose was 2 days                            prior to procedure. ASA Grade Assessment: III - A                            patient with severe systemic disease. After                            reviewing the risks and benefits, the patient was                            deemed in satisfactory condition to undergo the  procedure.                           After obtaining informed consent, the colonoscope                            was passed under direct vision. Throughout the                            procedure, the patient's blood pressure, pulse, and                            oxygen saturations were monitored continuously. The                            Olympus Scope [id number] was introduced through the                             anus and advanced to the the cecum, identified by                            appendiceal orifice and ileocecal valve. The                            colonoscopy was performed without difficulty. The                            patient tolerated the procedure well. The quality                            of the bowel preparation was excellent. The                            ileocecal valve, appendiceal orifice, and rectum                            were photographed. The bowel preparation used was                            SUPREP. Scope In: 9:47:12 AM Scope Out: 9:58:43 AM Scope Withdrawal Time: 0 hours 8 minutes 27 seconds  Total Procedure Duration: 0 hours 11 minutes 31 seconds  Findings:                 The perianal and digital rectal examinations were                            normal.                           Repeat examination of right colon under NBI                            performed.  There is no endoscopic evidence of polyps in the                            entire colon.                           A few small-mouthed diverticula were found in the                            left colon.                           There was a lipoma, in the recto-sigmoid colon.                           The exam was otherwise without abnormality on                            direct and retroflexion views. Complications:            No immediate complications. Estimated Blood Loss:     Estimated blood loss: none. Impression:               - Diverticulosis in the left colon.                           - Lipoma in the recto-sigmoid colon.                           - The examination was otherwise normal on direct                            and retroflexion views.                           - No specimens collected. Recommendation:           - Patient has a contact number available for                            emergencies. The signs and symptoms of  potential                            delayed complications were discussed with the                            patient. Return to normal activities tomorrow.                            Written discharge instructions were provided to the                            patient.                           - Resume previous diet.                           -  Continue present medications.                           - Resume Eliquis (apixaban) at prior dose today.                           - Repeat colonoscopy in 10 years for screening                            purposes. Jayel Scaduto L. Myrtie Neitheranis, MD 08/23/2022 10:06:00 AM This report has been signed electronically.

## 2022-08-23 NOTE — Progress Notes (Signed)
Pt's states no medical or surgical changes since previsit or office visit. 

## 2022-08-23 NOTE — Patient Instructions (Addendum)
Handouts on diverticulosis. Resume eliquis at previous dose today 08/23/22 Resume previous diet and continue present medications. Repeat colonoscopy in 10 years for surveillance!  YOU HAD AN ENDOSCOPIC PROCEDURE TODAY AT THE [facility]:   Refer to the procedure report that was given to you for any specific questions about what was found during the examination.  If the procedure report does not answer your questions, please call your gastroenterologist to clarify.  If you requested that your care partner not be given the details of your procedure findings, then the procedure report has been included in a sealed envelope for you to review at your convenience later.  YOU SHOULD EXPECT: Some feelings of bloating in the abdomen. Passage of more gas than usual.  Walking can help get rid of the air that was put into your GI tract during the procedure and reduce the bloating. If you had a lower endoscopy (such as a colonoscopy or flexible sigmoidoscopy) you may notice spotting of blood in your stool or on the toilet paper. If you underwent a bowel prep for your procedure, you may not have a normal bowel movement for a few days.  Please Note:  You might notice some irritation and congestion in your nose or some drainage.  This is from the oxygen used during your procedure.  There is no need for concern and it should clear up in a day or so.  SYMPTOMS TO REPORT IMMEDIATELY:  Following lower endoscopy (colonoscopy or flexible sigmoidoscopy):  Excessive amounts of blood in the stool  Significant tenderness or worsening of abdominal pains  Swelling of the abdomen that is new, acute  Fever of 100F or higher   For urgent or emergent issues, a gastroenterologist can be reached at any hour by calling [phone]. Do not use MyChart messaging for urgent concerns.    DIET:  We do recommend a small meal at first, but then you may proceed to your regular diet.  Drink plenty of fluids but you  should avoid alcoholic beverages for 24 hours.  ACTIVITY:  You should plan to take it easy for the rest of today and you should NOT DRIVE or use heavy machinery until tomorrow (because of the sedation medicines used during the test).    FOLLOW UP: Our staff will call the number listed on your records the next business day following your procedure.  We will call around 7:15- 8:00 am to check on you and address any questions or concerns that you may have regarding the information given to you following your procedure. If we do not reach you, we will leave a message.     If any biopsies were taken you will be contacted by phone or by letter within the next 1-3 weeks.  Please call us at [phone] if you have not heard about the biopsies in 3 weeks.    SIGNATURES/CONFIDENTIALITY: You and/or your care partner have signed paperwork which will be entered into your electronic medical record.  These signatures attest to the fact that that the information above on your After Visit Summary has been reviewed and is understood.  Full responsibility of the confidentiality of this discharge information lies with you and/or your care-partner.

## 2022-08-23 NOTE — Progress Notes (Signed)
Report given to PACU, vss 

## 2022-08-23 NOTE — Progress Notes (Signed)
No changes to clinical history since GI office visit on 08/08/22.  The patient is appropriate for an endoscopic procedure in the ambulatory setting.  - Amada Jupiter, MD

## 2022-08-24 ENCOUNTER — Telehealth: Payer: Self-pay | Admitting: *Deleted

## 2022-08-24 ENCOUNTER — Ambulatory Visit: Payer: BC Managed Care – PPO | Admitting: Rheumatology

## 2022-08-24 NOTE — Telephone Encounter (Signed)
  Follow up Call-     08/23/2022    8:42 AM  Call back number  Post procedure Call Back phone  # [phone]  Permission to leave phone message Yes     Patient questions:  Do you have a fever, pain , or abdominal swelling? No. Pain Score  0 *  Have you tolerated food without any problems? Yes.    Have you been able to return to your normal activities? Yes.    Do you have any questions about your discharge instructions: Diet   No. Medications  No. Follow up visit  No.  Do you have questions or concerns about your Care? No.  Actions: * If pain score is 4 or above: No action needed, pain <4.

## 2022-08-28 ENCOUNTER — Other Ambulatory Visit: Payer: Self-pay | Admitting: Hematology and Oncology

## 2022-08-28 ENCOUNTER — Ambulatory Visit (INDEPENDENT_AMBULATORY_CARE_PROVIDER_SITE_OTHER): Payer: BC Managed Care – PPO | Admitting: Internal Medicine

## 2022-08-28 ENCOUNTER — Other Ambulatory Visit: Payer: Self-pay

## 2022-08-28 ENCOUNTER — Other Ambulatory Visit (HOSPITAL_COMMUNITY): Payer: Self-pay

## 2022-08-28 ENCOUNTER — Encounter: Payer: Self-pay | Admitting: Internal Medicine

## 2022-08-28 ENCOUNTER — Telehealth: Payer: Self-pay | Admitting: Hematology and Oncology

## 2022-08-28 ENCOUNTER — Inpatient Hospital Stay (HOSPITAL_BASED_OUTPATIENT_CLINIC_OR_DEPARTMENT_OTHER): Payer: BC Managed Care – PPO | Admitting: Hematology and Oncology

## 2022-08-28 ENCOUNTER — Inpatient Hospital Stay: Payer: BC Managed Care – PPO | Attending: Physician Assistant

## 2022-08-28 ENCOUNTER — Telehealth: Payer: Self-pay | Admitting: Internal Medicine

## 2022-08-28 ENCOUNTER — Encounter: Payer: Self-pay | Admitting: Hematology and Oncology

## 2022-08-28 VITALS — BP 124/81 | HR 90 | Temp 97.8°F | Resp 19 | Ht 72.0 in | Wt 243.1 lb

## 2022-08-28 VITALS — BP 122/78 | HR 87 | Temp 98.2°F | Ht 72.0 in | Wt 241.6 lb

## 2022-08-28 DIAGNOSIS — M3391 Dermatopolymyositis, unspecified with respiratory involvement: Secondary | ICD-10-CM

## 2022-08-28 DIAGNOSIS — M3320 Polymyositis, organ involvement unspecified: Secondary | ICD-10-CM | POA: Diagnosis not present

## 2022-08-28 DIAGNOSIS — J8489 Other specified interstitial pulmonary diseases: Secondary | ICD-10-CM

## 2022-08-28 DIAGNOSIS — D849 Immunodeficiency, unspecified: Secondary | ICD-10-CM

## 2022-08-28 DIAGNOSIS — Z7952 Long term (current) use of systemic steroids: Secondary | ICD-10-CM

## 2022-08-28 DIAGNOSIS — I82432 Acute embolism and thrombosis of left popliteal vein: Secondary | ICD-10-CM | POA: Diagnosis not present

## 2022-08-28 DIAGNOSIS — Z86718 Personal history of other venous thrombosis and embolism: Secondary | ICD-10-CM | POA: Insufficient documentation

## 2022-08-28 DIAGNOSIS — Z79899 Other long term (current) drug therapy: Secondary | ICD-10-CM | POA: Insufficient documentation

## 2022-08-28 DIAGNOSIS — Z862 Personal history of diseases of the blood and blood-forming organs and certain disorders involving the immune mechanism: Secondary | ICD-10-CM | POA: Insufficient documentation

## 2022-08-28 DIAGNOSIS — R768 Other specified abnormal immunological findings in serum: Secondary | ICD-10-CM

## 2022-08-28 DIAGNOSIS — Z86711 Personal history of pulmonary embolism: Secondary | ICD-10-CM | POA: Diagnosis not present

## 2022-08-28 DIAGNOSIS — D72829 Elevated white blood cell count, unspecified: Secondary | ICD-10-CM | POA: Diagnosis not present

## 2022-08-28 DIAGNOSIS — M359 Systemic involvement of connective tissue, unspecified: Secondary | ICD-10-CM

## 2022-08-28 DIAGNOSIS — I288 Other diseases of pulmonary vessels: Secondary | ICD-10-CM

## 2022-08-28 DIAGNOSIS — M059 Rheumatoid arthritis with rheumatoid factor, unspecified: Secondary | ICD-10-CM

## 2022-08-28 DIAGNOSIS — Z7901 Long term (current) use of anticoagulants: Secondary | ICD-10-CM | POA: Diagnosis not present

## 2022-08-28 LAB — CMP (CANCER CENTER ONLY)
ALT: 11 U/L (ref 0–44)
AST: 19 U/L (ref 15–41)
Albumin: 4 g/dL (ref 3.5–5.0)
Alkaline Phosphatase: 52 U/L (ref 38–126)
Anion gap: 6 (ref 5–15)
BUN: 15 mg/dL (ref 6–20)
CO2: 30 mmol/L (ref 22–32)
Calcium: 9.5 mg/dL (ref 8.9–10.3)
Chloride: 103 mmol/L (ref 98–111)
Creatinine: 0.82 mg/dL (ref 0.61–1.24)
GFR, Estimated: >60 mL/min (ref >60)
Glucose, Bld: 88 mg/dL (ref 70–99)
Potassium: 3.9 mmol/L (ref 3.5–5.1)
Sodium: 139 mmol/L (ref 135–145)
Total Bilirubin: 0.4 mg/dL (ref 0.3–1.2)
Total Protein: 7.4 g/dL (ref 6.5–8.1)

## 2022-08-28 LAB — CBC WITH DIFFERENTIAL (CANCER CENTER ONLY)
Abs Immature Granulocytes: 0.19 10*3/uL — ABNORMAL HIGH (ref 0.00–0.07)
Basophils Absolute: 0.1 10*3/uL (ref 0.0–0.1)
Basophils Relative: 1 %
Eosinophils Absolute: 0.9 10*3/uL — ABNORMAL HIGH (ref 0.0–0.5)
Eosinophils Relative: 5 %
HCT: 48.6 % (ref 39.0–52.0)
Hemoglobin: 15.6 g/dL (ref 13.0–17.0)
Immature Granulocytes: 1 %
Lymphocytes Relative: 9 %
Lymphs Abs: 1.9 10*3/uL (ref 0.7–4.0)
MCH: 31 pg (ref 26.0–34.0)
MCHC: 32.1 g/dL (ref 30.0–36.0)
MCV: 96.4 fL (ref 80.0–100.0)
Monocytes Absolute: 1.3 10*3/uL — ABNORMAL HIGH (ref 0.1–1.0)
Monocytes Relative: 6 %
Neutro Abs: 15.7 10*3/uL — ABNORMAL HIGH (ref 1.7–7.7)
Neutrophils Relative %: 78 %
Platelet Count: 324 10*3/uL (ref 150–400)
RBC: 5.04 MIL/uL (ref 4.22–5.81)
RDW: 14.6 % (ref 11.5–15.5)
Smear Review: NORMAL
WBC Count: 20 10*3/uL — ABNORMAL HIGH (ref 4.0–10.5)
nRBC: 0 % (ref 0.0–0.2)

## 2022-08-28 NOTE — Telephone Encounter (Signed)
Yes needs RHC. I advised him and he has agreed

## 2022-08-28 NOTE — Progress Notes (Signed)
OV 06/26/2022  Subjective:  Patient ID: Cameron Kim, male , DOB: [birth year] , age 51 y.o. , MRN: [id number] , ADDRESS: [address] [place] [id number] PCP Cameron Minium, MD Patient Care Team: Cameron Minium, MD as PCP - General (Family Medicine) Cameron Harp, MD as PCP - Cardiology (Cardiology)  This Provider for this visit: Treatment Team:  Attending Provider: Brand Males, MD    06/26/2022 -   Chief Complaint  Patient presents with   Consult    Wheezing/ cough      HPI Cameron Kim 51 y.o. -referral from the rheumatology Cameron. Estanislado Kim  who he saw for the first time on 05/31/2022.  He has been diagnosed with elevated CK and Jo 1 positive polymyositis.  He used to work in [employer].  He has had COVID booster mRNA 3 times.  He had COVID in November 2022 and sometime after that started developing symptoms of weakness of the proximal muscles.  This also included dysphagia, dysphonia and dysarthria after he saw rheumatology on 05/31/2022 he was placed on prednisone 40 mg twice daily.  After this is significantly improved.  His results were elevated CK which is improving.  Jo 1 antibody positive positive ANA.  QuantiFERON gold indeterminate.  He has not been referred to [facility] where he has an upcoming appointment.  In the last few to several months he is also noticing cough with some wheezing.  He is also choking on saliva.  There is no shortness of breath per se but then he is weak and is not able to walk too much.  He is not disabled from his work.  He did have a CT scan of the abdomen 06/08/2022 in the lung images do show abnormalities.  However walking desaturation test today with this is normal.  He does not have echocardiogram.  He does not have pulmonary function test.  There is no high-resolution CT chest.    [facility] consult 9/122/23 Cameron Kim      No data to display         Chief Complaint  Patient presents with   Polymyositis   Cameron Kim is a 51 y.o. male who presents for evaluation of longstanding anti-Jo-1 antibody positive inflammatory myositis. The patient is accompanied by his wife, Cameron Kim.  Beginning in 08/2021, the patient noticed decreased strength which worsened to the point he was unable to move or sit up in bed. He saw his primary care provider in 02/2022 and has seen multiple physicians since. He recently saw a rheumatologist locally who diagnosed him with polymyositis and referred him here to [facility]. [name] also indicates he has difficulty swallowing and his voice has changed. He notes a weight loss of 48 pounds in 9 months. He also mentions he had COVID-19 around the same time. The patient endorses a rash which he feels looks like ringworm with extreme itchiness and flakiness.   The patient has a history of a torn rotator cuff but was denied surgery due to a protein abnormality. Once he was provided steroids on 06/01/2022, the patient improved overall. He continues to have stiffness which impacts his lifestyle.   The patient has had numerous tests including DEXA scan, upper abdominal exam, and is scheduled for a biopsy next week. He has noticed increased moodiness and appetite with prednisone. He also indicates he has insomnia. His prednisone taper was started at 80 mg and currently he continues on 60 mg daily.  The only joint pain [name] notes currently is hand pain with inability to fully close his hands. Prior to prednisone, he had all over joint pain.   Family History: Father diagnosed with ALS. Social History: He worked in [employer]. Past medical history:  - Blood clots in 2013 and 2022, currently on blood thinners.    A/P  Cameron Kim is a 52 y.o. male with likely an overlap disease with anti-Jo-1 antibody, Ro antibodies, rheumatoid factor, and CCP positivity.   He has signs and symptoms classic for dermatomyositis with history of heliotropic rash, mechanics hands,  Gottron's papules, proximal muscle weakness, and inflammatory arthritis, currently on prednisone 60 mg. He is planning to get tapered with his local rheumatologist down to 50 mg in the next few weeks.   He is scheduled for a muscle biopsy on Monday, and he will plan to start CellCept on Tuesday after he undergoes the muscle biopsy. We discussed the risks and benefits of CellCept, azathioprine, methotrexate, IVIG and for now the goal will be to start the CellCept and titrate it up slowly until he can tolerate 1500 mg twice daily.  1. Jo-1 positive polymyositis (CMS-HCC) 2. High risk medication use 3. Seropositive RA - mycophenolate (CELLCEPT) 500 mg tablet; Take 1 tablet (500 mg total) by mouth 2 (two) times daily for 14 days, THEN 2 tablets (1,000 mg total) 2 (two) times daily for 14 days, THEN 3 tablets (1,500 mg total) 2 (two) times daily for 90 days. - continue prednisone 60 mg for now, follow taper as suggested by local rheumatologist   OV 08/28/2022  Subjective:  Patient ID: Cameron Kim, male , DOB: Oct 06, 1971 , age 68 y.o. , MRN: [id number] , AD[address]ESS: [address] [place]  [id number] PCP Cameron Minium, MD Patient Care Team: Cameron Minium, MD as PCP - General (Family Medicine) Cameron Harp, MD as PCP - Cardiology (Cardiology) Bo Merino, MD as Consulting Physician (Rheumatology)  This Provider for this visit: Treatment Team:  Attending Provider: Brand Males, MD    08/28/2022 -   Chief Complaint  Patient presents with   Follow-up    Pt states he has been doing okay since last visit.     HPI Cameron Kim 51 y.o. -returns for follow-up.  Since last seeing me he has seen Cameron. Carla Kim at [facility] rheumatology.  Her notes were reviewed and copied and pasted above.  She is diagnosed with combination of dermatomyositis/polymyositis with seropositive rheumatoid arthritis.  She started him on CellCept along with Bactrim.  High-dose  prednisone which is being tapered now by local rheumatologist Cameron Solon [place].  He is beginning to feel better.  His muscle strength is improved.  He did have interstitial lung disease workup.  He had an echocardiogram that shows enlarged pulmonary arteries with suggestion of pulmonary hypertension.  He also had high-resolution CT chest that shows probable UIP [a potential marker for progression].  At this point in time is not endorsing any new complaints.  We did discuss the implications of the findings.  He recollects a left heart catheterization a few years ago at [facility] when he was incarcerated.  But it does not sound like he has had a right heart catheterization.   His current symptoms are as below   SYMPTOM SCALE - ILD 08/28/2022  Current weight   O2 use ra  Shortness of Breath 0 -> 5 scale with 5 being worst (score 6 If unable to do)  At rest 0  Simple tasks - showers, clothes change, eating, shaving 2  Household (dishes, doing bed, laundry) 2  Shopping 2  Walking level at own pace 2  Walking up Stairs 3  Total (30-36) Dyspnea Score 11  How bad is your cough? 0  How bad is your fatigue 3  How bad is nausea 2  How bad is vomiting?  0  How bad is diarrhea? 3  How bad is anxiety? 2  How bad is depression 0  Any chronic pain - if so where and how bad x       CT Chest data  No results found.   Simple office walk 185 feet x  3 laps goal with forehead probe 06/26/2022    O2 used ra   Number laps completed 3   Comments about pace x   Resting Pulse Ox/HR 99% and 85/min   Final Pulse Ox/HR 97% and 105/min   Desaturated </= 88% no   Desaturated <= 3% points no   Got Tachycardic >/= 90/min yes   Symptoms at end of test x   Miscellaneous comments x    No results found for: "NITRICOXIDE"  Latest Reference Range & Units 05/31/22 11:02 06/19/22 16:30 07/31/22 08:40  CK Total 44 - 196 U/L 3,806 (H) 475 (H) 265 (H)  (H): Data is abnormally high  PFT     Latest Ref Rng &  Units 08/10/2022    8:52 AM  PFT Results  FVC-Pre L 2.89  P  FVC-Predicted Pre % 53  P  FVC-Post L 2.95  P  FVC-Predicted Post % 54  P  Pre FEV1/FVC % % 84  P  Post FEV1/FCV % % 84  P  FEV1-Pre L 2.43  P  FEV1-Predicted Pre % 58  P  FEV1-Post L 2.49  P  DLCO uncorrected ml/min/mmHg 14.99  P  DLCO UNC% % 48  P  DLCO corrected ml/min/mmHg 14.87  P  DLCO COR %Predicted % 47  P  DLVA Predicted % 86  P  TLC L 4.20  P  TLC % Predicted % 56  P  RV % Predicted % 64  P    P Preliminary result   HRCT 07/04/22  e & Impression  CLINICAL DATA:  51 year old male history of polymyositis. Evaluate for interstitial lung disease.   EXAM: CT CHEST WITHOUT CONTRAST   TECHNIQUE: Multidetector CT imaging of the chest was performed following the standard protocol without intravenous contrast. High resolution imaging of the lungs, as well as inspiratory and expiratory imaging, was performed.   RADIATION DOSE REDUCTION: This exam was performed according to the departmental dose-optimization program which includes automated exposure control, adjustment of the mA and/or kV according to patient size and/or use of iterative reconstruction technique.   COMPARISON:  CT of the chest 06/08/2022.   FINDINGS: Cardiovascular: Heart size is normal. Trace amount of pericardial fluid and/or thickening, unlikely to be of any hemodynamic significance at this time. No associated pericardial calcification. There is aortic atherosclerosis, as well as atherosclerosis of the great vessels of the mediastinum and the coronary arteries, including calcified atherosclerotic plaque in the left anterior descending coronary artery.   Mediastinum/Nodes: No pathologically enlarged mediastinal or hilar lymph nodes. Please note that accurate exclusion of hilar adenopathy is limited on noncontrast CT scans. Esophagus is unremarkable in appearance. No axillary lymphadenopathy.   Lungs/Pleura: High-resolution images  demonstrate widespread areas of ground-glass attenuation, septal thickening, thickening of the peribronchovascular interstitium, mild cylindrical traction bronchiectasis and peripheral bronchiolectasis  in the lungs bilaterally, most evident in the mid to lower lungs. No frank honeycombing. Inspiratory and expiratory imaging is unremarkable. No acute consolidative airspace disease. No pleural effusions. No definite suspicious appearing pulmonary nodules or masses are noted.   Upper Abdomen: Unremarkable.   Musculoskeletal: There are no aggressive appearing lytic or blastic lesions noted in the visualized portions of the skeleton.   IMPRESSION: 1. The appearance of the lungs is indicative of interstitial lung disease, with a spectrum of findings considered probable usual interstitial pneumonia (UIP) per current ATS guidelines. Repeat high-resolution chest CT is recommended in 12 months to assess for temporal changes in the appearance of the lung parenchyma. 2. Aortic atherosclerosis, in addition to left anterior descending coronary artery disease. Please note that although the presence of coronary artery calcium documents the presence of coronary artery disease, the severity of this disease and any potential stenosis cannot be assessed on this non-gated CT examination. Assessment for potential risk factor modification, dietary therapy or pharmacologic therapy may be warranted, if clinically indicated.   Aortic Atherosclerosis (ICD10-I70.0).     Electronically Signed   By: Vinnie Langton M.D.   On: 07/06/2022 06:57   ECHO 07/30/22   FINDINGS   Left Ventricle: Left ventricular ejection fraction, by estimation, is 60  to 65%. Left ventricular ejection fraction by 3D volume is 60 %. The left  ventricle has normal function. The left ventricle has no regional wall  motion abnormalities. The average  left ventricular global longitudinal strain is -28.8 %. The global  longitudinal  strain is normal. The left ventricular internal cavity size  was normal in size. There is no left ventricular hypertrophy. Left  ventricular diastolic parameters were normal.   Right Ventricle: The right ventricular size is normal. No increase in  right ventricular wall thickness. Right ventricular systolic function is  normal. There is moderately elevated pulmonary artery systolic pressure.  The tricuspid regurgitant velocity is  2.84 m/s, and with an assumed right atrial pressure of 15 mmHg, the  estimated right ventricular systolic pressure is 123456 mmHg.   Left Atrium: Left atrial size was normal in size.   Right Atrium: Right atrial size was normal in size.   Pericardium: There is no evidence of pericardial effusion.   Mitral Valve: The mitral valve is normal in structure. Trivial mitral  valve regurgitation.   Tricuspid Valve: The tricuspid valve is normal in structure. Tricuspid  valve regurgitation is trivial.   Aortic Valve: The aortic valve was not well visualized. Aortic valve  regurgitation is not visualized. No aortic stenosis is present.   Pulmonic Valve: The pulmonic valve was not well visualized. Pulmonic valve  regurgitation is not visualized.   Aorta: The aortic root and ascending aorta are structurally normal, with  no evidence of dilitation.   Venous: The inferior vena cava is dilated in size with less than 50%  respiratory variability, suggesting right atrial pressure of 15 mmHg.   IAS/Shunts: The interatrial septum was not well visualized.    IMMUNOLOGY     Latest Reference Range & Units 05/11/22 09:25 05/31/22 10:26 06/26/22 12:26  Sheep Sorrel IgE kU/L   <0.10  Pecan/Hickory Tree IgE kU/L   <0.10  IgE (Immunoglobulin E), Serum <OR=114 kU/L   22  Allergen, D pternoyssinus,d7 kU/L   <0.10  Cat Dander kU/L   <0.10  Dog Dander kU/L   <0.10  [place] Grass kU/L   <0.10  Johnson Grass kU/L   <0.10  Timothy Grass kU/L   <  0.10  Cockroach kU/L   <0.10   Aspergillus fumigatus, m3 kU/L   <0.10  Allergen, Comm Silver Charletta Cousin, t9 kU/L   <0.10  Allergen, Cottonwood, t14 kU/L   <0.10  Elm IgE kU/L   <0.10  Allergen, Mulberry, t76 kU/L   <0.10  Allergen, Oak,t7 kU/L   <0.10  COMMON RAGWEED (SHORT) (W1) IGE kU/L   <0.10  Allergen, Mouse Urine Protein, e78 kU/L   <0.10  D. farinae kU/L   <0.10  Allergen, Cedar tree, t12 kU/L   <0.10  Box Elder IgE kU/L   0.15 (H)  Rough Pigweed  IgE kU/L   <0.10  Anti Nuclear Antibody (ANA) NEGATIVE  POSITIVE ! POSITIVE !   ANA Pattern 1  Cytoplasmic, Reticular/AMA ! Cytoplasmic, Speckled !   ANA Titer 1 titer 1:1,280 (H) 1:1,280 (H)   Cyclic Citrullin Peptide Ab UNITS  56 (H)   ds DNA Ab IU/mL  <1   Immunoglobulin A 47 - 310 mg/dL  893 (H)   RA Latex Turbid. <14 IU/mL  35 (H)   ENA SM Ab Ser-aCnc <1.0 NEG AI  <1.0 NEG   C3 Complement 82 - 185 mg/dL  810   C4 Complement 15 - 53 mg/dL  23   IgG (Immunoglobin G), Serum 600 - 1,640 mg/dL  1,751 (H)   Ribonucleic Protein(ENA) Antibody, IgG <1.0 NEG AI  <1.0 NEG   SSA (Ro) (ENA) Antibody, IgG <1.0 NEG AI  >8.0 POS !   SSB (La) (ENA) Antibody, IgG <1.0 NEG AI  <1.0 NEG   Scleroderma (Scl-70) (ENA) Antibody, IgG <1.0 NEG AI  <1.0 NEG   (H): Data is abnormally high !: Data is abnormal   has a past medical history of Abnormal EKG, Ankle fracture, left, DVT (deep venous thrombosis) (HCC), Dysrhythmia, Idiopathic inflammatory myopathy, Peripheral vascular disease (HCC) (2013), Polymyositis (HCC), Pulmonary embolism (HCC) (05/2012), and Seizure disorder (HCC) (11/07/2020).   reports that he has never smoked. He has never been exposed to tobacco smoke. He has never used smokeless tobacco.  Past Surgical History:  Procedure Laterality Date   CARDIAC CATHETERIZATION  2021   COLONOSCOPY     I & D EXTREMITY Right 06/07/2016   Procedure: IRRIGATION AND DEBRIDEMENT RIGHT WRIST,MEDIAN NERVE EXPLORATION NEUROLYSIS AND REPAIR;  Surgeon: Dominica Severin, MD;  Location: [facility] OR;   Service: Orthopedics;  Laterality: Right;   medial nerve surgry right arm     MUSCLE BIOPSY Right 07/02/2022   Procedure: RIGHT THIGH MUSCLE BIOPSY;  Surgeon: Griselda Miner, MD;  Location[facility] OR;  Service: General;  Laterality: Right;    No Known Allergies  Immunization History  Administered Date(s) Administered   Influenza,inj,Quad PF,6+ Mos 09/01/2018   Moderna SARS-COV2 Booster Vaccination 11/05/2020   PPD Test 07/18/2022   Tdap 10/08/2004, 12/03/2014    Family History  Problem Relation Age of Onset   Diabetes Mother    Hypertension Mother    Heart murmur Mother    Stroke Mother    Diabetes Father    Hypertension Father    ALS Father    Stroke Sister    Rheum arthritis Brother    Diabetes Paternal Grandmother    Diabetes Paternal Grandfather    Colon cancer Neg Hx    Esophageal cancer Neg Hx    Rectal cancer Neg Hx    Stomach cancer Neg Hx      Current Outpatient Medications:    apixaban (ELIQUIS) 5 MG TABS tablet, Take 1 tablet (5 mg total)  by mouth 2 (two) times daily., Disp: 60 tablet, Rfl: 3   Cyanocobalamin (VITAMIN B-12 PO), Take by mouth daily., Disp: , Rfl:    FOLIC ACID PO, Take 1 tablet by mouth daily., Disp: , Rfl:    mycophenolate (CELLCEPT) 500 MG tablet, Take 1,000 mg by mouth 2 (two) times daily., Disp: , Rfl:    oxyCODONE (ROXICODONE) 5 MG immediate release tablet, Take 1 tablet (5 mg total) by mouth every 6 (six) hours as needed for severe pain., Disp: 10 tablet, Rfl: 0   predniSONE (DELTASONE) 10 MG tablet, Take 4 tabs po qAM and 1 tab po qPM x 1 week, 4 tabs po qd x 1 week, 3.5 tabs po qd x 1 week, 3 tabs po qd x 1 week. Follow taper as discussed in the office., Disp: 109 tablet, Rfl: 0   sulfamethoxazole-trimethoprim (BACTRIM DS) 800-160 MG tablet, Take 1 tablet by mouth 3 (three) times a week., Disp: 36 tablet, Rfl: 0      Objective:   Vitals:   08/28/22 0852  BP: 122/78  Pulse: 87  Temp: 98.2 F (36.8 C)  TempSrc: Oral  SpO2: 96%   Weight: 241 lb 9.6 oz (109.6 kg)  Height: 6' (1.829 m)    Estimated body mass index is 32.77 kg/m as calculated from the following:   Height as of this encounter: 6' (1.829 m).   Weight as of this encounter: 241 lb 9.6 oz (109.6 kg).  @WEIGHTCHANGE @  Filed Weights   08/28/22 0852  Weight: 241 lb 9.6 oz (109.6 kg)     Physical Exam    General: No distress. Looks well Neuro: Alert and Oriented x 3. GCS 15. Speech normal Psych: Pleasant Resp:  Barrel Chest - no.  Wheeze - no, Crackles - fain, No overt respiratory distress CVS: Normal heart sounds. Murmurs - no Ext: Stigmata of Connective Tissue Disease - no HEENT: Normal upper airway. PEERL +. No post nasal drip        Assessment:       ICD-10-CM   1. Interstitial lung disease due to connective tissue disease ([place])  J84.89    M35.9     2. Jo-1 positive polymyositis (HCC)  M33.20     3. Cyclic citrullinated peptide (CCP) antibody positive  R76.8     4. Dermatomyositis affecting respiratory system ([place])  M33.91     5. Seropositive rheumatoid arthritis ([place])  M05.9     6. Immunosuppressed status ([place])  D84.9     7. Current chronic use of systemic steroids  Z79.52     8. Enlarged pulmonary artery (HCC)  I28.8      #Interstitial lung disease: His interstitial lung disease due to connective tissue disease.  December 2023 small study published in a multicenter control trial from Kim EVER-ILD trial showed that combination of CellCept with Rituxan was superior to the CellCept alone in lung function stability over the first 6 months.  I will talk to his rheumatologist about adding Rituxan 2 doses 1000 mg day 1 and day 15 at some point in time in the next few months.  In terms of antifibrotic therapy: Nintedanib is definitely indicated if there is progressive phenotype which she has not yet demonstrated.  Upfront nintedanib is beneficial if there is greater than 10% fibrosis and scleroderma but he does not have  scleroderma.  Therefore at this point we will reserve nintedanib for any progression if he experiences progression.  #Possible pulmonary hypertension: This is demonstrated in the echo.  I have referred him to Cameron. Kirk Ruths McLean/Cameron. Glori Bickers to get right heart cath radiation organized. Plan:     Patient Instructions     ICD-10-CM   1. Interstitial lung disease due to connective tissue disease ([place])  J84.89    M35.9     2. Jo-1 positive polymyositis (HCC)  M33.20     3. Cyclic citrullinated peptide (CCP) antibody positive  R76.8     4. Dermatomyositis affecting respiratory system ([place])  M33.91     5. Seropositive rheumatoid arthritis ([place])  M05.9     6. Immunosuppressed status ([place])  D84.9     7. Current chronic use of systemic steroids  Z79.52     8. Enlarged pulmonary artery (HCC)  I28.8      You have ILD or Pulmonary fibrosis due to your autoimmune condition YOu likely have pulmonary hypertension as well  Plan - I will connect with Cameron. Carla Kim at [facility] to see if he will benefit from 2 doses of Rituxan based on recent ever-ILD trial -Currently we will hold off on any antifibrotic until and unless we document any progression -Refer Cameron. Glori Bickers or Cameron. Loralie Champagne  for right heart catheterization -Repeat spirometry and DLCO in 2-3 months -Continue current medications through rheumatology  Follow-up - Return to see Cameron. Chase Caller for a 30-minute visit in 2-3 months but after spirometry and DLCO  -Symptoms: Walk test at follow-up   ( Level 05 visit: Estb 40-54 min n   in  visit type: on-site physical face to visit  in total care time and counseling or/and coordination of care by this undersigned MD - Cameron Cameron Kim. This includes one or more of the following on this same day 08/28/2022: pre-charting, chart review, note writing, documentation discussion of test results, diagnostic or treatment recommendations, prognosis, risks and benefits of management  options, instructions, education, compliance or risk-factor reduction. It excludes time spent by the [place] or office staff in the care of the patient. Actual time 50 min)   Emailed Cameron Kim and cards via tele notes  SIGNATURE    Cameron. Brand Kim, M.D., F.C.C.P,  Pulmonary and Critical Care Medicine Staff Physician, [place] Director - Interstitial Lung Disease  Program  Pulmonary [place] [facility] [place], [place], [address]  Pager: [phone], If no answer or between  15:00h - 7:00h: call [phone] Telephone: [phone]  9:41 AM 08/28/2022

## 2022-08-28 NOTE — Telephone Encounter (Signed)
Cameron Kim/[name]  Interesting situation.  I think she has had previous?  Pulmonary embolism versus DVT.  He has also got interstitial lung disease due to connective tissue disease.  He has elevated pulmonary pressures on echocardiogram.  Plan - Please consider right heart catheterization [he has a history of left heart catheterization in [place] some years ago]

## 2022-08-28 NOTE — Progress Notes (Signed)
Received call from provider regarding assistance with Eliquis out of pocket cost. Advised I would reach out to inquire and follow up.  Message sent to [facility] in pharmacy and contacted the [facility] pharmacy([place]).  Mcarthur Rossetti advised there is a coupon copay card which could be applied to the account to receive at a much cheaper rate after insurance but wasn't sure of exact cost without the order. She also states they did not have in stock at this time but coupon could be used at any [facility] pharmacy.  Staff message sent to provider and nurse.

## 2022-08-28 NOTE — Patient Instructions (Addendum)
ICD-10-CM   1. Interstitial lung disease due to connective tissue disease (HCC)  J84.89    M35.9     2. Jo-1 positive polymyositis (HCC)  M33.20     3. Cyclic citrullinated peptide (CCP) antibody positive  R76.8     4. Dermatomyositis affecting respiratory system (HCC)  M33.91     5. Seropositive rheumatoid arthritis (HCC)  M05.9     6. Immunosuppressed status (HCC)  D84.9     7. Current chronic use of systemic steroids  Z79.52     8. Enlarged pulmonary artery (HCC)  I28.8      You have ILD or Pulmonary fibrosis due to your autoimmune condition YOu likely have pulmonary hypertension as well  Plan - I will connect with Dr. Steva Colder at [facility] to see if he will benefit from 2 doses of Rituxan based on recent ever-ILD trial -Currently we will hold off on any antifibrotic until and unless we document any progression -Refer Dr. Arvilla Meres or Dr. Marca Ancona  for right heart catheterization -Repeat spirometry and DLCO in 2-3 months -Continue current medications through rheumatology  Follow-up - Return to see Dr. Marchelle Gearing for a 30-minute visit in 2-3 months but after spirometry and DLCO  -Symptoms: Walk test at follow-up

## 2022-08-28 NOTE — Telephone Encounter (Signed)
Per 11/21 los called and left message about appointment

## 2022-08-28 NOTE — Progress Notes (Signed)
[facility] [phone]   [phone]  PROGRESS NOTE  Patient Care Team: Sheliah Hatchabori, Katherine E, MD as PCP - General (Family Medicine) Runell GessBerry, Jonathan J, MD as PCP - Cardiology (Cardiology) Pollyann Savoyeveshwar, Shaili, MD as Consulting Physician (Rheumatology)  Hematological/Oncological History # Recurrent DVTs 1) 05/2012: Diagnosed with pulmonary embolism and left lower leg DVT. Treated with coumadin. Provoking factor felt to be left ankle fracture and prolonged immobility with car travel. (Records not available for review).   2)04/17/2021: Doppler US confirmed acute DVT involving the left popliteal and left gastrocnemius vein. Started on Xarelto.    3) 05/02/2021: Established care with Georga KaufmannIrene Thayil PA-C   Interval History:  Cameron Kim 51 y.o. male with medical history significant for recurrent DVTs who presents for a follow up visit. The patient's last visit was on 05/25/2022 with Georga KaufmannIrene Thayil. In the interim since the last visit is undergone extensive workup with rheumatology and pulmonology.  On exam today [name] reports that he has been taking his Eliquis 5 mg twice daily, however the price has been exorbitant.  He notes it has been $200 for a month supply.  Is something he can afford.  He notes that he is open to talking to a Artistfinancial counselor about this.  He does on the medication he is not having any issues with bleeding, bruising, or dark stools.  Is not having any bleeding of his gums.  He denies any chest pain though he does have some occasional shortness of breath.  He notes that walking up stairs or carrying case of water he can get winded.  He otherwise has had no issues with leg swelling, leg pain, or signs or symptoms concerning for recurrent DVT.  He otherwise denies any fevers, chills, sweats, nausea, vomiting or diarrhea.  Full 10 point ROS is listed below.  MEDICAL HISTORY:  Past Medical History:  Diagnosis Date   Abnormal EKG    Ankle  fracture, left    DVT (deep venous thrombosis) (HCC)    Dysrhythmia    extra beat or skips a beat   Idiopathic inflammatory myopathy    Peripheral vascular disease (HCC) 2013   dvt-pe after ankle fx   Polymyositis (HCC)    Pulmonary embolism (HCC) 05/2012   DVT traveled to lung   Seizure disorder (HCC) 11/07/2020   had 2 but none since not on seizure meds    SURGICAL HISTORY: Past Surgical History:  Procedure Laterality Date   CARDIAC CATHETERIZATION  2021   COLONOSCOPY     I & D EXTREMITY Right 06/07/2016   Procedure: IRRIGATION AND DEBRIDEMENT RIGHT WRIST,MEDIAN NERVE EXPLORATION NEUROLYSIS AND REPAIR;  Surgeon: Dominica SeverinWilliam Gramig, MD;  Location: [facility] OR;  Service: Orthopedics;  Laterality: Right;   medial nerve surgry right arm     MUSCLE BIOPSY Right 07/02/2022   Procedure: RIGHT THIGH MUSCLE BIOPSY;  Surgeon: Griselda Mineroth, Paul III, MD;  [facility];  Service: General;  Laterality: Right;    SOCIAL HISTORY: Social History   Socioeconomic History   Marital status: Married    Spouse name: Not on file   Number of children: 0   Years of education: Not on file   Highest education level: Not on file  Occupational History   Occupation: disabled  Tobacco Use   Smoking status: Never    Passive exposure: Never   Smokeless tobacco: Never  Vaping Use   Vaping Use: Never used  Substance and Sexual Activity   Alcohol use: Not Currently  Drug use: No   Sexual activity: Yes  Other Topics Concern   Not on file  Social History Narrative   Not on file   Social Determinants of Health   Financial Resource Strain: Not on file  Food Insecurity: Not on file  Transportation Needs: Not on file  Physical Activity: Not on file  Stress: Not on file  Social Connections: Not on file  Intimate Partner Violence: Not on file    FAMILY HISTORY: Family History  Problem Relation Age of Onset   Diabetes Mother    Hypertension Mother    Heart murmur Mother    Stroke Mother    Diabetes  Father    Hypertension Father    ALS Father    Stroke Sister    Rheum arthritis Brother    Diabetes Paternal Grandmother    Diabetes Paternal Grandfather    Colon cancer Neg Hx    Esophageal cancer Neg Hx    Rectal cancer Neg Hx    Stomach cancer Neg Hx     ALLERGIES:  has No Known Allergies.  MEDICATIONS:  Current Outpatient Medications  Medication Sig Dispense Refill   apixaban (ELIQUIS) 5 MG TABS tablet Take 1 tablet (5 mg total) by mouth 2 (two) times daily. 60 tablet 3   Cyanocobalamin (VITAMIN B-12 PO) Take by mouth daily.     FOLIC ACID PO Take 1 tablet by mouth daily.     mycophenolate (CELLCEPT) 500 MG tablet Take 1,000 mg by mouth 2 (two) times daily.     oxyCODONE (ROXICODONE) 5 MG immediate release tablet Take 1 tablet (5 mg total) by mouth every 6 (six) hours as needed for severe pain. 10 tablet 0   predniSONE (DELTASONE) 10 MG tablet Take 4 tabs po qAM and 1 tab po qPM x 1 week, 4 tabs po qd x 1 week, 3.5 tabs po qd x 1 week, 3 tabs po qd x 1 week. Follow taper as discussed in the office. 109 tablet 0   sulfamethoxazole-trimethoprim (BACTRIM DS) 800-160 MG tablet Take 1 tablet by mouth 3 (three) times a week. 36 tablet 0   No current facility-administered medications for this visit.    REVIEW OF SYSTEMS:   Constitutional: ( - ) fevers, ( - )  chills , ( - ) night sweats Eyes: ( - ) blurriness of vision, ( - ) double vision, ( - ) watery eyes Ears, nose, mouth, throat, and face: ( - ) mucositis, ( - ) sore throat Respiratory: ( - ) cough, ( - ) dyspnea, ( - ) wheezes Cardiovascular: ( - ) palpitation, ( - ) chest discomfort, ( - ) lower extremity swelling Gastrointestinal:  ( - ) nausea, ( - ) heartburn, ( - ) change in bowel habits Skin: ( - ) abnormal skin rashes Lymphatics: ( - ) new lymphadenopathy, ( - ) easy bruising Neurological: ( - ) numbness, ( - ) tingling, ( - ) new weaknesses Behavioral/Psych: ( - ) mood change, ( - ) new changes  All other systems  were reviewed with the patient and are negative.  PHYSICAL EXAMINATION:  Vitals:   08/28/22 1111  BP: 124/81  Pulse: 90  Resp: 19  Temp: 97.8 F (36.6 C)  SpO2: 99%   Filed Weights   08/28/22 1111  Weight: 243 lb 1.6 oz (110.3 kg)    GENERAL: well appearing middle aged [place] American male. alert, no distress and comfortable SKIN: skin color, texture, turgor are normal, no rashes or  significant lesions EYES: conjunctiva are pink and non-injected, sclera clear LUNGS: clear to auscultation and percussion with normal breathing effort HEART: regular rate & rhythm and no murmurs and no lower extremity edema Musculoskeletal: no cyanosis of digits and no clubbing  PSYCH: alert & oriented x 3, fluent speech NEURO: no focal motor/sensory deficits  LABORATORY DATA:  I have reviewed the data as listed    Latest Ref Rng & Units 08/28/2022   11:01 AM 07/31/2022    8:40 AM 06/26/2022   12:26 PM  CBC  WBC 4.0 - 10.5 K/uL 20.0  21.4  15.4   Hemoglobin 13.0 - 17.0 g/dL 16.1  09.6  04.5   Hematocrit 39.0 - 52.0 % 48.6  44.5  42.9   Platelets 150 - 400 K/uL 324  261  221.0        Latest Ref Rng & Units 08/28/2022   11:01 AM 07/31/2022    8:40 AM 06/19/2022    4:30 PM  CMP  Glucose 70 - 99 mg/dL 88  77  97   BUN 6 - 20 mg/dL 15  21  29    Creatinine 0.61 - 1.24 mg/dL 4.09  8.11  9.14   Sodium 135 - 145 mmol/L 139  143  142   Potassium 3.5 - 5.1 mmol/L 3.9  4.5  4.0   Chloride 98 - 111 mmol/L 103  103  104   CO2 22 - 32 mmol/L 30  30  29    Calcium 8.9 - 10.3 mg/dL 9.5  9.3  9.3   Total Protein 6.5 - 8.1 g/dL 7.4  7.2    Total Bilirubin 0.3 - 1.2 mg/dL 0.4  0.5    Alkaline Phos 38 - 126 U/L 52     AST 15 - 41 U/L 19  13    ALT 0 - 44 U/L 11  16      RADIOGRAPHIC STUDIES: DG SWALLOW FUNC SPEECH PATH  Result Date: 08/14/2022 Table formatting from the original result was not included. Images from the original result were not included. Objective Swallowing Evaluation: Type of  Study: MBS-Modified Barium Swallow Study  Patient Details Name: Cameron Kim MRN: [id number] Date of Birth: Jun 18, 1971 Today's Date: 08/14/2022 Time: SLP Start Time (ACUTE ONLY): 1325 -SLP Stop Time (ACUTE ONLY): 1355 SLP Time Calculation (min) (ACUTE ONLY): 30 min Past Medical History: Past Medical History: Diagnosis Date  Abnormal EKG   Ankle fracture, left   DVT (deep venous thrombosis) (HCC)   Dysrhythmia   extra beat or skips a beat  Idiopathic inflammatory myopathy   Peripheral vascular disease (HCC) 2013  dvt-pe after ankle fx  Polymyositis (HCC)   Pulmonary embolism (HCC) 05/2012  DVT traveled to lung  Seizure disorder (HCC) 11/07/2020  had 2 but none since not on seizure meds Past Surgical History: Past Surgical History: Procedure Laterality Date  CARDIAC CATHETERIZATION  2021  I & D EXTREMITY Right 06/07/2016  Procedure: IRRIGATION AND DEBRIDEMENT RIGHT WRIST,MEDIAN NERVE EXPLORATION NEUROLYSIS AND REPAIR;  Surgeon: Dominica Severin, MD;  Location: [facility] OR;  Service: Orthopedics;  Laterality: Right;  medial nerve surgry right arm    MUSCLE BIOPSY Right 07/02/2022  Procedure: RIGHT THIGH MUSCLE BIOPSY;  Surgeon: Griselda Miner, MD;  Location: [facility] OR;  Service: General;  Laterality: Right; HPI: pt is a 51 yo male referred for OP MBS from Dr Reece Levy.  Pt has recent diagnosis of idiopathic inflammatory myopathy - Jo1 -Ab+, Ro  Ab+,  - resulting in severe muscle weakness, dysarthria,  dysphonia and dysphagia.   Per MD note, pt was placed on prednisone 40 mg BID daily since June 01, 2022.  Pt has h/o seizure d/o, PVD, PE, epistaxis. He reported dysphagia to solids and liquids - and reports nasal tone to voice.  [facility] saw him on Sept 22, 2023. Pt reports he had some dysphagia to both solids and liquids - coughing more with liquids over the last few months that significantly improved after his prednisone treatment. He reports some weight loss during acute illness but has reported gaining weight to near baseline  since his treatment. Denies requiring heimlich manuever nor having pnas -  CT chest showed bilateral lower lobe bronchiectasis and lower lob groundglass opacity.  Subjective: pt awake  Recommendations for follow up therapy are one component of a multi-disciplinary discharge planning process, led by the attending physician.  Recommendations may be updated based on patient status, additional functional criteria and insurance authorization. Assessment / Plan / Recommendation   08/14/2022   1:00 PM Clinical Impressions Clinical Impression Patient presents with normal oropharyngeal swallow ability without aspiration of any consistency tested.   No focal CN deficits present and pt without dysarthria, nor perceived hypernasality.  Trace penetration of thin when swallowing tablet and nectar with first swallow of MBS cleared within the swallow.    Pharyngeal swallow is strong without retention.  Pt noted to clear his throat during MBS after consumption of barium.  Pt reports previously he had to turn his head to swallow - but he does not recall which way he turned his head.   Pt did sense retention in pharynx after swallowing solid - when his pharynx was clear. A-P view conducted showing adequate clearance. SLP administered Reflux Symptom Index (RSI) administered -  and pt scored 27/45.  Per authors highly indicative of reflux to level of pharynx and larynx. Recommend regular/thin diet. SLP Visit Diagnosis Dysphagia, oral phase (R13.11) Impact on safety and function No limitations      No data to display         No data to display      08/14/2022   1:00 PM Diet Recommendations SLP Diet Recommendations Regular solids;Thin liquid Liquid Administration via Straw;Cup Medication Administration Whole meds with liquid Compensations Slow rate;Small sips/bites Postural Changes Seated upright at 90 degrees;Remain semi-upright after after feeds/meals (Comment)     08/14/2022   1:00 PM Other Recommendations Follow Up Recommendations No SLP  follow up    No data to display        08/14/2022   1:00 PM Oral Phase Oral Phase [facility]    08/14/2022   1:00 PM Pharyngeal Phase Pharyngeal Phase WFL;Impaired Pharyngeal- Nectar Cup Penetration/Aspiration during swallow Pharyngeal Material enters airway, remains ABOVE vocal cords then ejected out Pharyngeal- Thin Cup Penetration/Aspiration during swallow Pharyngeal Material enters airway, remains ABOVE vocal cords then ejected out    08/14/2022   1:00 PM Cervical Esophageal Phase  Cervical Esophageal Phase WFL Rolena Infante, MS [facility] SLP Acute Rehab Services Office [phone] Pager [phone] Chales Abrahams 08/14/2022, 2:53 PM                     ECHOCARDIOGRAM COMPLETE  Result Date: 07/30/2022    ECHOCARDIOGRAM REPORT   Patient Name:   Cameron Kim  Date of Exam: 07/30/2022 Medical Rec #:  [id number]     Height:       74.0 in Accession #:    [id number]    Weight:  227.0 lb Date of Birth:  1971/07/06     BSA:          2.294 m Patient Age:    51 years      BP:           135/80 mmHg Patient Gender: M             HR:           81 bpm. Exam Location:  [address] Procedure: 2D Echo, 3D Echo, Cardiac Doppler, Color Doppler and Strain Analysis Indications:    R06.00 Dyspnea  History:        Patient has prior history of Echocardiogram examinations, most                 recent 02/22/2022. Previous Myocardial Infarction,                 Arrythmias:RBBB and PVC; Signs/Symptoms:Edema. DVT. Pericardial                 effusion. H/o pulmonary embolus.  Sonographer:    Jorje Guild BS, RDCS Referring Phys: [id number] MURALI RAMASWAMY IMPRESSIONS  1. Left ventricular ejection fraction, by estimation, is 60 to 65%. Left ventricular ejection fraction by 3D volume is 60 %. The left ventricle has normal function. The left ventricle has no regional wall motion abnormalities. Left ventricular diastolic  parameters were normal. The average left ventricular global longitudinal strain is -28.8 %. The global longitudinal strain is normal.  2.  Right ventricular systolic function is normal. The right ventricular size is normal. There is moderately elevated pulmonary artery systolic pressure. The estimated right ventricular systolic pressure is 47.3 mmHg.  3. The mitral valve is normal in structure. Trivial mitral valve regurgitation.  4. The aortic valve was not well visualized. Aortic valve regurgitation is not visualized. No aortic stenosis is present.  5. The inferior vena cava is dilated in size with <50% respiratory variability, suggesting right atrial pressure of 15 mmHg. Comparison(s): 02/22/22 EF 60-65%. PA pressure . FINDINGS  Left Ventricle: Left ventricular ejection fraction, by estimation, is 60 to 65%. Left ventricular ejection fraction by 3D volume is 60 %. The left ventricle has normal function. The left ventricle has no regional wall motion abnormalities. The average left ventricular global longitudinal strain is -28.8 %. The global longitudinal strain is normal. The left ventricular internal cavity size was normal in size. There is no left ventricular hypertrophy. Left ventricular diastolic parameters were normal. Right Ventricle: The right ventricular size is normal. No increase in right ventricular wall thickness. Right ventricular systolic function is normal. There is moderately elevated pulmonary artery systolic pressure. The tricuspid regurgitant velocity is 2.84 m/s, and with an assumed right atrial pressure of 15 mmHg, the estimated right ventricular systolic pressure is 47.3 mmHg. Left Atrium: Left atrial size was normal in size. Right Atrium: Right atrial size was normal in size. Pericardium: There is no evidence of pericardial effusion. Mitral Valve: The mitral valve is normal in structure. Trivial mitral valve regurgitation. Tricuspid Valve: The tricuspid valve is normal in structure. Tricuspid valve regurgitation is trivial. Aortic Valve: The aortic valve was not well visualized. Aortic valve regurgitation is not  visualized. No aortic stenosis is present. Pulmonic Valve: The pulmonic valve was not well visualized. Pulmonic valve regurgitation is not visualized. Aorta: The aortic root and ascending aorta are structurally normal, with no evidence of dilitation. Venous: The inferior vena cava is dilated in size with less than 50% respiratory variability, suggesting right  atrial pressure of 15 mmHg. IAS/Shunts: The interatrial septum was not well visualized.  LEFT VENTRICLE PLAX 2D LVIDd:         4.80 cm         Diastology LVIDs:         3.10 cm         LV e' medial:    8.41 cm/s LV PW:         0.90 cm         LV E/e' medial:  10.2 LV IVS:        0.90 cm         LV e' lateral:   9.64 cm/s LVOT diam:     2.30 cm         LV E/e' lateral: 8.9 LV SV:         136 LV SV Index:   59              2D LVOT Area:     4.15 cm        Longitudinal                                Strain                                2D Strain GLS  -30.5 %                                (A2C):                                2D Strain GLS  -26.9 %                                (A3C):                                2D Strain GLS  -29.1 %                                (A4C):                                2D Strain GLS  -28.8 %                                Avg:                                 3D Volume EF                                LV 3D EF:    Left  ventricul                                             ar                                             ejection                                             fraction                                             by 3D                                             volume is                                             60 %.                                 3D Volume EF:                                3D EF:        60 %                                LV EDV:       172 ml                                LV ESV:       69 ml                                LV SV:        103 ml RIGHT  VENTRICLE             IVC RV Basal diam:  3.70 cm     IVC diam: 2.20 cm RV S prime:     15.70 cm/s TAPSE (M-mode): 3.0 cm RVSP:           40.3 mmHg LEFT ATRIUM             Index        RIGHT ATRIUM           Index LA diam:        4.10 cm 1.79 cm/m   RA Pressure: 8.00 mmHg LA Vol (A2C):   55.4 ml 24.15 ml/m  RA Area:  16.70 cm LA Vol (A4C):   78.3 ml 34.14 ml/m  RA Volume:   40.80 ml  17.79 ml/m LA Biplane Vol: 67.1 ml 29.25 ml/m  AORTIC VALVE LVOT Vmax:   165.00 cm/s LVOT Vmean:  113.000 cm/s LVOT VTI:    0.327 m  AORTA Ao Root diam: 3.50 cm Ao Asc diam:  3.20 cm MITRAL VALVE                TRICUSPID VALVE                             TR Peak grad:   32.3 mmHg MV Decel Time: 218 msec     TR Vmax:        284.00 cm/s MV E velocity: 85.90 cm/s   Estimated RAP:  8.00 mmHg MV A velocity: 105.00 cm/s  RVSP:           40.3 mmHg MV E/A ratio:  0.82                             SHUNTS                             Systemic VTI:  0.33 m                             Systemic Diam: 2.30 cm Epifanio Lesches MD Electronically signed by Epifanio Lesches MD Signature Date/Time: 07/30/2022/10:10:44 AM    Final     ASSESSMENT & PLAN Cameron Kim is a 51 y.o. male returns for a follow up for recurrent DVTs.    #Recurrent LE DVT: --First episode in 2013 involved PE and DVT in left lower leg following left ankle fracture and prolonged car travel. --Second episode occurred on 04/17/2021 whcih involved DVT of left popliteal vein and the gastrocnemius veins. --There is no provoking factor that caused recent DVT on 04/17/2021 so recommend indefinite anticoagulation. --Discontinued Xarelto around April/May 2023 due to cost.  --Hypercoagulable workup from 05/02/2021 was unremarkable except for mild elevation of anticardiolipin IgM levels. --continue Eliquis 5 mg twice daily --medication is cost inhibitive. Will discuss with financial department to see if there are options to help lower his cost.  --RTC in 6 months    #Leukocytosis-Neutrophilic Predominance #Thrombocytosis-resolved --No infectious symptoms and patient is afebrile today --Sed rate was elevated and positive ANA levels when PCP checked on 05/11/2022 which is concerning for rheumatological process --labs today show WBC 20, Hgb 15.6, Plt 324, Cr 0.8, normal LFTs --Currently following with Dr. Corliss Skains in Rheumatology and [facility]. --had visit with Dr. Marchelle Gearing in Pulmonary, concern for interstitial disease with connective tissue disease.    #Normocytic Anemia-Resolved.  --etiologies include inflammatory process, nutritional deficiencies,  --evidence of B12 and folate deficiency confirmed with labs on 02/08/2022.  --Advised to follow up with PCP to recheck levels to determine if he needs to continue.  No orders of the defined types were placed in this encounter.   All questions were answered. The patient knows to call the clinic with any problems, questions or concerns.  A total of more than 30 minutes were spent on this encounter with face-to-face time and non-face-to-face time, including preparing to see the patient, ordering tests and/or medications, counseling the patient and coordination of care as outlined above.  Cameron Barns, MD Department of Hematology/Oncology [facility] Phone: [phone] Pager: [phone] Email: [email or web address]  08/28/2022 2:11 PM

## 2022-09-04 ENCOUNTER — Telehealth: Payer: Self-pay

## 2022-09-04 ENCOUNTER — Encounter (HOSPITAL_COMMUNITY): Payer: Self-pay | Admitting: *Deleted

## 2022-09-04 ENCOUNTER — Telehealth: Payer: Self-pay | Admitting: Family Medicine

## 2022-09-04 ENCOUNTER — Other Ambulatory Visit (HOSPITAL_COMMUNITY): Payer: Self-pay | Admitting: *Deleted

## 2022-09-04 DIAGNOSIS — J849 Interstitial pulmonary disease, unspecified: Secondary | ICD-10-CM

## 2022-09-04 MED ORDER — SODIUM CHLORIDE 0.9% FLUSH: 3.0000 mL | Freq: Two times a day (BID) | INTRAVENOUS | Status: DC

## 2022-09-04 NOTE — Telephone Encounter (Signed)
Received disability forms by mail. Placed in front bin with charge sheet.

## 2022-09-04 NOTE — Telephone Encounter (Signed)
Heather from [place] called to speak to you about a referral that was sent to them that appears to be for hematology oncology and they have requested a call back to clear up this information   Heather at [phone] Ext. [phone]

## 2022-09-04 NOTE — Telephone Encounter (Signed)
[facility] sch for 12/6 with Dr Shirlee Latch, pt is on Eliquis- per Dr Shirlee Latch hold day before and morning of cath, pt is aware, instructions reviewed by phone and sent through [facility].

## 2022-09-04 NOTE — Telephone Encounter (Signed)
I have faxed over the referral again, those referrals were also placed at the time of this referral

## 2022-09-04 NOTE — Telephone Encounter (Signed)
Forms placed in Dr Tabori to be signed folder  

## 2022-09-05 NOTE — Telephone Encounter (Signed)
Forms need to go to Medical Records.  Returned to [employer]

## 2022-09-05 NOTE — Telephone Encounter (Signed)
Forms faxed to medical records as directed by Dr Beverely Low and placed in scan

## 2022-09-06 ENCOUNTER — Other Ambulatory Visit: Payer: Self-pay

## 2022-09-06 ENCOUNTER — Encounter: Payer: Self-pay | Admitting: Physician Assistant

## 2022-09-06 ENCOUNTER — Ambulatory Visit: Payer: BC Managed Care – PPO | Attending: Physician Assistant | Admitting: Physician Assistant

## 2022-09-06 VITALS — BP 155/85 | HR 87 | Resp 18 | Ht 72.0 in | Wt 247.8 lb

## 2022-09-06 DIAGNOSIS — Z86718 Personal history of other venous thrombosis and embolism: Secondary | ICD-10-CM

## 2022-09-06 DIAGNOSIS — I3139 Other pericardial effusion (noninflammatory): Secondary | ICD-10-CM

## 2022-09-06 DIAGNOSIS — M818 Other osteoporosis without current pathological fracture: Secondary | ICD-10-CM

## 2022-09-06 DIAGNOSIS — M79642 Pain in left hand: Secondary | ICD-10-CM

## 2022-09-06 DIAGNOSIS — Z79899 Other long term (current) drug therapy: Secondary | ICD-10-CM | POA: Diagnosis not present

## 2022-09-06 DIAGNOSIS — M79641 Pain in right hand: Secondary | ICD-10-CM

## 2022-09-06 DIAGNOSIS — I452 Bifascicular block: Secondary | ICD-10-CM

## 2022-09-06 DIAGNOSIS — I7300 Raynaud's syndrome without gangrene: Secondary | ICD-10-CM

## 2022-09-06 DIAGNOSIS — Z7952 Long term (current) use of systemic steroids: Secondary | ICD-10-CM

## 2022-09-06 DIAGNOSIS — E8889 Other specified metabolic disorders: Secondary | ICD-10-CM | POA: Diagnosis not present

## 2022-09-06 DIAGNOSIS — M25511 Pain in right shoulder: Secondary | ICD-10-CM

## 2022-09-06 DIAGNOSIS — G7249 Other inflammatory and immune myopathies, not elsewhere classified: Secondary | ICD-10-CM | POA: Diagnosis not present

## 2022-09-06 DIAGNOSIS — G40909 Epilepsy, unspecified, not intractable, without status epilepticus: Secondary | ICD-10-CM

## 2022-09-06 DIAGNOSIS — M25512 Pain in left shoulder: Secondary | ICD-10-CM

## 2022-09-06 DIAGNOSIS — R600 Localized edema: Secondary | ICD-10-CM

## 2022-09-06 DIAGNOSIS — D72828 Other elevated white blood cell count: Secondary | ICD-10-CM

## 2022-09-06 DIAGNOSIS — T380X5A Adverse effect of glucocorticoids and synthetic analogues, initial encounter: Secondary | ICD-10-CM

## 2022-09-06 DIAGNOSIS — G8929 Other chronic pain: Secondary | ICD-10-CM

## 2022-09-06 DIAGNOSIS — R1319 Other dysphagia: Secondary | ICD-10-CM

## 2022-09-06 DIAGNOSIS — Z86711 Personal history of pulmonary embolism: Secondary | ICD-10-CM

## 2022-09-06 DIAGNOSIS — I252 Old myocardial infarction: Secondary | ICD-10-CM

## 2022-09-06 DIAGNOSIS — J849 Interstitial pulmonary disease, unspecified: Secondary | ICD-10-CM

## 2022-09-06 MED ORDER — PREDNISONE 5 MG PO TABS: ORAL_TABLET | ORAL | 0 refills | Status: DC

## 2022-09-06 MED ORDER — MYCOPHENOLATE MOFETIL 500 MG PO TABS: 1500.0000 mg | ORAL_TABLET | Freq: Two times a day (BID) | ORAL | 2 refills | Status: AC

## 2022-09-06 MED ORDER — PREDNISONE 10 MG PO TABS: ORAL_TABLET | ORAL | 0 refills | Status: DC

## 2022-09-06 NOTE — Telephone Encounter (Signed)
Please review the pended cellcept refill and send to the pharmacy. Thanks!

## 2022-09-06 NOTE — Addendum Note (Signed)
Addended by: Geroge Baseman C on: 09/06/2022 01:29 PM   Modules accepted: Orders

## 2022-09-07 ENCOUNTER — Other Ambulatory Visit: Payer: Self-pay | Admitting: *Deleted

## 2022-09-07 DIAGNOSIS — G7249 Other inflammatory and immune myopathies, not elsewhere classified: Secondary | ICD-10-CM

## 2022-09-07 DIAGNOSIS — Z79899 Other long term (current) drug therapy: Secondary | ICD-10-CM

## 2022-09-07 LAB — CK: Total CK: 956 U/L — ABNORMAL HIGH (ref 44–196)

## 2022-09-07 MED ORDER — HYDROXYCHLOROQUINE SULFATE 200 MG PO TABS: 200.0000 mg | ORAL_TABLET | Freq: Two times a day (BID) | ORAL | 2 refills | Status: AC

## 2022-09-07 NOTE — Progress Notes (Signed)
I called the patient to discuss CK results.  He is CK has trended back up to 956.   Discussed results with Dr. Corliss Skains.    She has recommended that the patient increase prednisone back to 30 mg daily for the next 2 weeks.  He can then try gradually reducing prednisone by 5 mg every week until he has reached 20 mg at which time he will reduce by 2.5 mg every week until he reaches 10 mg at which time he will reduce by 1 mg every 2 weeks if possible.   Discussed adding on Plaquenil as combination therapy. (Also recommended by [facility] previously).  Indications, contraindications, and potential side effects of Plaquenil were discussed today in detail.  All questions were addressed.  Discussed that I will mail an informational handout about Plaquenil to review along with the Plaquenil eye examination form and the consent form.  He was advised to return the consent form to our office once he has signed it.  He was also encouraged to schedule a baseline Plaquenil eye examination.  According to the patient he has an upcoming eye exam scheduled in January 2024. He will notify us if he cannot tolerate taking Plaquenil. He was also advised to notify us if develops any other signs or symptoms of a flare.   Please pend prescription for plaquenil 200 mg 1 tablet by mouth twice daily.   Please mail plaquenil handout, eye exam form, and consent form to the patient.

## 2022-09-07 NOTE — Telephone Encounter (Signed)
-----   Message from Gearldine Bienenstock, PA-C sent at 09/07/2022 11:45 AM EST ----- I called the patient to discuss CK results.  He is CK has trended back up to 956.   Discussed results with Dr. Corliss Skains.    She has recommended that the patient increase prednisone back to 30 mg daily for the next 2 weeks.  He can then try gradually reducing prednisone by 5 mg every week until he has reached 20 mg at which time he will reduce by 2.5 mg every week until he  reaches 10 mg at which time he will reduce by 1 mg every 2 weeks if possible.   Discussed adding on Plaquenil as combination therapy. (Also recommended by [facility] previously).  Indications, contraindications, and potential side effects of Plaquenil were discussed today in detail.  All questions were addressed.  Discussed that  I will mail an informational handout about Plaquenil to review along with the Plaquenil eye examination form and the consent form.  He was advised to return the consent form to our office once he has signed it.  He was also encouraged to schedule a  baseline Plaquenil eye examination.  According to the patient he has an upcoming eye exam scheduled in January 2024. He will notify us if he cannot tolerate taking Plaquenil. He was also advised to notify us if develops any other signs or symptoms of a flare.   Please pend prescription for plaquenil 200 mg 1 tablet by mouth twice daily.   Please mail plaquenil handout, eye exam form, and consent form to the patient.

## 2022-09-11 ENCOUNTER — Telehealth (HOSPITAL_COMMUNITY): Payer: Self-pay

## 2022-09-11 NOTE — Telephone Encounter (Signed)
Called patient regarding procedure scheduled for tomorrow. Aware of nothing to eat or drink after midnight,holding Eliquis day before and day of procedure. Has transportation to and from procedure.

## 2022-09-12 ENCOUNTER — Other Ambulatory Visit: Payer: Self-pay

## 2022-09-12 ENCOUNTER — Ambulatory Visit (HOSPITAL_COMMUNITY)
Admission: RE | Admit: 2022-09-12 | Discharge: 2022-09-12 | Disposition: A | Discharge status: Home / Self Care | Payer: BC Managed Care – PPO | Source: Ambulatory Visit | Attending: Cardiology | Admitting: Cardiology

## 2022-09-12 ENCOUNTER — Encounter (HOSPITAL_COMMUNITY): Payer: Self-pay | Admitting: Cardiology

## 2022-09-12 ENCOUNTER — Encounter (HOSPITAL_COMMUNITY)
Admission: RE | Disposition: A | Discharge status: Home / Self Care | Payer: Self-pay | Source: Ambulatory Visit | Attending: Cardiology

## 2022-09-12 DIAGNOSIS — I272 Pulmonary hypertension, unspecified: Secondary | ICD-10-CM | POA: Diagnosis not present

## 2022-09-12 DIAGNOSIS — J849 Interstitial pulmonary disease, unspecified: Secondary | ICD-10-CM

## 2022-09-12 HISTORY — PX: RIGHT HEART CATH: CATH118263

## 2022-09-12 SURGERY — RIGHT HEART CATH
Anesthesia: LOCAL

## 2022-09-12 MED ORDER — SODIUM CHLORIDE 0.9 % IV SOLN: INTRAVENOUS | Status: DC

## 2022-09-12 MED ORDER — HYDRALAZINE HCL 20 MG/ML IJ SOLN: 10.0000 mg | INTRAMUSCULAR | Status: DC | PRN

## 2022-09-12 MED ORDER — ONDANSETRON HCL 4 MG/2ML IJ SOLN: 4.0000 mg | Freq: Four times a day (QID) | INTRAMUSCULAR | Status: DC | PRN

## 2022-09-12 MED ORDER — SODIUM CHLORIDE 0.9 % IV SOLN: 250.0000 mL | INTRAVENOUS | Status: DC | PRN

## 2022-09-12 MED ORDER — LIDOCAINE HCL (PF) 1 % IJ SOLN
INTRAMUSCULAR | Status: DC | PRN
  Administered 2022-09-12: 2 mL

## 2022-09-12 MED ORDER — HEPARIN (PORCINE) IN NACL 1000-0.9 UT/500ML-% IV SOLN
INTRAVENOUS | Status: DC | PRN
  Administered 2022-09-12: 500 mL

## 2022-09-12 MED ORDER — SODIUM CHLORIDE 0.9% FLUSH: 3.0000 mL | INTRAVENOUS | Status: DC | PRN

## 2022-09-12 MED ORDER — ACETAMINOPHEN 325 MG PO TABS: 650.0000 mg | ORAL_TABLET | ORAL | Status: DC | PRN

## 2022-09-12 MED ORDER — HEPARIN (PORCINE) IN NACL 1000-0.9 UT/500ML-% IV SOLN
INTRAVENOUS | Status: AC
  Filled 2022-09-12: qty 500

## 2022-09-12 MED ORDER — LABETALOL HCL 5 MG/ML IV SOLN: 10.0000 mg | INTRAVENOUS | Status: DC | PRN

## 2022-09-12 MED ORDER — ASPIRIN 81 MG PO CHEW
81.0000 mg | CHEWABLE_TABLET | ORAL | Status: AC
  Administered 2022-09-12: 81 mg via ORAL
  Filled 2022-09-12: qty 1

## 2022-09-12 MED ORDER — SODIUM CHLORIDE 0.9% FLUSH: 3.0000 mL | Freq: Two times a day (BID) | INTRAVENOUS | Status: DC

## 2022-09-12 MED ORDER — LIDOCAINE HCL (PF) 1 % IJ SOLN
INTRAMUSCULAR | Status: AC
  Filled 2022-09-12: qty 30

## 2022-09-12 SURGICAL SUPPLY — 6 items
CATH SWAN GANZ 7F STRAIGHT (CATHETERS) IMPLANT
GLIDESHEATH SLENDER 7FR .021G (SHEATH) IMPLANT
GUIDEWIRE .025 260CM (WIRE) IMPLANT
KIT HEART LEFT (KITS) ×1 IMPLANT
PACK CARDIAC CATHETERIZATION (CUSTOM PROCEDURE TRAY) ×1 IMPLANT
TRANSDUCER W/STOPCOCK (MISCELLANEOUS) ×1 IMPLANT

## 2022-09-12 NOTE — Discharge Instructions (Signed)
Brachial Site Care   This sheet gives you information about how to care for yourself after your procedure. Your health care provider may also give you more specific instructions. If you have problems or questions, contact your health care provider. What can I expect after the procedure? After the procedure, it is common to have: Bruising and tenderness at the catheter insertion area. Follow these instructions at home: Medicines Take over-the-counter and prescription medicines only as told by your health care provider. Insertion site care Follow instructions from your health care provider about how to take care of your insertion site. Make sure you: Wash your hands with soap and water before you change your bandage (dressing). If soap and water are not available, use hand sanitizer. Remove your dressing as told by your health care provider. In 24 hours Check your insertion site every day for signs of infection. Check for: Redness, swelling, or pain. Fluid or blood. Pus or a bad smell. Warmth. Do not take baths, swim, or use a hot tub until your health care provider approves. You may shower 24-48 hours after the procedure, or as directed by your health care provider. Remove the dressing and gently wash the site with plain soap and water. Pat the area dry with a clean towel. Do not rub the site. That could cause bleeding. Do not apply powder or lotion to the site. Activity   For 24 hours after the procedure, or as directed by your health care provider: Do not push or pull heavy objects with the affected arm. Do not drive yourself home from the hospital or clinic. You may drive 24 hours after the procedure unless your health care provider tells you not to. Do not operate machinery or power tools. Do not lift anything that is heavier than 10 lb (4.5 kg), or the limit that you are told, until your health care provider says that it is safe.  For 24 hours Ask your health care provider  when it is okay to: Return to work or school. Resume usual physical activities or sports. Resume sexual activity. General instructions If the catheter site starts to bleed, raise your arm and put firm pressure on the site.  If you went home on the same day as your procedure, a responsible adult should be with you for the first 24 hours after you arrive home. Keep all follow-up visits as told by your health care provider. This is important. Contact a health care provider if: You have a fever. You have redness, swelling, or yellow drainage around your insertion site. Get help right away if: You have unusual pain at the brachial site. The catheter insertion area swells very fast. The insertion area is bleeding, and the bleeding does not stop when you hold steady pressure on the area. These symptoms may represent a serious problem that is an emergency. Do not wait to see if the symptoms will go away. Get medical help right away. Call your local emergency services (911 in the [place] Do not drive yourself to the hospital. Summary After the procedure, it is common to have bruising and tenderness at the site. Follow instructions from your health care provider about how to take care of your radial site wound. Check the wound every day for signs of infection. Do not lift anything that is heavier than 10 lb (4.5 kg), or the limit that you are told, until your health care provider says that it is safe. This information is not intended to replace advice  given to you by your health care provider. Make sure you discuss any questions you have with your health care provider. Document Revised: 10/30/2017 Document Reviewed: 10/30/2017 Elsevier Patient Education  2020 [employer].

## 2022-09-13 LAB — POCT I-STAT EG7
Acid-Base Excess: 2 mmol/L (ref 0.0–2.0)
Acid-Base Excess: 3 mmol/L — ABNORMAL HIGH (ref 0.0–2.0)
Acid-Base Excess: 3 mmol/L — ABNORMAL HIGH (ref 0.0–2.0)
Bicarbonate: 28.1 mmol/L — ABNORMAL HIGH (ref 20.0–28.0)
Bicarbonate: 28.5 mmol/L — ABNORMAL HIGH (ref 20.0–28.0)
Bicarbonate: 28.6 mmol/L — ABNORMAL HIGH (ref 20.0–28.0)
Calcium, Ion: 1.23 mmol/L (ref 1.15–1.40)
Calcium, Ion: 1.25 mmol/L (ref 1.15–1.40)
Calcium, Ion: 1.25 mmol/L (ref 1.15–1.40)
HCT: 43 % (ref 39.0–52.0)
HCT: 44 % (ref 39.0–52.0)
HCT: 45 % (ref 39.0–52.0)
Hemoglobin: 14.6 g/dL (ref 13.0–17.0)
Hemoglobin: 15 g/dL (ref 13.0–17.0)
Hemoglobin: 15.3 g/dL (ref 13.0–17.0)
O2 Saturation: 72 %
O2 Saturation: 75 %
O2 Saturation: 74 %
Potassium: 3.6 mmol/L (ref 3.5–5.1)
Potassium: 3.7 mmol/L (ref 3.5–5.1)
Potassium: 3.7 mmol/L (ref 3.5–5.1)
Sodium: 140 mmol/L (ref 135–145)
Sodium: 141 mmol/L (ref 135–145)
Sodium: 140 mmol/L (ref 135–145)
TCO2: 30 mmol/L (ref 22–32)
TCO2: 30 mmol/L (ref 22–32)
TCO2: 30 mmol/L (ref 22–32)
pCO2, Ven: 46.8 mmHg (ref 44–60)
pCO2, Ven: 47.7 mmHg (ref 44–60)
pCO2, Ven: 47.6 mmHg (ref 44–60)
pH, Ven: 7.378 (ref 7.25–7.43)
pH, Ven: 7.392 (ref 7.25–7.43)
pH, Ven: 7.387 (ref 7.25–7.43)
pO2, Ven: 39 mmHg (ref 32–45)
pO2, Ven: 41 mmHg (ref 32–45)
pO2, Ven: 40 mmHg (ref 32–45)

## 2022-09-14 ENCOUNTER — Other Ambulatory Visit: Payer: Self-pay | Admitting: Physician Assistant

## 2022-09-14 ENCOUNTER — Ambulatory Visit (INDEPENDENT_AMBULATORY_CARE_PROVIDER_SITE_OTHER): Payer: BC Managed Care – PPO | Admitting: Family Medicine

## 2022-09-14 ENCOUNTER — Encounter: Payer: Self-pay | Admitting: Family Medicine

## 2022-09-14 VITALS — BP 138/80 | HR 105 | Temp 98.4°F | Resp 17 | Ht 72.0 in | Wt 249.4 lb

## 2022-09-14 DIAGNOSIS — I8001 Phlebitis and thrombophlebitis of superficial vessels of right lower extremity: Secondary | ICD-10-CM | POA: Diagnosis not present

## 2022-09-14 DIAGNOSIS — N529 Male erectile dysfunction, unspecified: Secondary | ICD-10-CM | POA: Diagnosis not present

## 2022-09-14 MED ORDER — SILDENAFIL CITRATE 100 MG PO TABS: 50.0000 mg | ORAL_TABLET | Freq: Every day | ORAL | 11 refills | Status: DC | PRN

## 2022-09-14 MED ORDER — CEPHALEXIN 500 MG PO CAPS: 500.0000 mg | ORAL_CAPSULE | Freq: Two times a day (BID) | ORAL | 0 refills | Status: AC

## 2022-09-14 NOTE — Assessment & Plan Note (Signed)
New.  Pt reports sxs started ~5 days ago.  Area is more red than he remembers and he doesn't remember seeing 'a line' before today.  Will start Keflex as he is already on Bactrim 3x/week.  Encouraged hot compresses and elevation.  Pt expressed understanding and is in agreement w/ plan.

## 2022-09-14 NOTE — Progress Notes (Signed)
   Subjective:    Patient ID: Cameron Kim, male    DOB: 08-02-1971, 51 y.o.   MRN: [id number]  HPI ED- pt reports this has been going on for months.  Has not thought about it recently bc of all the other medical issues going on.  Pt is rarely having erections at this point.  Previously on Viagra 100mg  as needed.  Pt reports this was helpful.    Foot pain- R foot.  Top of foot.  First noticed ~5 days ago.  Area is red and TTP.   Review of Systems For ROS see HPI     Objective:   Physical Exam Vitals reviewed.  Constitutional:      General: He is not in acute distress.    Appearance: Normal appearance. He is obese. He is not ill-appearing.  Cardiovascular:     Pulses: Normal pulses.  Musculoskeletal:        General: Tenderness (pt has prominent, tender vein across top of R foot that is red and warm to touch w/ spreading redness laterally following path of vein) present.  Skin:    General: Skin is warm and dry.  Neurological:     Mental Status: He is alert and oriented to person, place, and time. Mental status is at baseline.           Assessment & Plan:

## 2022-09-14 NOTE — Patient Instructions (Addendum)
Follow up as needed or as scheduled Use the Sildenafil (Viagra) as needed- take at least 1 hr prior to sexual activity and it can last for up to 24 hrs START the Cephalexin (Keflex) twice daily x7 days for the inflammation/infection on your foot Apply hot compresses to the top of your foot to help decrease inflammation Call with any questions or concerns Stay Safe!  Stay Healthy! Happy Holidays!!

## 2022-09-14 NOTE — Assessment & Plan Note (Signed)
Deteriorated.  Pt reports he hasn't thought about this in months with all of his other medical issues but he is having a hard time having any sort of erection.  Wants to restart Viagra.  Prescription sent.

## 2022-09-17 ENCOUNTER — Other Ambulatory Visit: Payer: Self-pay | Admitting: *Deleted

## 2022-09-17 MED ORDER — PREDNISONE 10 MG PO TABS: 30.0000 mg | ORAL_TABLET | Freq: Every day | ORAL | 0 refills | Status: DC

## 2022-09-17 NOTE — Telephone Encounter (Signed)
Attempted to contact patient to clarify dosage. Mailbox is full. Unable to leave message.

## 2022-09-17 NOTE — Telephone Encounter (Signed)
Mailbox full

## 2022-09-17 NOTE — Telephone Encounter (Signed)
I called patient, patient verbalized understanding. 

## 2022-09-17 NOTE — Telephone Encounter (Signed)
Please call patient regarding FMLA paperwork. Thank youl

## 2022-09-17 NOTE — Telephone Encounter (Signed)
Next Visit:  Return in about 6 weeks (around 10/18/2022) for DM, ILD .   Last Visit: 09/06/2022  Last Fill:09/06/2022  Dx: Idiopathic inflammatory myopathy-Jo-1 Ab +, Ro Ab  Current Dose per office note on 09/06/2022: I called patient, patient states he is taking 30 mg  Okay to refill Prednisone?

## 2022-09-17 NOTE — Telephone Encounter (Signed)
Please call patient to schedule appt  Thank you!

## 2022-09-17 NOTE — Telephone Encounter (Signed)
I sent in a prescription for prednisone 10 mg 3 tablets daily (30 mg total).   We will determine the rate he can taper his prednisone pending lab results.    Please advise the patient to return later this week or early next week to have CK rechecked.

## 2022-09-18 ENCOUNTER — Encounter (HOSPITAL_COMMUNITY): Payer: Self-pay

## 2022-09-21 ENCOUNTER — Other Ambulatory Visit: Payer: Self-pay | Admitting: *Deleted

## 2022-09-21 DIAGNOSIS — Z79899 Other long term (current) drug therapy: Secondary | ICD-10-CM

## 2022-09-21 DIAGNOSIS — G7249 Other inflammatory and immune myopathies, not elsewhere classified: Secondary | ICD-10-CM

## 2022-09-24 DIAGNOSIS — Z79899 Other long term (current) drug therapy: Secondary | ICD-10-CM | POA: Diagnosis not present

## 2022-09-24 DIAGNOSIS — G7249 Other inflammatory and immune myopathies, not elsewhere classified: Secondary | ICD-10-CM | POA: Diagnosis not present

## 2022-09-25 LAB — CK: Total CK: 826 U/L — ABNORMAL HIGH (ref 44–196)

## 2022-09-25 NOTE — Progress Notes (Signed)
I had a detailed conversation with the patient.  I discussed his lab results including CK values.  He did not have great improvement in his CK values on increased dose of prednisone.  He stated that he has not missed any dose of prednisone or CellCept.  He states he is not having any muscular weakness, shortness of breath or dysphagia.  He states the rash has improved since he is taking prednisone 30 mg p.o. daily.  He does not want to increase the doses of prednisone at this time.  He has an appointment with [facility] on January 22 at 8:00 in the morning.  He plans to go for the appointment at [facility].  He will also return here for the follow-up visit on October 18, 2022.  We will continue current treatment.  I advised him to contact us if there are any change in his symptoms.

## 2022-09-25 NOTE — Progress Notes (Signed)
Please advise 

## 2022-10-10 ENCOUNTER — Encounter: Payer: Self-pay | Admitting: Orthopedic Surgery

## 2022-10-10 ENCOUNTER — Ambulatory Visit (INDEPENDENT_AMBULATORY_CARE_PROVIDER_SITE_OTHER): Payer: BC Managed Care – PPO | Admitting: Orthopedic Surgery

## 2022-10-10 DIAGNOSIS — M12812 Other specific arthropathies, not elsewhere classified, left shoulder: Secondary | ICD-10-CM | POA: Diagnosis not present

## 2022-10-10 DIAGNOSIS — M12811 Other specific arthropathies, not elsewhere classified, right shoulder: Secondary | ICD-10-CM

## 2022-10-10 NOTE — Progress Notes (Signed)
Office Visit Note   Patient: Cameron Kim           Date of Birth: 08/05/1971           MRN: [id number] Visit Date: 10/10/2022 Requested by: Midge Minium, MD [address] [place],  [place] [address] PCP: Midge Minium, MD  Subjective: Chief Complaint  Patient presents with   Right Shoulder - Follow-up    HPI: Cameron Kim is a 52 y.o. male who presents to the office reporting right shoulder pain.  Has known history of right shoulder arthritis and rotator cuff arthropathy.  Reports decreased range of motion but states his mobility is actually improved some clinically.  He currently has elevated CK and is being treated with prednisone 30 mg daily.  Recently had right heart catheterization which showed no increase or need for treatment for pulmonary hypertension.  Hard for him to reach out in front of him..                ROS: All systems reviewed are negative as they relate to the chief complaint within the history of present illness.  Patient denies fevers or chills.  Assessment & Plan: Visit Diagnoses:  1. Rotator cuff arthropathy of both shoulders     Plan: Impression is right shoulder arthritis and rotator cuff arthropathy.  He has had CT scan but currently is taking a lot of prednisone.  Not sure this is the best time for elective shoulder surgery.  Would like to see him taper down a little bit more off of his prednisone.  Follow-up in 2 more months so we can see where he is at and potentially plan for surgery at that time.  May need repeat CT scan at that time based on timing of the replacement.  Follow-Up Instructions: No follow-ups on file.   Orders:  No orders of the defined types were placed in this encounter.  No orders of the defined types were placed in this encounter.     Procedures: No procedures performed   Clinical Data: No additional findings.  Objective: Vital Signs: There were no vitals taken for this visit.  Physical Exam:   Constitutional: Patient appears well-developed HEENT:  Head: Normocephalic Eyes:EOM are normal Neck: Normal range of motion Cardiovascular: Normal rate Pulmonary/chest: Effort normal Neurologic: Patient is alert Skin: Skin is warm Psychiatric: Patient has normal mood and affect  Ortho Exam: Ortho exam demonstrates range of motion of the right of 60/95/130.  Range of motion on the left is 60/95/130.  He has less than 90 degrees of active forward flexion and active abduction on the right-hand side.  On the left he has greater than 90 degrees of active forward flexion and active abduction.  Deltoid is functional bilaterally.  Specialty Comments:  No specialty comments available.  Imaging: No results found.   PMFS History: Patient Active Problem List   Diagnosis Date Noted   Thrombophlebitis of superficial veins of right lower extremity 09/14/2022   Interstitial lung disease ([place] [facility]) 06/24/2022   History of DVT (deep vein thrombosis) 05/28/2022   Low serum albumin 05/13/2022   Pericardial effusion 02/28/2022   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   Unintentional weight loss 02/11/2022   Lower extremity edema 02/11/2022   Dysarthria 02/11/2022   Weakness generalized 02/11/2022   Chronic pain of both shoulders 02/11/2022   Dry skin 02/11/2022   History of non-ST elevation myocardial infarction (NSTEMI) 02/11/2022   Acute deep vein thrombosis (DVT) of lower extremity ([place])  05/02/2021   Seizure disorder ([place]) 11/07/2020   Epistaxis 06/15/2020   Erectile dysfunction 10/31/2017   Adjustment disorder with depressed mood 03/25/2017   Glucose intolerance 02/25/2017   Physical exam 12/03/2014   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 fascicular block 01/18/2014   Hx of pulmonary embolus 01/13/2014   Soft tissue mass 01/13/2014   Overweight (BMI 25.0-29.9) 01/13/2014   Allergic rhinitis 01/13/2014   Past Medical History:  Diagnosis Date   Abnormal EKG    Ankle fracture, left    DVT  (deep venous thrombosis) ([facility])    Dysrhythmia    extra beat or skips a beat   Idiopathic inflammatory myopathy    Peripheral vascular disease ([place]) 2013   dvt-pe after ankle fx   Polymyositis ([facility])    Pulmonary embolism ([place]) 05/2012   DVT traveled to lung   Seizure disorder ([place]) 11/07/2020   had 2 but none since not on seizure meds    Family History  Problem Relation Age of Onset   Diabetes Mother    Hypertension Mother    Heart murmur Mother    Stroke Mother    Diabetes Father    Hypertension Father    ALS Father    Stroke Sister    Rheum arthritis Brother    Diabetes Paternal Grandmother    Diabetes Paternal Grandfather    Colon cancer Neg Hx    Esophageal cancer Neg Hx    Rectal cancer Neg Hx    Stomach cancer Neg Hx     Past Surgical History:  Procedure Laterality Date   CARDIAC CATHETERIZATION  2021   COLONOSCOPY     I & D EXTREMITY Right 06/07/2016   Procedure: IRRIGATION AND DEBRIDEMENT RIGHT WRIST,MEDIAN NERVE EXPLORATION NEUROLYSIS AND REPAIR;  Surgeon: Roseanne Kaufman, MD;  Location: [facility];  Service: Orthopedics;  Laterality: Right;   medial nerve surgry right arm     MUSCLE BIOPSY Right 07/02/2022   Procedure: RIGHT THIGH MUSCLE BIOPSY;  Surgeon: Jovita Kussmaul, MD;  Location: [facility];  Service: General;  Laterality: Right;   RIGHT HEART CATH N/A 09/12/2022   Procedure: RIGHT HEART CATH;  Surgeon: Larey Dresser, MD;  Location: [facility];  Service: Cardiovascular;  Laterality: N/A;   Social History   Occupational History   Occupation: disabled  Tobacco Use   Smoking status: Never    Passive exposure: Never   Smokeless tobacco: Never  Vaping Use   Vaping Use: Never used  Substance and Sexual Activity   Alcohol use: Not Currently   Drug use: No   Sexual activity: Yes

## 2022-10-11 NOTE — Progress Notes (Unsigned)
Office Visit Note  Patient: Cameron Kim             Date of Birth: [birth year]           MRN: [id number]             PCP: Midge Minium, MD Referring: Midge Minium, MD Visit Date: 10/18/2022 Occupation: @GUAROCC @  Subjective:  Medication monitoring   History of Present Illness: Cameron Kim is a 52 y.o. male with history of idiopathic inflammatory myopathy and ILD.  Patient is currently taking Plaquenil 200mg  BID,Cellcept 500 mg 3 tablets by mouth twice daily, Bactrim 3 days weekly, and prednisone 30 mg daily.  He has been tolerating combination therapy without any side effects.  He has not missed any doses of Plaquenil, CellCept, or prednisone recently.  Patient reports that he has noticed some increased generalized weakness but is unsure if it is due to deconditioning or a myositis flare.  He denies any increased dysphagia.  He has some shortness of breath on exertion but denies any other new pulmonary symptoms.  He has an upcoming appointment with Dr. Chase Caller on 11/06/2022.  He will be having updated PFTs at that time.  He also has an upcoming appointment with [facility] rheumatology on 10/29/2022.  He has had some increased arthralgias and joint stiffness but denies any joint swelling.  He continues to experience intermittent rashes as well as an increased frequency of Raynaud's phenomenon in his fingertips and toes with the cooler weather temperatures. He denies any recent or recurrent infections.    Activities of Daily Living:  Patient reports morning stiffness for all day.  Patient Denies nocturnal pain.  Difficulty dressing/grooming: Reports Difficulty climbing stairs: Reports Difficulty getting out of chair: Reports Difficulty using hands for taps, buttons, cutlery, and/or writing: Reports  Review of Systems  Constitutional:  Positive for fatigue.  HENT:  Negative for mouth sores and mouth dryness.   Eyes:  Positive for visual disturbance. Negative for dryness.   Respiratory:  Positive for shortness of breath.   Cardiovascular:  Positive for palpitations. Negative for chest pain.  Gastrointestinal:  Positive for constipation and diarrhea. Negative for blood in stool.  Endocrine: Negative for increased urination.  Genitourinary:  Negative for involuntary urination.  Musculoskeletal:  Positive for morning stiffness. Negative for joint pain, gait problem, joint pain, joint swelling, myalgias, muscle weakness, muscle tenderness and myalgias.  Skin:  Positive for color change and rash. Negative for sensitivity to sunlight.  Allergic/Immunologic: Negative for susceptible to infections.  Neurological:  Positive for numbness and headaches. Negative for dizziness.  Hematological:  Negative for swollen glands.  Psychiatric/Behavioral:  Positive for sleep disturbance. Negative for depressed mood. The patient is not nervous/anxious.     PMFS History:  Patient Active Problem List   Diagnosis Date Noted   Thrombophlebitis of superficial veins of right lower extremity 09/14/2022   Interstitial lung disease ([place]) 06/24/2022   History of DVT (deep vein thrombosis) 05/28/2022   Low serum albumin 05/13/2022   Pericardial effusion 02/28/2022   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   Unintentional weight loss 02/11/2022   Lower extremity edema 02/11/2022   Dysarthria 02/11/2022   Weakness generalized 02/11/2022   Chronic pain of both shoulders 02/11/2022   Dry skin 02/11/2022   History of non-ST elevation myocardial infarction (NSTEMI) 02/11/2022   Acute deep vein thrombosis (DVT) of lower extremity ([place]) 05/02/2021   Seizure disorder ([place]) 11/07/2020   Epistaxis 06/15/2020   Erectile dysfunction 10/31/2017   Adjustment disorder with  depressed mood 03/25/2017   Glucose intolerance 02/25/2017   Physical exam 12/03/2014   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 fascicular block 01/18/2014   Hx of pulmonary embolus 01/13/2014   Soft tissue mass 01/13/2014    Overweight (BMI 25.0-29.9) 01/13/2014   Allergic rhinitis 01/13/2014    Past Medical History:  Diagnosis Date   Abnormal EKG    Ankle fracture, left    DVT (deep venous thrombosis) (HCC)    Dysrhythmia    extra beat or skips a beat   Idiopathic inflammatory myopathy    Peripheral vascular disease (HCC) 2013   dvt-pe after ankle fx   Polymyositis (HCC)    Pulmonary embolism (HCC) 05/2012   DVT traveled to lung   Seizure disorder (HCC) 11/07/2020   had 2 but none since not on seizure meds    Family History  Problem Relation Age of Onset   Diabetes Mother    Hypertension Mother    Heart murmur Mother    Stroke Mother    Diabetes Father    Hypertension Father    ALS Father    Stroke Sister    Rheum arthritis Brother    Diabetes Paternal Grandmother    Diabetes Paternal Grandfather    Colon cancer Neg Hx    Esophageal cancer Neg Hx    Rectal cancer Neg Hx    Stomach cancer Neg Hx    Past Surgical History:  Procedure Laterality Date   CARDIAC CATHETERIZATION  2021   COLONOSCOPY     I & D EXTREMITY Right 06/07/2016   Procedure: IRRIGATION AND DEBRIDEMENT RIGHT WRIST,MEDIAN NERVE EXPLORATION NEUROLYSIS AND REPAIR;  Surgeon: Dominica Severin, MD;  Location: [facility] OR;  Service: Orthopedics;  Laterality: Right;   medial nerve surgry right arm     MUSCLE BIOPSY Right 07/02/2022   Procedure: RIGHT THIGH MUSCLE BIOPSY;  Surgeon: Griselda Miner, MD;  Location: [facility] OR;  Service: General;  Laterality: Right;   RIGHT HEART CATH N/A 09/12/2022   Procedure: RIGHT HEART CATH;  Surgeon: Laurey Morale, MD;  Location: [facility] INVASIVE CV LAB;  Service: Cardiovascular;  Laterality: N/A;   Social History   Social History Narrative   Not on file   Immunization History  Administered Date(s) Administered   Influenza,inj,Quad PF,6+ Mos 09/01/2018   Moderna SARS-COV2 Booster Vaccination 11/05/2020   PPD Test 07/18/2022   Tdap 10/08/2004, 12/03/2014     Objective: Vital Signs: BP 138/88 (BP  Location: Left Arm, Patient Position: Sitting, Cuff Size: Large)   Pulse (!) 106   Resp 18   Ht 6' (1.829 m)   Wt 259 lb (117.5 kg)   BMI 35.13 kg/m    Physical Exam Vitals and nursing note reviewed.  Constitutional:      Appearance: He is well-developed.  HENT:     Head: Normocephalic and atraumatic.  Eyes:     Conjunctiva/sclera: Conjunctivae normal.     Pupils: Pupils are equal, round, and reactive to light.  Cardiovascular:     Rate and Rhythm: Normal rate and regular rhythm.     Heart sounds: Normal heart sounds.  Pulmonary:     Effort: Pulmonary effort is normal.     Breath sounds: Normal breath sounds.  Abdominal:     General: Bowel sounds are normal.     Palpations: Abdomen is soft.  Musculoskeletal:     Cervical back: Normal range of motion and neck supple.  Skin:    General: Skin is warm and dry.  Capillary Refill: Capillary refill takes less than 2 seconds.     Comments: Mild cyanosis of right 5th fingertip. No digital ulcerations.    Neurological:     Mental Status: He is alert and oriented to person, place, and time.  Psychiatric:        Behavior: Behavior normal.      Musculoskeletal Exam: C-spine, thoracic spine, and lumbar spine have good ROM.  Right shoulder has about 60 degrees of forward flexion and abduction.  Left shoulder has about 110 degrees of forward flexion and abduction.  Elbow joints have good ROM with no tenderness or joint swelling.  Wrist joints, MCPs, PIPs, and DIPs good ROM with no synovitis.  Complete fist formation bilaterally.  Hip joints have good ROM with some discomfort bilaterally.  Knee joints have good ROM with no warmth or effusion.  Ankle joints have good ROM with no tenderness or joint swelling.  Difficulty rising from a squatting position.  Patient was able to rise from seated position without assistance.    CDAI Exam: CDAI Score: -- Patient Global: --; Provider Global: -- Swollen: --; Tender: -- Joint Exam 10/18/2022    No joint exam has been documented for this visit   There is currently no information documented on the homunculus. Go to the Rheumatology activity and complete the homunculus joint exam.  Investigation: No additional findings.  Imaging: No results found.  Recent Labs: Lab Results  Component Value Date   WBC 20.0 (H) 08/28/2022   HGB 15.0 09/12/2022   HGB 15.3 09/12/2022   PLT 324 08/28/2022   NA 140 09/12/2022   NA 140 09/12/2022   K 3.7 09/12/2022   K 3.7 09/12/2022   CL 103 08/28/2022   CO2 30 08/28/2022   GLUCOSE 88 08/28/2022   BUN 15 08/28/2022   CREATININE 0.82 08/28/2022   BILITOT 0.4 08/28/2022   ALKPHOS 52 08/28/2022   AST 19 08/28/2022   ALT 11 08/28/2022   PROT 7.4 08/28/2022   ALBUMIN 4.0 08/28/2022   CALCIUM 9.5 08/28/2022   GFRAA >60 06/06/2016   QFTBGOLDPLUS INDETERMINATE (A) 06/20/2022    Speciality Comments: PPD Negative 07/20/2022  Procedures:  No procedures performed Allergies: Patient has no known allergies.      Assessment / Plan:     Visit Diagnoses: Idiopathic inflammatory myopathy - Jo-1 Ab +, Ro Ab + - elevated CK, aldolase, +ANA, +RF, positive anti-CCP, severe muscle weakness with dysphagia, dysphonia and dysarthria, mechanics hands:   He is currently taking CellCept 500 mg 3 tablets twice daily, Bactrim 3 days weekly, prednisone 30 mg daily, and Plaquenil 200 mg 1 tablet by mouth twice daily.  Plaquenil was added in early December 2023.  He has been tolerating combination therapy without any side effects. He has not missed any doses recently.  Patient continues to have fatigue and muscle soreness.  He states that his muscle strength is not as good as it was few months ago.  The prednisone dose was increased in November back to 30 mg daily after an increase in his CK levels (CK 956 on 11/30 up from 265 on 10/24).  CK elevated-826 on 09/24/22. We will recheck CK today.  Paperwork from handicap placard provided to the patient today.   Dr.  Corliss Skains evaluated the patient today as well and provided the following recommendations:  Patient has an upcoming appointment with [facility] on 10/29/22.  Dr. Corliss Skains had a detailed discussion with the patient that she would like for [facility] rheumatology to  take over his care as he may need more aggressive therapy.  Patient was in agreement.  We will provide supportive care here.  He will stay on the current dose of prednisone 30 mg daily until we have lab results available.- Plan: CK  High risk medication use - Plaquenil 200 mg 1 tablet by mouth BID, Cellcept 500 mg 3 tablets by mouth twice daily, Bactrim 3 days weekly, and prednisone 30 mg by mouth daily. Orders for CBC and CMP released today.  - Plan: COMPLETE METABOLIC PANEL WITH GFR, CBC with Differential/Platelet No plaquenil eye examination on file.  Patient is scheduled for a baseline plaquenil eye exam on 11/01/22.  He has a plaquenil eye examination form to take with him to his upcoming appointment.  No recent or recurrent infections.    Long term (current) use of systemic steroids -Patient remains on prednisone 30 mg daily.  Dietary modifications discussed.  He understands the side effects of prednisone include weight gain, heart disease, osteoporosis, diabetes and cataracts.  TPMT intermediate metabolizer (HCC) - TPMT was 6, low metabolizer.   Interstitial lung disease (HCC) - Followed by Dr. Marchelle Gearing.  High-resolution chest CT on 07/04/22.  Scheduled for upcoming PFTs and office visit with Dr. Marchelle Gearing on 11/06/22.  He continues to experience SOB on exertion.  Dr. Marchelle Gearing mentioned about possible benefit from adding on IV Rituximab.  He will have discussion at [facility] rheumatology and also with Dr. Marchelle Gearing at the follow-up visit.  Other dysphagia - Referred to Dr. Myrtie Neither.  Patient underwent a barium swallow study on 08/14/2022-negative modified barium swallow-per Dr. Chales Abrahams on 08/26/22.  He is not experiencing any symptoms of dysphagia  at this time.  He was advised to notify us if he develops any new or worsening symptoms.    Chronic pain of both shoulders - Dr. August Saucer.  Right rotator cuff tear-limited abduction of both shoulders, especially the right shoulder.  Patient is holding off on proceeding with surgical intervention at this time. Upcoming appointment with Dr. August Saucer scheduled on 12/07/22.   Pain in both hands: No synovitis noted.    Raynaud's syndrome without gangrene: He has been experiencing an increased frequency of raynaud's symptoms in his fingertips and toes with the cooler weather temperatures.  Discussed the importance of avoiding triggers.  Conservative treatment options were discussed.  Mild cyanosis of right 5th fingertip noted.  Good capillary refill noted <2 seconds.    Other medical conditions are listed as follows:   Steroid-induced osteoporosis - DEXA order remains in place.  History of DVT (deep vein thrombosis) - Anticardiolipin antibody and beta-2 GP 1 were negative.  He has positive ANA and positive Ro antibody.  He is on Eliquis.  Hx of pulmonary embolus: Patient remains on eliquis as prescribed.   Pericardial effusion  Leukocytosis/thrombocytosis - Longstanding history of leukocytosis and thrombocytosis.  Seizure disorder (HCC)  Lower extremity edema  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 fascicular block  History of non-ST elevation myocardial infarction (NSTEMI)  Orders: Orders Placed This Encounter  Procedures   COMPLETE METABOLIC PANEL WITH GFR   CBC with Differential/Platelet   CK   No orders of the defined types were placed in this encounter.    Follow-Up Instructions: Return in about 6 weeks (around 11/29/2022) for Idiopathic inflammatory myopathy.   Gearldine Bienenstock, PA-C  Note - This record has been created using Dragon software.  Chart creation errors have been sought, but may not always  have been located. Such creation errors  do not reflect on  the standard of  medical care.

## 2022-10-15 ENCOUNTER — Telehealth: Payer: Self-pay

## 2022-10-15 NOTE — Telephone Encounter (Signed)
-----   Message from Meredith Pel, MD sent at 10/13/2022  8:08 PM EST ----- Hi Jancie Kercher can you make him an appointment for 2 months.  Also check with [name] please to find out if he was actually posted for surgery.  I do not think that he was because his prednisone dosing remains high.

## 2022-10-15 NOTE — Telephone Encounter (Signed)
Cameron Kim please schedule 2 month follow up  [name] please let us know about whether or not patient posted.

## 2022-10-17 NOTE — Telephone Encounter (Signed)
Please just make him an appointment for 2 months from now.  Thanks he is on too much prednisone at this time I think to really have surgery but I think he does want at some point

## 2022-10-18 ENCOUNTER — Encounter: Payer: Self-pay | Admitting: Physician Assistant

## 2022-10-18 ENCOUNTER — Ambulatory Visit: Payer: BC Managed Care – PPO | Attending: Physician Assistant | Admitting: Physician Assistant

## 2022-10-18 VITALS — BP 138/88 | HR 106 | Resp 18 | Ht 72.0 in | Wt 259.0 lb

## 2022-10-18 DIAGNOSIS — M25511 Pain in right shoulder: Secondary | ICD-10-CM

## 2022-10-18 DIAGNOSIS — E8889 Other specified metabolic disorders: Secondary | ICD-10-CM

## 2022-10-18 DIAGNOSIS — I252 Old myocardial infarction: Secondary | ICD-10-CM

## 2022-10-18 DIAGNOSIS — J849 Interstitial pulmonary disease, unspecified: Secondary | ICD-10-CM

## 2022-10-18 DIAGNOSIS — Z86711 Personal history of pulmonary embolism: Secondary | ICD-10-CM

## 2022-10-18 DIAGNOSIS — Z86718 Personal history of other venous thrombosis and embolism: Secondary | ICD-10-CM

## 2022-10-18 DIAGNOSIS — I7300 Raynaud's syndrome without gangrene: Secondary | ICD-10-CM

## 2022-10-18 DIAGNOSIS — I452 Bifascicular block: Secondary | ICD-10-CM

## 2022-10-18 DIAGNOSIS — Z79899 Other long term (current) drug therapy: Secondary | ICD-10-CM

## 2022-10-18 DIAGNOSIS — M25512 Pain in left shoulder: Secondary | ICD-10-CM

## 2022-10-18 DIAGNOSIS — M79641 Pain in right hand: Secondary | ICD-10-CM

## 2022-10-18 DIAGNOSIS — M79642 Pain in left hand: Secondary | ICD-10-CM

## 2022-10-18 DIAGNOSIS — R600 Localized edema: Secondary | ICD-10-CM

## 2022-10-18 DIAGNOSIS — Z7952 Long term (current) use of systemic steroids: Secondary | ICD-10-CM

## 2022-10-18 DIAGNOSIS — I3139 Other pericardial effusion (noninflammatory): Secondary | ICD-10-CM

## 2022-10-18 DIAGNOSIS — G8929 Other chronic pain: Secondary | ICD-10-CM

## 2022-10-18 DIAGNOSIS — R1319 Other dysphagia: Secondary | ICD-10-CM

## 2022-10-18 DIAGNOSIS — G7249 Other inflammatory and immune myopathies, not elsewhere classified: Secondary | ICD-10-CM | POA: Diagnosis not present

## 2022-10-18 DIAGNOSIS — T380X5A Adverse effect of glucocorticoids and synthetic analogues, initial encounter: Secondary | ICD-10-CM

## 2022-10-18 DIAGNOSIS — D72828 Other elevated white blood cell count: Secondary | ICD-10-CM

## 2022-10-18 DIAGNOSIS — M818 Other osteoporosis without current pathological fracture: Secondary | ICD-10-CM

## 2022-10-18 DIAGNOSIS — G40909 Epilepsy, unspecified, not intractable, without status epilepticus: Secondary | ICD-10-CM

## 2022-10-18 NOTE — Telephone Encounter (Signed)
Pt is scheduled for march

## 2022-10-19 ENCOUNTER — Other Ambulatory Visit: Payer: Self-pay | Admitting: *Deleted

## 2022-10-19 LAB — CBC WITH DIFFERENTIAL/PLATELET
Absolute Monocytes: 776 cells/uL (ref 200–950)
Basophils Absolute: 100 cells/uL (ref 0–200)
Basophils Relative: 0.5 %
Eosinophils Absolute: 199 cells/uL (ref 15–500)
Eosinophils Relative: 1 %
HCT: 45.5 % (ref 38.5–50.0)
Hemoglobin: 15.2 g/dL (ref 13.2–17.1)
Lymphs Abs: 1413 cells/uL (ref 850–3900)
MCH: 30.8 pg (ref 27.0–33.0)
MCHC: 33.4 g/dL (ref 32.0–36.0)
MCV: 92.1 fL (ref 80.0–100.0)
MPV: 9.9 fL (ref 7.5–12.5)
Monocytes Relative: 3.9 %
Neutro Abs: 17413 cells/uL — ABNORMAL HIGH (ref 1500–7800)
Neutrophils Relative %: 87.5 %
Platelets: 356 10*3/uL (ref 140–400)
RBC: 4.94 10*6/uL (ref 4.20–5.80)
RDW: 12.3 % (ref 11.0–15.0)
Total Lymphocyte: 7.1 %
WBC: 19.9 10*3/uL — ABNORMAL HIGH (ref 3.8–10.8)

## 2022-10-19 LAB — COMPLETE METABOLIC PANEL WITH GFR
AG Ratio: 1.4 (calc) (ref 1.0–2.5)
ALT: 14 U/L (ref 9–46)
AST: 33 U/L (ref 10–35)
Albumin: 4 g/dL (ref 3.6–5.1)
Alkaline phosphatase (APISO): 48 U/L (ref 35–144)
BUN: 20 mg/dL (ref 7–25)
CO2: 29 mmol/L (ref 20–32)
Calcium: 9.1 mg/dL (ref 8.6–10.3)
Chloride: 102 mmol/L (ref 98–110)
Creat: 0.96 mg/dL (ref 0.70–1.30)
Globulin: 2.9 g/dL (calc) (ref 1.9–3.7)
Glucose, Bld: 89 mg/dL (ref 65–99)
Potassium: 4.6 mmol/L (ref 3.5–5.3)
Sodium: 140 mmol/L (ref 135–146)
Total Bilirubin: 0.7 mg/dL (ref 0.2–1.2)
Total Protein: 6.9 g/dL (ref 6.1–8.1)
eGFR: 96 mL/min/{1.73_m2} (ref >=60)

## 2022-10-19 LAB — CK: Total CK: 1196 U/L — ABNORMAL HIGH (ref 44–196)

## 2022-10-19 MED ORDER — PREDNISONE 20 MG PO TABS: 40.0000 mg | ORAL_TABLET | Freq: Every day | ORAL | 0 refills | Status: DC

## 2022-10-19 NOTE — Progress Notes (Signed)
WBC is elevated-19.9.  absolute neutrophils are elevated.  Rest of CBC WNL.  CMP WNL.  CK is elevated-1,196.  Please advise patient to increase to prednisone 40 mg daily.  Please change prescription for prednisone and clarify if he needs a refill.

## 2022-10-29 DIAGNOSIS — M0579 Rheumatoid arthritis with rheumatoid factor of multiple sites without organ or systems involvement: Secondary | ICD-10-CM | POA: Diagnosis not present

## 2022-10-29 DIAGNOSIS — Z79899 Other long term (current) drug therapy: Secondary | ICD-10-CM | POA: Diagnosis not present

## 2022-10-29 DIAGNOSIS — M3320 Polymyositis, organ involvement unspecified: Secondary | ICD-10-CM | POA: Diagnosis not present

## 2022-10-30 DIAGNOSIS — I129 Hypertensive chronic kidney disease with stage 1 through stage 4 chronic kidney disease, or unspecified chronic kidney disease: Secondary | ICD-10-CM | POA: Diagnosis not present

## 2022-10-30 DIAGNOSIS — G7249 Other inflammatory and immune myopathies, not elsewhere classified: Secondary | ICD-10-CM | POA: Diagnosis not present

## 2022-10-30 DIAGNOSIS — N189 Chronic kidney disease, unspecified: Secondary | ICD-10-CM | POA: Diagnosis not present

## 2022-10-30 DIAGNOSIS — R809 Proteinuria, unspecified: Secondary | ICD-10-CM | POA: Diagnosis not present

## 2022-10-31 LAB — LAB REPORT - SCANNED
Creatinine, POC: 285.3 mg/dL
Protein/Creatinine Ratio: 127

## 2022-11-03 ENCOUNTER — Other Ambulatory Visit: Payer: Self-pay | Admitting: Physician Assistant

## 2022-11-05 NOTE — Telephone Encounter (Signed)
Next Visit: patient is scheduled to follow up with [facility] on 03/11/2023.  Last Visit: 10/18/2022  Last Fill: 08/01/2022  DX:  Idiopathic inflammatory myopathy    Current Dose per office note on 10/18/2022: Bactrim 3 days weekly    Okay to refill bactrim DS?

## 2022-11-06 ENCOUNTER — Ambulatory Visit (INDEPENDENT_AMBULATORY_CARE_PROVIDER_SITE_OTHER): Payer: BC Managed Care – PPO | Admitting: Internal Medicine

## 2022-11-06 ENCOUNTER — Encounter: Payer: Self-pay | Admitting: Internal Medicine

## 2022-11-06 ENCOUNTER — Telehealth: Payer: Self-pay | Admitting: Internal Medicine

## 2022-11-06 VITALS — BP 118/80 | HR 86 | Temp 98.8°F | Ht 73.0 in | Wt 265.0 lb

## 2022-11-06 DIAGNOSIS — J8489 Other specified interstitial pulmonary diseases: Secondary | ICD-10-CM

## 2022-11-06 DIAGNOSIS — M359 Systemic involvement of connective tissue, unspecified: Secondary | ICD-10-CM

## 2022-11-06 DIAGNOSIS — R768 Other specified abnormal immunological findings in serum: Secondary | ICD-10-CM

## 2022-11-06 DIAGNOSIS — M3391 Dermatopolymyositis, unspecified with respiratory involvement: Secondary | ICD-10-CM | POA: Diagnosis not present

## 2022-11-06 DIAGNOSIS — R748 Abnormal levels of other serum enzymes: Secondary | ICD-10-CM

## 2022-11-06 DIAGNOSIS — M3320 Polymyositis, organ involvement unspecified: Secondary | ICD-10-CM

## 2022-11-06 LAB — PULMONARY FUNCTION TEST
DL/VA % pred: 79 %
DL/VA: 3.48 ml/min/mmHg/L
DLCO cor % pred: 50 %
DLCO cor: 15.49 ml/min/mmHg
DLCO unc % pred: 51 %
DLCO unc: 15.74 ml/min/mmHg
FEF 25-75 Pre: 2.8 L/sec
FEF2575-%Pred-Pre: 78 %
FEV1-%Pred-Pre: 60 %
FEV1-Pre: 2.51 L
FEV1FVC-%Pred-Pre: 107 %
FEV6-%Pred-Pre: 58 %
FEV6-Pre: 3 L
FEV6FVC-%Pred-Pre: 102 %
FVC-%Pred-Pre: 56 %
FVC-Pre: 3.03 L
Pre FEV1/FVC ratio: 83 %
Pre FEV6/FVC Ratio: 99 %

## 2022-11-06 LAB — CK: Total CK: 1005 U/L — ABNORMAL HIGH (ref 7–232)

## 2022-11-06 NOTE — Addendum Note (Signed)
Addended by: Gavin Potters R on: 11/06/2022 01:42 PM   Modules accepted: Orders

## 2022-11-06 NOTE — Telephone Encounter (Addendum)
Let Cameron Kim Iran know that after he left the rheumatologist at [facility] got back to me and the plan is for IVIG at [facility].  And if this did not work then we would consider my recommendation of Rituxan. His CK today still high at 1000

## 2022-11-06 NOTE — Progress Notes (Addendum)
OV 06/26/2022  Subjective:  Patient ID: Cameron Kim, male , DOB: 1971/05/08 , age 52 y.o. , MRN: [id number] , ADDRESS: [address] [place] [address] PCP Cameron Hatch, MD Patient Care Team: Cameron Hatch, MD as PCP - General (Family Medicine) Cameron Gess, MD as PCP - Cardiology (Cardiology)  This Provider for this visit: Treatment Team:  Attending Provider: Kalman Shan, MD    06/26/2022 -   Chief Complaint  Patient presents with   Consult    Wheezing/ cough      HPI Cameron Kim 52 y.o. -referral from the rheumatology Dr. Corliss Kim  who he saw for the first time on 05/31/2022.  He has been diagnosed with elevated CK and Jo 1 positive polymyositis.  He used to work in [employer].  He has had COVID booster mRNA 3 times.  He had COVID in November 2022 and sometime after that started developing symptoms of weakness of the proximal muscles.  This also included dysphagia, dysphonia and dysarthria after he saw rheumatology on 05/31/2022 he was placed on prednisone 40 mg twice daily.  After this is significantly improved.  His results were elevated CK which is improving.  Jo 1 antibody positive positive ANA.  QuantiFERON gold indeterminate.  He has not been referred to [facility] where he has an upcoming appointment.  In the last few to several months he is also noticing cough with some wheezing.  He is also choking on saliva.  There is no shortness of breath per se but then he is weak and is not able to walk too much.  He is not disabled from his work.  He did have a CT scan of the abdomen 06/08/2022 in the lung images do show abnormalities.  However walking desaturation test today with this is normal.  He does not have echocardiogram.  He does not have pulmonary function test.  There is no high-resolution CT chest.    [facility] consult 9/122/23 Dr Cameron Kim      No data to display         Chief Complaint  Patient presents with   Polymyositis   Cameron Kim is a 52 y.o. male who presents for evaluation of longstanding anti-Jo-1 antibody positive inflammatory myositis. The patient is accompanied by his wife, Cameron Kim.  Beginning in 08/2021, the patient noticed decreased strength which worsened to the point he was unable to move or sit up in bed. He saw his primary care provider in 02/2022 and has seen multiple physicians since. He recently saw a rheumatologist locally who diagnosed him with polymyositis and referred him here to [facility]. Cameron Kim also indicates he has difficulty swallowing and his voice has changed. He notes a weight loss of 48 pounds in 9 months. He also mentions he had COVID-19 around the same time. The patient endorses a rash which he feels looks like ringworm with extreme itchiness and flakiness.   The patient has a history of a torn rotator cuff but was denied surgery due to a protein abnormality. Once he was provided steroids on 06/01/2022, the patient improved overall. He continues to have stiffness which impacts his lifestyle.   The patient has had numerous tests including DEXA scan, upper abdominal exam, and is scheduled for a biopsy next week. He has noticed increased moodiness and appetite with prednisone. He also indicates he has insomnia. His prednisone taper was started at 80 mg and currently he continues on 60 mg daily.  The  only joint pain Cameron Kim notes currently is hand pain with inability to fully close his hands. Prior to prednisone, he had all over joint pain.   Family History: Father diagnosed with ALS. Social History: He worked in [employer]. Past medical history:  - Blood clots in 2013 and 2022, currently on blood thinners.    A/P  Cameron Kim is a 52 y.o. male with likely an overlap disease with anti-Jo-1 antibody, Ro antibodies, rheumatoid factor, and CCP positivity.   He has signs and symptoms classic for dermatomyositis with history of heliotropic rash, mechanics hands,  Gottron's papules, proximal muscle weakness, and inflammatory arthritis, currently on prednisone 60 mg. He is planning to get tapered with his local rheumatologist down to 50 mg in the next few weeks.   He is scheduled for a muscle biopsy on Monday, and he will plan to start CellCept on Tuesday after he undergoes the muscle biopsy. We discussed the risks and benefits of CellCept, azathioprine, methotrexate, IVIG and for now the goal will be to start the CellCept and titrate it up slowly until he can tolerate 1500 mg twice daily.  1. Jo-1 positive polymyositis (CMS-HCC) 2. High risk medication use 3. Seropositive RA - mycophenolate (CELLCEPT) 500 mg tablet; Take 1 tablet (500 mg total) by mouth 2 (two) times daily for 14 days, THEN 2 tablets (1,000 mg total) 2 (two) times daily for 14 days, THEN 3 tablets (1,500 mg total) 2 (two) times daily for 90 days. - continue prednisone 60 mg for now, follow taper as suggested by local rheumatologist   OV 08/28/2022  Subjective:  Patient ID: Cameron Kim, male , DOB: [birth year] , age 52 y.o. , MRN: [id number] , ADDRESS: [address] [place] [address] PCP Midge Minium, MD Patient Care Team: Midge Minium, MD as PCP - General (Family Medicine) Lorretta Harp, MD as PCP - Cardiology (Cardiology) Bo Merino, MD as Consulting Physician (Rheumatology)  This Provider for this visit: Treatment Team:  Attending Provider: Brand Males, MD    08/28/2022 -   Chief Complaint  Patient presents with   Follow-up    Pt states he has been doing okay since last visit.     HPI Cameron Kim 52 y.o. -returns for follow-up.  Since last seeing me he has seen Dr. Carla Drape at [facility]ology.  Her notes were reviewed and copied and pasted above.  She is diagnosed with combination of dermatomyositis/polymyositis with seropositive rheumatoid arthritis.  She started him on CellCept along with Bactrim.  High-dose  prednisone which is being tapered now by local rheumatologist Dr Solon Augusta.  He is beginning to feel better.  His muscle strength is improved.  He did have interstitial lung disease workup.  He had an echocardiogram that shows enlarged pulmonary arteries with suggestion of pulmonary hypertension.  He also had high-resolution d discuss the implications of the findings.  He recollects a left heart catheterization a few years ago at [facility] when he was incarcerated.  But it does not sound like he has had a right heart catheterization.   His current symptoms are as below   No results found for: "NITRICOXIDE"  Latest Reference Range & Units 05/31/22 11:02 06/19/22 16:30 07/31/22 08:40  CK Total 44 - 196 U/L 3,806 (H) 475 (H) 265 (H)     OV 11/06/2022  Subjective:  Patient ID: Cameron Kim, male , DOB: 1970/10/20 , age 65 y.o. , MRN: [id number] , ADDRESS: [place] [id number] PCP Midge Minium, MD Patient Care Team: Midge Minium, MD as PCP - General (Family Medicine) Lorretta Harp, MD as PCP - Cardiology (Cardiology) Bo Merino, MD as Consulting Physician (Rheumatology)  This Provider for this visit: Treatment Team:  Attending Provider: Brand Males, MD    11/06/2022 -   Chief Complaint  Patient presents with   Follow-up    PFT done today. Breathing is unchanged since the last visit.      HPI Cameron Kim 52 y.o. -he is here for follow-up.  After the last visit I emailed [facility] rheumatology Dr. Carla Drape and she emailed thought that 2 doses of Rituxan could be potential benefit for him in stabilizing his ILD.  He did see the other local [facility] rheumatology PA on 10/18/2022.  Was noted that he was taking Plaquenil, CellCept 100 mg 3 tablets by mouth twice daily along with Bactrim and prednisone 30 mg.  It was noted that he did not have any side effects..  He reported some arthralgia.  And also continued intermittent  rashes as well as increased frequency of Raynaud's.  However CK was elevated to 1100 [personally reviewed] [facility] rheumatology preferred that [facility] rheumatology take over his care.  On his visit to [facility] Dr. Carla Drape on 10/29/2022 he noticed that his skin was worsening and experiencing dryness.  There was some suspicion of a hand infection and he did get antibiotic doxycycline prior to that visit.  Rheumatology at [facility] felt that his disease was not in remission.  She gave him handout on methotrexate.  Low-dose of amlodipine for the worsening Raynaud's was prescribed.  She also decided to start IVIG because of elevations in CK despite CellCept and Plaquenil and high-dose prednisone.  She personally emailed me today saying that current plan is for him to get IVIG at [facility]  1 mg/kg x2 days at ideal weight, which made the dose 80 mg, x2.  In the work she would consider IV Rituxan.    Today he tells me that he is now taking prednisone 40 mg/day since approximately 10/14/2022 since his CK was elevated.  Also for the last week or so since seeing [facility] rheumatology he is on amlodipine.  So far he is not sure things have improved particularly in the foot over the Raynaud's.  He is open to getting CK checked  Of note he did have right heart catheterization by Dr. Loralie Champagne in December 2023.  He does have pulmonary artery pressures bordeline elevated but his pulmonary vascular resistance is less than 3. PCWP 12, MAP 25 and PVR < 2  From a respiratory standpoint he is stable and walking desaturation test is stable and pulmonary function test is stable records suggesting that his ILD is actually still stable.  Although given the elevated CK he is at risk of progression in the future.   SYMPTOM SCALE - ILD 08/28/2022 11/06/2022   Current weight  Cellcept , pred, plaquenil, bactrim  O2 use ra   Shortness of Breath 0 -> 5 scale with 5 being worst (score 6 If unable to do)   At rest  0 0  Simple tasks - showers, clothes change, eating, shaving 2 0  Household (dishes, doing bed, laundry) 2 2  Shopping 2 0  Walking level at own pace 2 1  Walking up Stairs 3 1  Total (30-36) Dyspnea Score 11 4  How bad is your cough? 0 0  How  bad is your fatigue 3 1  How bad is nausea 2 0  How bad is vomiting?  0 0  How bad is diarrhea? 3 0  How bad is anxiety? 2 1  How bad is depression 0 0  Any chronic pain - if so where and how bad x        Simple office walk 185 feet x  3 laps goal with forehead probe 06/26/2022  11/06/2022   O2 used ra   Number laps completed 3   Comments about pace x   Resting Pulse Ox/HR 99% and 85/min 100%  Final Pulse Ox/HR 97% and 105/min 96%  Desaturated </= 88% no   Desaturated <= 3% points no 4 points  Got Tachycardic >/= 90/min yes   Symptoms at end of test x   Miscellaneous comments x     Modified Six Minute Walk - 11/06/22 1300     Type of O2 used  Room Air    Number of laps completed  3    Lap Pace Brisk    Resting Heartrate 88 bpm    Final Heartrate 110 bpm    Resting Pulse Ox 100 %    Desaturated to <= 3 points Yes    Desaturated to < 88% No    Became tachycardic Yes    Symptoms  min SOB    Was the O2 correction test done? No              PFT     Latest Ref Rng & Units 11/06/2022   12:07 PM 08/10/2022    8:52 AM  PFT Results  FVC-Pre L 3.03  P 2.89   FVC-Predicted Pre % 56  P 53   FVC-Post L  2.95   FVC-Predicted Post %  54   Pre FEV1/FVC % % 83  P 84   Post FEV1/FCV % %  84   FEV1-Pre L 2.51  P 2.43   FEV1-Predicted Pre % 60  P 58   FEV1-Post L  2.49   DLCO uncorrected ml/min/mmHg 15.74  P 14.99   DLCO UNC% % 51  P 48   DLCO corrected ml/min/mmHg 15.49  P 14.87   DLCO COR %Predicted % 50  P 47   DLVA Predicted % 79  P 86   TLC L  4.20   TLC % Predicted %  56   RV % Predicted %  64     P Preliminary result      Latest Reference Range & Units 05/31/22 11:02 06/19/22 16:30 07/31/22 08:40 09/06/22 11:36  09/24/22 09:17 10/18/22 09:04  CK Total 44 - 196 U/L 3,806 (H) 475 (H) 265 (H) 956 (H) 826 (H) 1,196 (H)  (H): Data is abnormally high    has a past medical history of Abnormal EKG, Ankle fracture, left, DVT (deep venous thrombosis) (HCC), Dysrhythmia, Idiopathic inflammatory myopathy, Peripheral vascular disease (HCC) (2013), Polymyositis (HCC), Pulmonary embolism (HCC) (05/2012), and Seizure disorder (HCC) (11/07/2020).   reports that he has never smoked. He has never been exposed to tobacco smoke. He has never used smokeless tobacco.  Past Surgical History:  Procedure Laterality Date   CARDIAC CATHETERIZATION  2021   COLONOSCOPY     I & D EXTREMITY Right 06/07/2016   Procedure: IRRIGATION AND DEBRIDEMENT RIGHT WRIST,MEDIAN NERVE EXPLORATION NEUROLYSIS AND REPAIR;  Surgeon: Dominica Severin, MD;  Location: [facility] OR;  Service: Orthopedics;  Laterality: Right;   medial nerve surgry right arm  MUSCLE BIOPSY Right 07/02/2022   Procedure: RIGHT THIGH MUSCLE BIOPSY;  Surgeon: Griselda Miner, MD;  Location: [facility] OR;  Service: General;  Laterality: Right;   RIGHT HEART CATH N/A 09/12/2022   Procedure: RIGHT HEART CATH;  Surgeon: Laurey Morale, MD;  Location: [facility] INVASIVE CV LAB;  Service: Cardiovascular;  Laterality: N/A;    No Known Allergies  Immunization History  Administered Date(s) Administered   Influenza,inj,Quad PF,6+ Mos 09/01/2018   Moderna SARS-COV2 Booster Vaccination 11/05/2020   PPD Test 07/18/2022   Tdap 10/08/2004, 12/03/2014    Family History  Problem Relation Age of Onset   Diabetes Mother    Hypertension Mother    Heart murmur Mother    Stroke Mother    Diabetes Father    Hypertension Father    ALS Father    Stroke Sister    Rheum arthritis Brother    Diabetes Paternal Grandmother    Diabetes Paternal Grandfather    Colon cancer Neg Hx    Esophageal cancer Neg Hx    Rectal cancer Neg Hx    Stomach cancer Neg Hx      Current Outpatient Medications:     amLODipine (NORVASC) 5 MG tablet, Take 5 mg by mouth daily., Disp: , Rfl:    apixaban (ELIQUIS) 5 MG TABS tablet, Take 1 tablet (5 mg total) by mouth 2 (two) times daily., Disp: 60 tablet, Rfl: 3   Cyanocobalamin (VITAMIN B-12 PO), Take 1 tablet by mouth daily., Disp: , Rfl:    FOLIC ACID PO, Take 1 tablet by mouth daily., Disp: , Rfl:    hydroxychloroquine (PLAQUENIL) 200 MG tablet, Take 1 tablet (200 mg total) by mouth 2 (two) times daily., Disp: 60 tablet, Rfl: 2   mycophenolate (CELLCEPT) 500 MG tablet, Take 3 tablets (1,500 mg total) by mouth 2 (two) times daily., Disp: 180 tablet, Rfl: 2   predniSONE (DELTASONE) 10 MG tablet, Take 1 tablet by mouth daily. (Patient taking differently: Take 10 mg by mouth daily.), Disp: 30 tablet, Rfl: 0   sildenafil (VIAGRA) 100 MG tablet, Take 0.5-1 tablets (50-100 mg total) by mouth daily as needed for erectile dysfunction., Disp: 5 tablet, Rfl: 11   sulfamethoxazole-trimethoprim (BACTRIM DS) 800-160 MG tablet, TAKE 1 TABLET BY MOUTH 3 TIMES A WEEK, Disp: 36 tablet, Rfl: 0      Objective:   Vitals:   11/06/22 1305  BP: 118/80  Pulse: 86  Temp: 98.8 F (37.1 C)  TempSrc: Oral  SpO2: 100%  Weight: 265 lb (120.2 kg)  Height: 6\' 1"  (1.854 m)    Estimated body mass index is 34.96 kg/m as calculated from the following:   Height as of this encounter: 6\' 1"  (1.854 m).   Weight as of this encounter: 265 lb (120.2 kg).  @WEIGHTCHANGE @    11/06/22 1305  Weight: 265 lb (120.2 kg)     Physical Exam    General: No distress. [place] well Neuro: Alert and Oriented x 3. GCS 15. Speech normal Psych: Pleasant Resp:  Barrel Chest - no.  Wheeze - no, Crackles - no, No overt respiratory distress CVS: Normal heart sounds. Murmurs - no Ext: Stigmata of Connective Tissue Disease - no HEENT: Normal upper airway. PEERL +. No post nasal drip        Assessment:       ICD-10-CM   1. Interstitial lung disease due to connective tissue  disease (HCC)  J84.89    M35.9     2. Dermatomyositis  affecting respiratory system ([place])  M33.91     3. Jo-1 positive polymyositis (HCC)  M33.20     4. Cyclic citrullinated peptide (CCP) antibody positive  R76.8     5. Elevated CK  R74.8          Plan:     Patient Instructions     ICD-10-CM   1. Interstitial lung disease due to connective tissue disease ([place])  J84.89    M35.9     2. Dermatomyositis affecting respiratory system ([place])  M33.91     3. Jo-1 positive polymyositis (HCC)  M33.20     4. Cyclic citrullinated peptide (CCP) antibody positive  R76.8     5. Elevated CK  R74.8       -Pulmonary fibrosis itself appears to be stable.   -Pulmonary hypertension was very mild/borderline; do not qualify for pulmonary hypertension treatment at this point - However it appears dermatomyositis/polymyositis is still active   Plan - I will connect with Dr. Carla Drape at [facility] again  -Will check if she wants to do IVIG versus IV Rituxan  -Will check if she is okay doing the infusions at [facility] -Currently we will hold off on any antifibrotic until and unless we document any progression -Repeat spirometry and DLCO in 3-4 months -Continue current medications through rheumatology  Follow-up - Return to see Dr. Chase Caller for a 30-minute visit in 3-4 months but after spirometry and DLCO  -Symptoms & Walk test at follow-up   ( Level 05 visit: Estb 40-54 min  in  visit type: on-site physical face to visit  in total care time and counseling or/and coordination of care by this undersigned MD - Dr Brand Males. This includes one or more of the following on this same day 11/06/2022: pre-charting, chart review, note writing, documentation discussion of test results, diagnostic or treatment recommendations, prognosis, risks and benefits of management options, instructions, education, compliance or risk-factor reduction. It excludes time spent by the [place] or office staff in the care  of the patient. Actual time 40 min)    SIGNATURE    Dr. Brand Males, M.D., F.C.C.P,  Pulmonary and Critical Care Medicine Staff Physician, [place] Director - Interstitial Lung Disease  Program  Pulmonary [facility] [place], [place], [address]  Pager: [phone], If no answer or between  15:00h - 7:00h: call [phone] Telephone: [phone]  1:33 PM 11/06/2022

## 2022-11-06 NOTE — Patient Instructions (Signed)
Spirometry and DLCO Performed Today.  

## 2022-11-06 NOTE — Progress Notes (Signed)
Mar has canceled all follow-up appointment with [place].  Please notify [facility] rheumatology that patient will be following with them only and his care will be transferred to them.  Please notify patient that his care has been transferred to [facility] rheumatology.

## 2022-11-06 NOTE — Progress Notes (Signed)
Spirometry and DLCO Performed Today.  

## 2022-11-06 NOTE — Addendum Note (Signed)
Addended by: Brand Males on: 11/06/2022 02:12 PM   Modules accepted: Level of Service

## 2022-11-06 NOTE — Patient Instructions (Addendum)
ICD-10-CM   1. Interstitial lung disease due to connective tissue disease ([place])  J84.89    M35.9     2. Dermatomyositis affecting respiratory system ([place])  M33.91     3. Jo-1 positive polymyositis (HCC)  M33.20     4. Cyclic citrullinated peptide (CCP) antibody positive  R76.8     5. Elevated CK  R74.8       -Pulmonary fibrosis itself appears to be stable.   -Pulmonary hypertension was very mild/borderline; do not qualify for pulmonary hypertension treatment at this point - However it appears dermatomyositis/polymyositis is still active   Plan - I will connect with Dr. Carla Drape at [facility] again  -Will check if she wants to do IVIG versus IV Rituxan  -Will check if she is okay doing the infusions at [facility] -Currently we will hold off on any antifibrotic until and unless we document any progression -Repeat spirometry and DLCO in 3-4 months -Continue current medications through rheumatology - check CK 11/06/2022   Follow-up - Return to see Dr. Chase Caller for a 30-minute visit in 3-4 months but after spirometry and DLCO  -Symptoms & Walk test at follow-up

## 2022-11-08 NOTE — Telephone Encounter (Signed)
Called and spoke with pt letting him know info per MR and he verbalized understanding. Nothing further needed. ?

## 2022-11-12 ENCOUNTER — Other Ambulatory Visit: Payer: Self-pay | Admitting: Physician Assistant

## 2022-11-12 ENCOUNTER — Other Ambulatory Visit: Payer: Self-pay | Admitting: Internal Medicine

## 2022-11-15 ENCOUNTER — Ambulatory Visit (INDEPENDENT_AMBULATORY_CARE_PROVIDER_SITE_OTHER): Payer: BC Managed Care – PPO | Admitting: Family Medicine

## 2022-11-15 ENCOUNTER — Encounter: Payer: Self-pay | Admitting: Family Medicine

## 2022-11-15 VITALS — BP 134/78 | HR 84 | Temp 97.8°F | Ht 73.0 in | Wt 272.0 lb

## 2022-11-15 DIAGNOSIS — S90821A Blister (nonthermal), right foot, initial encounter: Secondary | ICD-10-CM | POA: Diagnosis not present

## 2022-11-15 MED ORDER — NYSTATIN 100000 UNIT/GM EX POWD: 1.0000 | Freq: Two times a day (BID) | CUTANEOUS | 3 refills | Status: AC

## 2022-11-15 NOTE — Progress Notes (Signed)
   Subjective:    Patient ID: Crews Iran, male    DOB: [birth year], 52 y.o.   MRN: [id number]  HPI R foot blister- pt reports that his R foot 'keeps peeling'.  Pt reports foot is sensitive over the areas that have peeled.  Is currently on Bactrim DS TID.  Also on Prednisone 40mg  daily.  Pt states this started in late December.  Pt states his feet get really sweaty   Review of Systems For ROS see HPI     Objective:   Physical Exam Vitals reviewed.  Constitutional:      Appearance: Normal appearance.  HENT:     Head: Normocephalic and atraumatic.  Cardiovascular:     Pulses: Normal pulses.  Skin:    General: Skin is warm and dry.     Comments: Large area of desquamated skin on plantar surface of R foot.  No evidence of bacterial infection/cellulitis but peeling could represent fungal infection.  Neurological:     Mental Status: He is alert.  Psychiatric:        Mood and Affect: Mood normal.        Behavior: Behavior normal.        Thought Content: Thought content normal.           Assessment & Plan:   R foot blister- new.  No evidence of bacterial infection but concern for fungal infection given his use of steroid, sweaty feet, and peeling skin.  Start Nystatin powder 2-3x/day.  Pt expressed understanding and is in agreement w/ plan.

## 2022-11-15 NOTE — Patient Instructions (Signed)
Follow up as needed or as scheduled Apply the Nystatin powder twice daily to absorb sweat and treat any possible fungus Try and keep feet clean and dry Call with any questions or concerns Stay Safe!  Stay Healthy!

## 2022-12-04 ENCOUNTER — Ambulatory Visit: Payer: BC Managed Care – PPO | Admitting: Rheumatology

## 2022-12-07 ENCOUNTER — Encounter: Payer: Self-pay | Admitting: Orthopedic Surgery

## 2022-12-07 ENCOUNTER — Ambulatory Visit (INDEPENDENT_AMBULATORY_CARE_PROVIDER_SITE_OTHER): Payer: BC Managed Care – PPO | Admitting: Surgical

## 2022-12-07 DIAGNOSIS — M12811 Other specific arthropathies, not elsewhere classified, right shoulder: Secondary | ICD-10-CM | POA: Diagnosis not present

## 2022-12-07 DIAGNOSIS — M12812 Other specific arthropathies, not elsewhere classified, left shoulder: Secondary | ICD-10-CM | POA: Diagnosis not present

## 2022-12-07 NOTE — Progress Notes (Signed)
Follow-up Office Visit Note   Patient: Cameron Kim           Date of Birth: 1971/06/06           MRN: [id number] Visit Date: 12/07/2022 Requested by: Midge Minium, MD [address] [place],  [place] [address] PCP: Midge Minium, MD  Subjective: Chief Complaint  Patient presents with   Right Shoulder - Follow-up    HPI: Cameron Kim is a 52 y.o. male who returns to the office for follow-up visit.    Plan at last visit was: Impression is right shoulder arthritis and rotator cuff arthropathy. He has had CT scan but currently is taking a lot of prednisone. Not sure this is the best time for elective shoulder surgery. Would like to see him taper down a little bit more off of his prednisone. Follow-up in 2 more months so we can see where he is at and potentially plan for surgery at that time. May need repeat CT scan at that time based on timing of the replacement.   Since then, patient notes he feels he has a little bit better function in his shoulder in regards to his abduction active motion.  He is still on prednisone for polymyositis and has actually had to increase his dosage from 30 mg daily to 40 mg daily.  He is currently seeing Dr Carla [facility] at [facility]y.  Next appointment for follow-up is in June.  No new falls or injuries.  Mostly concerned about the weakness he has in the shoulder as he is experiencing a lot of of difficulty lifting the arm directly in front of him.              ROS: All systems reviewed are negative as they relate to the chief complaint within the history of present illness.  Patient denies fevers or chills.  Assessment & Plan: Visit Diagnoses:  1. Rotator cuff arthropathy of both shoulders     Plan: Cameron Kim is a 52 y.o. male who returns to the office for follow-up visit for right shoulder pain.  Plan from last visit was noted above in HPI.  They now return with continued right shoulder pain and dysfunction.  He has continued difficulty with  active forward elevation.  Continued weakness with external rotation and supraspinatus strength testing.  Axillary nerve is intact with deltoid firing.  Plan is to have patient follow-up in July after he sees his neurologist at [facility] in June.  He has had to increase his dose of prednisone for his polymyositis.  We will see how he is doing in July but if he has any concerns in the meantime, he will reach out to [place] via phone or MyChart.  Follow-Up Instructions: No follow-ups on file.   Orders:  No orders of the defined types were placed in this encounter.  No orders of the defined types were placed in this encounter.     Procedures: No procedures performed   Clinical Data: No additional findings.  Objective: Vital Signs: There were no vitals taken for this visit.  Physical Exam:  Constitutional: Patient appears well-developed HEENT:  Head: Normocephalic Eyes:EOM are normal Neck: Normal range of motion Cardiovascular: Normal rate Pulmonary/chest: Effort normal Neurologic: Patient is alert Skin: Skin is warm Psychiatric: Patient has normal mood and affect  Ortho Exam: Ortho exam demonstrates right shoulder with 40 degrees X rotation, 75 degrees abduction, 95 degrees forward elevation.  He has 3/5 infraspinatus and 4/5 supraspinatus  weakness of the right shoulder.  5/5 subscapularis strength.  Positive external rotation lag sign.  He has no tenderness over the [facility] joint.  Mild to moderate tenderness over the bicipital groove.  Intact EPL, FPL, finger abduction, wrist extension, pronation/supination, bicep, tricep.  Axillary nerve is intact with deltoid firing.  He has active abduction that is equivalent to passive abduction.  His active forward elevation is only about 30 degrees.  Specialty Comments:  No specialty comments available.  Imaging: No results found.   PMFS History: Patient Active Problem List   Diagnosis Date Noted   Thrombophlebitis of superficial veins of right  lower extremity 09/14/2022   Interstitial lung disease ([place]) 06/24/2022   History of DVT (deep vein thrombosis) 05/28/2022   Low serum albumin 05/13/2022   Pericardial effusion 02/28/2022   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   Unintentional weight loss 02/11/2022   Lower extremity edema 02/11/2022   Dysarthria 02/11/2022   Weakness generalized 02/11/2022   Chronic pain of both shoulders 02/11/2022   Dry skin 02/11/2022   History of non-ST elevation myocardial infarction (NSTEMI) 02/11/2022   Acute deep vein thrombosis (DVT) of lower extremity ([place]) 05/02/2021   Seizure disorder ([place]) 11/07/2020   Epistaxis 06/15/2020   Erectile dysfunction 10/31/2017   Adjustment disorder with depressed mood 03/25/2017   Glucose intolerance 02/25/2017   Physical exam 12/03/2014   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 fascicular block 01/18/2014   Hx of pulmonary embolus 01/13/2014   Soft tissue mass 01/13/2014   Overweight (BMI 25.0-29.9) 01/13/2014   Allergic rhinitis 01/13/2014   Past Medical History:  Diagnosis Date   Abnormal EKG    Ankle fracture, left    DVT (deep venous thrombosis) (HCC)    Dysrhythmia    extra beat or skips a beat   Idiopathic inflammatory myopathy    Peripheral vascular disease ([place]) 2013   dvt-pe after ankle fx   Polymyositis (HCC)    Pulmonary embolism ([place]) 05/2012   DVT traveled to lung   Seizure disorder ([place]) 11/07/2020   had 2 but none since not on seizure meds    Family History  Problem Relation Age of Onset   Diabetes Mother    Hypertension Mother    Heart murmur Mother    Stroke Mother    Diabetes Father    Hypertension Father    ALS Father    Stroke Sister    Rheum arthritis Brother    Diabetes Paternal Grandmother    Diabetes Paternal Grandfather    Colon cancer Neg Hx    Esophageal cancer Neg Hx    Rectal cancer Neg Hx    Stomach cancer Neg Hx     Past Surgical History:  Procedure Laterality Date   CARDIAC CATHETERIZATION  2021    COLONOSCOPY     I & D EXTREMITY Right 06/07/2016   Procedure: IRRIGATION AND DEBRIDEMENT RIGHT WRIST,MEDIAN NERVE EXPLORATION NEUROLYSIS AND REPAIR;  Surgeon: Roseanne Kaufman, MD;  Location: [place];  Service: Orthopedics;  Laterality: Right;   medial nerve surgry right arm     MUSCLE BIOPSY Right 07/02/2022   Procedure: RIGHT THIGH MUSCLE BIOPSY;  Surgeon: Jovita Kussmaul, MD;  Location: [place];  Service: General;  Laterality: Right;   RIGHT HEART CATH N/A 09/12/2022   Procedure: RIGHT HEART CATH;  Surgeon: Larey Dresser, MD;  Location: [place] CV [facility];  Service: Cardiovascular;  Laterality: N/A;   Social History   Occupational History   Occupation: disabled  Tobacco Use  Smoking status: Never    Passive exposure: Never   Smokeless tobacco: Never  Vaping Use   Vaping Use: Never used  Substance and Sexual Activity   Alcohol use: Not Currently   Drug use: No   Sexual activity: Yes

## 2022-12-21 ENCOUNTER — Ambulatory Visit: Payer: BC Managed Care – PPO | Admitting: Rheumatology

## 2023-01-01 ENCOUNTER — Telehealth: Payer: Self-pay | Admitting: Internal Medicine

## 2023-01-01 DIAGNOSIS — J8489 Other specified interstitial pulmonary diseases: Secondary | ICD-10-CM

## 2023-01-01 NOTE — Telephone Encounter (Signed)
Calling to set up appt w/Dr. R. Appt was for PFT and 30 min Dr. Hilaria Ota.  PT states he would like to have a CK test done (Creatine Kinase).  I  can see that a Dr. Kathee Delton has this under "Active requests" for this PT. PT said this Dr. Is retired and wants Dr. Chase Caller to order this to be done.  Pls call PT to advise. [phone]

## 2023-01-02 NOTE — Telephone Encounter (Signed)
Called and spoke with patient. He verbalized understanding. Order has been placed.   Nothing further needed.  

## 2023-01-02 NOTE — Telephone Encounter (Signed)
Called and spoke with pt. Pt trheumatologist has retired. Pt was referred to [facility] with a new doctor. Pt was wondering if he can get blood work for his CK levels. Pt is not able to make it all the way to [facility] just for this one test. Dr.Ramaswamy please advise if this is okay.

## 2023-01-02 NOTE — Telephone Encounter (Signed)
Ok to order CK here . No problem  Dr Estanislado Pandy is NOT retired. Who is he saying has retired?  Thanks    SIGNATURE    Dr. Brand Males, M.D., F.C.C.P,  Pulmonary and Critical Care Medicine Staff Physician, [place] Director - Interstitial Lung Disease  Program  Medical Director - [place] [facility] [place] at [place], [place], [address]   Pager: [phone], If no answer  -[place] or [place] [phone] Telephone (clinical office): [phone] Telephone (research): [phone]  3:28 PM 01/02/2023

## 2023-01-03 ENCOUNTER — Other Ambulatory Visit: Payer: BC Managed Care – PPO

## 2023-01-03 DIAGNOSIS — M359 Systemic involvement of connective tissue, unspecified: Secondary | ICD-10-CM

## 2023-01-03 DIAGNOSIS — J8489 Other specified interstitial pulmonary diseases: Secondary | ICD-10-CM | POA: Diagnosis not present

## 2023-01-03 NOTE — Addendum Note (Signed)
Addended by: Suzzanne Cloud E on: 01/03/2023 03:47 PM   Modules accepted: Orders

## 2023-01-04 LAB — CK: Total CK: 865 U/L — ABNORMAL HIGH (ref 44–196)

## 2023-01-11 NOTE — Progress Notes (Signed)
CK 865 smome beter than 1000 2 months ago but worse than 5 months ago when it was 265.He should report it to doc at [facility]

## 2023-02-26 ENCOUNTER — Ambulatory Visit: Payer: BC Managed Care – PPO | Admitting: Internal Medicine

## 2023-02-27 ENCOUNTER — Inpatient Hospital Stay (HOSPITAL_BASED_OUTPATIENT_CLINIC_OR_DEPARTMENT_OTHER): Payer: BC Managed Care – PPO | Admitting: Hematology and Oncology

## 2023-02-27 ENCOUNTER — Other Ambulatory Visit: Payer: Self-pay | Admitting: *Deleted

## 2023-02-27 ENCOUNTER — Other Ambulatory Visit: Payer: Self-pay

## 2023-02-27 ENCOUNTER — Inpatient Hospital Stay: Payer: BC Managed Care – PPO | Attending: Hematology and Oncology

## 2023-02-27 VITALS — BP 151/88 | HR 102 | Temp 98.5°F | Resp 16 | Wt 284.0 lb

## 2023-02-27 DIAGNOSIS — Z86718 Personal history of other venous thrombosis and embolism: Secondary | ICD-10-CM | POA: Diagnosis not present

## 2023-02-27 DIAGNOSIS — Z862 Personal history of diseases of the blood and blood-forming organs and certain disorders involving the immune mechanism: Secondary | ICD-10-CM | POA: Insufficient documentation

## 2023-02-27 DIAGNOSIS — Z86711 Personal history of pulmonary embolism: Secondary | ICD-10-CM | POA: Insufficient documentation

## 2023-02-27 DIAGNOSIS — Z7901 Long term (current) use of anticoagulants: Secondary | ICD-10-CM | POA: Insufficient documentation

## 2023-02-27 DIAGNOSIS — Z7952 Long term (current) use of systemic steroids: Secondary | ICD-10-CM

## 2023-02-27 DIAGNOSIS — I82432 Acute embolism and thrombosis of left popliteal vein: Secondary | ICD-10-CM

## 2023-02-27 DIAGNOSIS — Z79899 Other long term (current) drug therapy: Secondary | ICD-10-CM | POA: Diagnosis not present

## 2023-02-27 DIAGNOSIS — D72829 Elevated white blood cell count, unspecified: Secondary | ICD-10-CM | POA: Diagnosis not present

## 2023-02-27 DIAGNOSIS — R600 Localized edema: Secondary | ICD-10-CM

## 2023-02-27 DIAGNOSIS — I493 Ventricular premature depolarization: Secondary | ICD-10-CM

## 2023-02-27 DIAGNOSIS — I3139 Other pericardial effusion (noninflammatory): Secondary | ICD-10-CM

## 2023-02-27 DIAGNOSIS — G7249 Other inflammatory and immune myopathies, not elsewhere classified: Secondary | ICD-10-CM

## 2023-02-27 LAB — CMP (CANCER CENTER ONLY)
ALT: 21 U/L (ref 0–44)
AST: 42 U/L — ABNORMAL HIGH (ref 15–41)
Albumin: 3.7 g/dL (ref 3.5–5.0)
Alkaline Phosphatase: 46 U/L (ref 38–126)
Anion gap: 6 (ref 5–15)
BUN: 12 mg/dL (ref 6–20)
CO2: 29 mmol/L (ref 22–32)
Calcium: 8.8 mg/dL — ABNORMAL LOW (ref 8.9–10.3)
Chloride: 105 mmol/L (ref 98–111)
Creatinine: 1.11 mg/dL (ref 0.61–1.24)
GFR, Estimated: >60 mL/min (ref >60)
Glucose, Bld: 141 mg/dL — ABNORMAL HIGH (ref 70–99)
Potassium: 4.3 mmol/L (ref 3.5–5.1)
Sodium: 140 mmol/L (ref 135–145)
Total Bilirubin: 0.8 mg/dL (ref 0.3–1.2)
Total Protein: 6.5 g/dL (ref 6.5–8.1)

## 2023-02-27 LAB — CBC WITH DIFFERENTIAL (CANCER CENTER ONLY)
Abs Immature Granulocytes: 0.11 10*3/uL — ABNORMAL HIGH (ref 0.00–0.07)
Basophils Absolute: 0.1 10*3/uL (ref 0.0–0.1)
Basophils Relative: 0 %
Eosinophils Absolute: 0.1 10*3/uL (ref 0.0–0.5)
Eosinophils Relative: 1 %
HCT: 45.4 % (ref 39.0–52.0)
Hemoglobin: 14.7 g/dL (ref 13.0–17.0)
Immature Granulocytes: 1 %
Lymphocytes Relative: 8 %
Lymphs Abs: 1.1 10*3/uL (ref 0.7–4.0)
MCH: 30.2 pg (ref 26.0–34.0)
MCHC: 32.4 g/dL (ref 30.0–36.0)
MCV: 93.2 fL (ref 80.0–100.0)
Monocytes Absolute: 0.7 10*3/uL (ref 0.1–1.0)
Monocytes Relative: 5 %
Neutro Abs: 12.1 10*3/uL — ABNORMAL HIGH (ref 1.7–7.7)
Neutrophils Relative %: 85 %
Platelet Count: 203 10*3/uL (ref 150–400)
RBC: 4.87 MIL/uL (ref 4.22–5.81)
RDW: 13.5 % (ref 11.5–15.5)
WBC Count: 14.1 10*3/uL — ABNORMAL HIGH (ref 4.0–10.5)
nRBC: 0 % (ref 0.0–0.2)

## 2023-02-27 NOTE — Progress Notes (Signed)
[facility] [phone]   [phone]  PROGRESS NOTE  Patient Care Team: Sheliah Hatch, MD as PCP - General (Family Medicine) Runell Gess, MD as PCP - Cardiology (Cardiology) Pollyann Savoy, MD as Consulting Physician (Rheumatology)  Hematological/Oncological History # Recurrent DVTs 1) 05/2012: Diagnosed with pulmonary embolism and left lower leg DVT. Treated with coumadin. Provoking factor felt to be left ankle fracture and prolonged immobility with car travel. (Records not available for review).   2)04/17/2021: Doppler US confirmed acute DVT involving the left popliteal and left gastrocnemius vein. Started on Xarelto.    3) 05/02/2021: Established care with Cameron Kaufmann PA-C   Interval History:  Cameron Kim 52 y.o. male with medical history significant for recurrent DVTs who presents for a follow up visit. The patient's last visit was on 08/28/2022. In the interim since the last visit he has had no major changes in his health.  On exam today [name] reports he has been well overall in the interim since her last visit.  He reports that he does get short of breath with frequent movement, though his weight is increasing.  His weight is up to 284 pounds today, up from 265 pounds in January.  He reports that he is not having any leg pain but does have some occasional "tingling".  He is taking his Eliquis 5 mg twice daily faithfully without any bleeding, bruising, or dark stools.  He reports his medications overall are expensive but he is not sure what the average monthly cost is of his Eliquis.  He reports that he mostly present on his "expense card account". He otherwise denies any fevers, chills, sweats, nausea, vomiting or diarrhea.  Full 10 point ROS is listed below.  MEDICAL HISTORY:  Past Medical History:  Diagnosis Date   Abnormal EKG    Ankle fracture, left    DVT (deep venous thrombosis) (HCC)    Dysrhythmia    extra beat or  skips a beat   Idiopathic inflammatory myopathy    Peripheral vascular disease (HCC) 2013   dvt-pe after ankle fx   Polymyositis (HCC)    Pulmonary embolism (HCC) 05/2012   DVT traveled to lung   Seizure disorder (HCC) 11/07/2020   had 2 but none since not on seizure meds    SURGICAL HISTORY: Past Surgical History:  Procedure Laterality Date   CARDIAC CATHETERIZATION  2021   COLONOSCOPY     I & D EXTREMITY Right 06/07/2016   Procedure: IRRIGATION AND DEBRIDEMENT RIGHT WRIST,MEDIAN NERVE EXPLORATION NEUROLYSIS AND REPAIR;  Surgeon: Dominica Severin, MD;  Location: [facility];  Service: Orthopedics;  Laterality: Right;   medial nerve surgry right arm     MUSCLE BIOPSY Right 07/02/2022   Procedure: RIGHT THIGH MUSCLE BIOPSY;  Surgeon: Griselda Miner, MD;  Location: [facility] OR;  Service: General;  Laterality: Right;   RIGHT HEART CATH N/A 09/12/2022   Procedure: RIGHT HEART CATH;  Surgeon: Laurey Morale, MD;  Location: [facility] INVASIVE CV LAB;  Service: Cardiovascular;  Laterality: N/A;    SOCIAL HISTORY: Social History   Socioeconomic History   Marital status: Married    Spouse name: Not on file   Number of children: 0   Years of education: Not on file   Highest education level: Not on file  Occupational History   Occupation: disabled  Tobacco Use   Smoking status: Never    Passive exposure: Never   Smokeless tobacco: Never  Vaping Use   Vaping  Use: Never used  Substance and Sexual Activity   Alcohol use: Not Currently   Drug use: No   Sexual activity: Yes  Other Topics Concern   Not on file  Social History Narrative   Not on file   Social Determinants of Health   Financial Resource Strain: Not on file  Food Insecurity: Not on file  Transportation Needs: Not on file  Physical Activity: Not on file  Stress: Not on file  Social Connections: Not on file  Intimate Partner Violence: Not on file    FAMILY HISTORY: Family History  Problem Relation Age of Onset   Diabetes  Mother    Hypertension Mother    Heart murmur Mother    Stroke Mother    Diabetes Father    Hypertension Father    ALS Father    Stroke Sister    Rheum arthritis Brother    Diabetes Paternal Grandmother    Diabetes Paternal Grandfather    Colon cancer Neg Hx    Esophageal cancer Neg Hx    Rectal cancer Neg Hx    Stomach cancer Neg Hx     ALLERGIES:  has No Known Allergies.  MEDICATIONS:  Current Outpatient Medications  Medication Sig Dispense Refill   amLODipine (NORVASC) 5 MG tablet Take 5 mg by mouth daily.     amLODipine (NORVASC) 5 MG tablet Take 5 mg by mouth daily.     apixaban (ELIQUIS) 5 MG TABS tablet Take 1 tablet (5 mg total) by mouth 2 (two) times daily. 60 tablet 3   Cyanocobalamin (VITAMIN B-12 PO) Take 1 tablet by mouth daily.     FOLIC ACID PO Take 1 tablet by mouth daily.     hydroxychloroquine (PLAQUENIL) 200 MG tablet Take 1 tablet (200 mg total) by mouth 2 (two) times daily. 60 tablet 2   nystatin (MYCOSTATIN/NYSTOP) powder Apply 1 Application topically 2 (two) times daily. 60 g 3   predniSONE (DELTASONE) 10 MG tablet Take 1 tablet by mouth daily. (Patient taking differently: Take 10 mg by mouth daily.) 30 tablet 0   predniSONE (DELTASONE) 20 MG tablet TAKE 2 TABLETS BY MOUTH IN THE MORNING AND 1 TABLET IN THE EVENING 90 tablet 0   sildenafil (VIAGRA) 100 MG tablet Take 0.5-1 tablets (50-100 mg total) by mouth daily as needed for erectile dysfunction. 5 tablet 11   sulfamethoxazole-trimethoprim (BACTRIM DS) 800-160 MG tablet TAKE 1 TABLET BY MOUTH 3 TIMES A WEEK 36 tablet 0   No current facility-administered medications for this visit.    REVIEW OF SYSTEMS:   Constitutional: ( - ) fevers, ( - )  chills , ( - ) night sweats Eyes: ( - ) blurriness of vision, ( - ) double vision, ( - ) watery eyes Ears, nose, mouth, throat, and face: ( - ) mucositis, ( - ) sore throat Respiratory: ( - ) cough, ( - ) dyspnea, ( - ) wheezes Cardiovascular: ( - ) palpitation, (  - ) chest discomfort, ( - ) lower extremity swelling Gastrointestinal:  ( - ) nausea, ( - ) heartburn, ( - ) change in bowel habits Skin: ( - ) abnormal skin rashes Lymphatics: ( - ) new lymphadenopathy, ( - ) easy bruising Neurological: ( - ) numbness, ( - ) tingling, ( - ) new weaknesses Behavioral/Psych: ( - ) mood change, ( - ) new changes  All other systems were reviewed with the patient and are negative.  PHYSICAL EXAMINATION:  Vitals:   02/27/23 1141  BP: (!) 151/88  Pulse: (!) 102  Resp: 16  Temp: 98.5 F (36.9 C)  SpO2: 97%    Filed Weights   02/27/23 1141  Weight: 284 lb (128.8 kg)     GENERAL: well appearing middle aged [place] American male. alert, no distress and comfortable SKIN: skin color, texture, turgor are normal, no rashes or significant lesions EYES: conjunctiva are pink and non-injected, sclera clear LUNGS: clear to auscultation and percussion with normal breathing effort HEART: regular rate & rhythm and no murmurs and no lower extremity edema Musculoskeletal: no cyanosis of digits and no clubbing  PSYCH: alert & oriented x 3, fluent speech NEURO: no focal motor/sensory deficits  LABORATORY DATA:  I have reviewed the data as listed    Latest Ref Rng & Units 02/27/2023   11:16 AM 10/18/2022    9:04 AM 09/12/2022   11:20 AM  CBC  WBC 4.0 - 10.5 K/uL 14.1  19.9    Hemoglobin 13.0 - 17.0 g/dL 57.8  46.9  62.9    52.8   Hematocrit 39.0 - 52.0 % 45.4  45.5  44.0    45.0   Platelets 150 - 400 K/uL 203  356         Latest Ref Rng & Units 02/27/2023   11:16 AM 10/18/2022    9:04 AM 09/12/2022   11:20 AM  CMP  Glucose 70 - 99 mg/dL 413  89    BUN 6 - 20 mg/dL 12  20    Creatinine 2.44 - 1.24 mg/dL 0.10  2.72    Sodium 536 - 145 mmol/L 140  140  140    140   Potassium 3.5 - 5.1 mmol/L 4.3  4.6  3.7    3.7   Chloride 98 - 111 mmol/L 105  102    CO2 22 - 32 mmol/L 29  29    Calcium 8.9 - 10.3 mg/dL 8.8  9.1    Total Protein 6.5 - 8.1 g/dL 6.5  6.9     Total Bilirubin 0.3 - 1.2 mg/dL 0.8  0.7    Alkaline Phos 38 - 126 U/L 46     AST 15 - 41 U/L 42  33    ALT 0 - 44 U/L 21  14      RADIOGRAPHIC STUDIES: No results found.  ASSESSMENT & PLAN Cameron Kim is a 52 y.o. male returns for a follow up for recurrent DVTs.    #Recurrent LE DVT: --First episode in 2013 involved PE and DVT in left lower leg following left ankle fracture and prolonged car travel. --Second episode occurred on 04/17/2021 whcih involved DVT of left popliteal vein and the gastrocnemius veins. --There is no provoking factor that caused recent DVT on 04/17/2021 so recommend indefinite anticoagulation. --Discontinued Xarelto around April/May 2023 due to cost.  --Hypercoagulable workup from 05/02/2021 was unremarkable except for mild elevation of anticardiolipin IgM levels. --continue Eliquis 5 mg twice daily --medication is cost inhibitive. Will discuss with financial department to see if there are options to help lower his cost.  --RTC in 6 months   #Leukocytosis-Neutrophilic Predominance #Thrombocytosis-resolved --No infectious symptoms and patient is afebrile today --Sed rate was elevated and positive ANA levels when PCP checked on 05/11/2022 which is concerning for rheumatological process --labs today show WBC 14.1, hemoglobin 14.7, MCV 93.2, and platelets of 203. Cr 1.11 --Currently following with Dr. Corliss Skains in Rheumatology and [facility]. --had visit with Dr. Marchelle Gearing in Pulmonary, concern for interstitial disease with connective  tissue disease.    #Normocytic Anemia-Resolved.  --etiologies include inflammatory process, nutritional deficiencies,  --evidence of B12 and folate deficiency confirmed with labs on 02/08/2022.  --Advised to follow up with PCP to recheck levels to determine if he needs to continue.  No orders of the defined types were placed in this encounter.   All questions were answered. The patient knows to call the clinic with any  problems, questions or concerns.  A total of more than 30 minutes were spent on this encounter with face-to-face time and non-face-to-face time, including preparing to see the patient, ordering tests and/or medications, counseling the patient and coordination of care as outlined above.   Cameron Barns, MD Department of Hematology/Oncology [facility] at [facility] Phone: [phone] Pager: [phone] Email: [email or web address]  02/27/2023 3:41 PM

## 2023-02-28 ENCOUNTER — Other Ambulatory Visit: Payer: Self-pay

## 2023-02-28 DIAGNOSIS — J8489 Other specified interstitial pulmonary diseases: Secondary | ICD-10-CM

## 2023-02-28 NOTE — Addendum Note (Signed)
Addended by: Mertha Finders A on: 02/28/2023 03:53 PM   Modules accepted: Orders

## 2023-03-01 ENCOUNTER — Ambulatory Visit (INDEPENDENT_AMBULATORY_CARE_PROVIDER_SITE_OTHER): Payer: BC Managed Care – PPO | Admitting: Internal Medicine

## 2023-03-01 DIAGNOSIS — M359 Systemic involvement of connective tissue, unspecified: Secondary | ICD-10-CM

## 2023-03-01 DIAGNOSIS — J8489 Other specified interstitial pulmonary diseases: Secondary | ICD-10-CM | POA: Diagnosis not present

## 2023-03-01 LAB — PULMONARY FUNCTION TEST
DL/VA % pred: 88 %
DL/VA: 3.85 ml/min/mmHg/L
DLCO cor % pred: 51 %
DLCO cor: 15.93 ml/min/mmHg
DLCO unc % pred: 51 %
DLCO unc: 15.97 ml/min/mmHg
FEF 25-75 Pre: 2.27 L/sec
FEF2575-%Pred-Pre: 63 %
FEV1-%Pred-Pre: 56 %
FEV1-Pre: 2.35 L
FEV1FVC-%Pred-Pre: 104 %
FEV6-%Pred-Pre: 55 %
FEV6-Pre: 2.87 L
FEV6FVC-%Pred-Pre: 103 %
FVC-%Pred-Pre: 54 %
FVC-Pre: 2.89 L
Pre FEV1/FVC ratio: 81 %
Pre FEV6/FVC Ratio: 100 %

## 2023-03-01 NOTE — Progress Notes (Signed)
Spirometry and DLCO completed today  ?

## 2023-03-06 ENCOUNTER — Ambulatory Visit: Payer: BC Managed Care – PPO | Admitting: Internal Medicine

## 2023-03-11 ENCOUNTER — Encounter: Payer: BC Managed Care – PPO | Admitting: Internal Medicine

## 2023-03-11 DIAGNOSIS — M0579 Rheumatoid arthritis with rheumatoid factor of multiple sites without organ or systems involvement: Secondary | ICD-10-CM | POA: Diagnosis not present

## 2023-03-11 DIAGNOSIS — M3320 Polymyositis, organ involvement unspecified: Secondary | ICD-10-CM | POA: Diagnosis not present

## 2023-03-11 DIAGNOSIS — Z79899 Other long term (current) drug therapy: Secondary | ICD-10-CM | POA: Diagnosis not present

## 2023-03-11 NOTE — Progress Notes (Signed)
 This encounter was created in error - please disregard.

## 2023-03-11 NOTE — Patient Instructions (Signed)
ICD-10-CM   1. Interstitial lung disease due to connective tissue disease (HCC)  J84.89    M35.9     2. Dermatomyositis affecting respiratory system (HCC)  M33.91     3. Jo-1 positive polymyositis (HCC)  M33.20     4. Cyclic citrullinated peptide (CCP) antibody positive  R76.8     5. Elevated CK  R74.8       -Pulmonary fibrosis itself appears to be stable.   -Pulmonary hypertension was very mild/borderline; do not qualify for pulmonary hypertension treatment at this point - However it appears dermatomyositis/polymyositis is still active   Plan - I will connect with Dr. Apte at [facility] again  -Will check if she wants to do IVIG versus IV Rituxan  -Will check if she is okay doing the infusions at [facility] -Currently we will hold off on any antifibrotic until and unless we document any progression -Repeat spirometry and DLCO in 3-4 months -Continue current medications through rheumatology - check CK 11/06/2022   Follow-up - Return to see Dr. Roe Koffman for a 30-minute visit in 3-4 months but after spirometry and DLCO  -Symptoms & Walk test at follow-up 

## 2023-03-20 ENCOUNTER — Ambulatory Visit (INDEPENDENT_AMBULATORY_CARE_PROVIDER_SITE_OTHER): Payer: BC Managed Care – PPO | Admitting: Family Medicine

## 2023-03-20 ENCOUNTER — Encounter: Payer: Self-pay | Admitting: Family Medicine

## 2023-03-20 ENCOUNTER — Telehealth: Payer: Self-pay

## 2023-03-20 VITALS — BP 130/76 | HR 78 | Temp 97.9°F | Resp 17 | Ht 73.0 in | Wt 276.0 lb

## 2023-03-20 DIAGNOSIS — Z125 Encounter for screening for malignant neoplasm of prostate: Secondary | ICD-10-CM

## 2023-03-20 DIAGNOSIS — Z0000 Encounter for general adult medical examination without abnormal findings: Secondary | ICD-10-CM

## 2023-03-20 DIAGNOSIS — E669 Obesity, unspecified: Secondary | ICD-10-CM

## 2023-03-20 LAB — CBC WITH DIFFERENTIAL/PLATELET
Basophils Absolute: 0.1 10*3/uL (ref 0.0–0.1)
Basophils Relative: 0.5 % (ref 0.0–3.0)
Eosinophils Absolute: 0 10*3/uL (ref 0.0–0.7)
Eosinophils Relative: 0.1 % (ref 0.0–5.0)
HCT: 46.2 % (ref 39.0–52.0)
Hemoglobin: 14.5 g/dL (ref 13.0–17.0)
Lymphocytes Relative: 12.5 % (ref 12.0–46.0)
Lymphs Abs: 2.5 10*3/uL (ref 0.7–4.0)
MCHC: 31.4 g/dL (ref 30.0–36.0)
MCV: 92.2 fl (ref 78.0–100.0)
Monocytes Absolute: 1.9 10*3/uL — ABNORMAL HIGH (ref 0.1–1.0)
Monocytes Relative: 9.1 % (ref 3.0–12.0)
Neutro Abs: 15.8 10*3/uL — ABNORMAL HIGH (ref 1.4–7.7)
Neutrophils Relative %: 77.8 % — ABNORMAL HIGH (ref 43.0–77.0)
Platelets: 321 10*3/uL (ref 150.0–400.0)
RBC: 5.01 Mil/uL (ref 4.22–5.81)
RDW: 14.8 % (ref 11.5–15.5)
WBC: 20.3 10*3/uL (ref 4.0–10.5)

## 2023-03-20 LAB — BASIC METABOLIC PANEL
BUN: 20 mg/dL (ref 6–23)
CO2: 28 mEq/L (ref 19–32)
Calcium: 9.7 mg/dL (ref 8.4–10.5)
Chloride: 100 mEq/L (ref 96–112)
Creatinine, Ser: 0.89 mg/dL (ref 0.40–1.50)
GFR: 98.6 mL/min (ref >60.00)
Glucose, Bld: 89 mg/dL (ref 70–99)
Potassium: 4 mEq/L (ref 3.5–5.1)
Sodium: 138 mEq/L (ref 135–145)

## 2023-03-20 LAB — HEPATIC FUNCTION PANEL
ALT: 24 U/L (ref 0–53)
AST: 27 U/L (ref 0–37)
Albumin: 4.3 g/dL (ref 3.5–5.2)
Alkaline Phosphatase: 50 U/L (ref 39–117)
Bilirubin, Direct: 0.2 mg/dL (ref 0.0–0.3)
Total Bilirubin: 1 mg/dL (ref 0.2–1.2)
Total Protein: 7.3 g/dL (ref 6.0–8.3)

## 2023-03-20 LAB — LIPID PANEL
Cholesterol: 198 mg/dL (ref 0–200)
HDL: 58.7 mg/dL (ref >39.00)
LDL Cholesterol: 109 mg/dL — ABNORMAL HIGH (ref 0–99)
NonHDL: 139.34
Total CHOL/HDL Ratio: 3
Triglycerides: 150 mg/dL — ABNORMAL HIGH (ref 0.0–149.0)
VLDL: 30 mg/dL (ref 0.0–40.0)

## 2023-03-20 LAB — HEMOGLOBIN A1C: Hgb A1c MFr Bld: 6.1 % (ref 4.6–6.5)

## 2023-03-20 LAB — TSH: TSH: 1.74 u[IU]/mL (ref 0.35–5.50)

## 2023-03-20 LAB — PSA: PSA: 0.78 ng/mL (ref 0.10–4.00)

## 2023-03-20 NOTE — Assessment & Plan Note (Signed)
Pt's PE unchanged from previous.  UTD on Tdap, colonoscopy.  Is following w/ multiple specialists and is being well cared for.  Check labs.  Anticipatory guidance provided.

## 2023-03-20 NOTE — Progress Notes (Signed)
   Subjective:    Patient ID: Cameron Kim, male    DOB: 06-16-1971, 52 y.o.   MRN: [id number]  HPI CPE- UTD on Tdap, colonoscopy  Patient Care Team    Relationship Specialty Notifications Start End  Sheliah Hatch, MD PCP - General Family Medicine  01/13/14   Runell Gess, MD PCP - Cardiology Cardiology  05/09/22   Pollyann Savoy, MD Consulting Physician Rheumatology  07/20/22      Health Maintenance  Topic Date Due   INFLUENZA VACCINE  05/09/2023   DTaP/Tdap/Td (3 - Td or Tdap) 12/03/2024   Colonoscopy  08/23/2032   HPV VACCINES  Aged Out   COVID-19 Vaccine  Discontinued   Hepatitis C Screening  Discontinued   HIV Screening  Discontinued   Zoster Vaccines- Shingrix  Discontinued     Review of Systems Patient reports no vision/hearing changes, anorexia, fever ,adenopathy, persistant/recurrent hoarseness, swallowing issues, chest pain, palpitations, edema, persistant/recurrent cough, hemoptysis, gastrointestinal  bleeding (melena, rectal bleeding), abdominal pain, excessive heart burn, GU symptoms (dysuria, hematuria, voiding/incontinence issues) syncope, focal weakness, memory loss, hair/nail changes, depression, anxiety, abnormal bruising/bleeding, musculoskeletal symptoms/signs.   + SOB w/ exertion- known interstitial lung disease + skin changes/rashes- has seen Rheum, Derm referral pending + diarrhea- episodic + numbness of hands/feet    Objective:   Physical Exam General Appearance:    Alert, cooperative, no distress, appears stated age, obese  Head:    Normocephalic, without obvious abnormality, atraumatic  Eyes:    PERRL, conjunctiva/corneas clear, EOM's intact both eyes       Ears:    Normal TM's and external ear canals, both ears  Nose:   Nares normal, septum midline, mucosa normal, no drainage   or sinus tenderness  Throat:   Lips, mucosa, and tongue normal; teeth and gums normal  Neck:   Supple, symmetrical, trachea midline, no adenopathy;       thyroid:   No enlargement/tenderness/nodules  Back:     Symmetric, no curvature, ROM normal, no CVA tenderness  Lungs:     Clear to auscultation bilaterally, respirations unlabored  Chest wall:    No tenderness or deformity  Heart:    Regular rate and rhythm, S1 and S2 normal, no murmur, rub   or gallop  Abdomen:     Soft, non-tender, bowel sounds active all four quadrants,    no masses, no organomegaly  Genitalia:    deferred  Rectal:    Extremities:   Extremities normal, atraumatic, no cyanosis or edema  Pulses:   2+ and symmetric all extremities  Skin:   Skin color, texture, turgor normal, + skin rashes  Lymph nodes:   Cervical, supraclavicular, and axillary nodes normal  Neurologic:   CNII-XII intact. Normal strength, sensation and reflexes      throughout          Assessment & Plan:

## 2023-03-20 NOTE — Patient Instructions (Addendum)
Follow up in 6 months to recheck blood pressure We'll notify you of your lab results and make any changes if needed Continue to work on healthy diet and regular physical activity- you can do it! Call with any questions or concerns Stay Safe!  Stay Healthy! Have a great summer!!!

## 2023-03-20 NOTE — Assessment & Plan Note (Signed)
Ongoing issue for pt.  Likely due to limited physical activity and steroid use.  Encouraged healthy diet and regular physical activity.  Will follow.

## 2023-03-20 NOTE — Telephone Encounter (Signed)
-----   Message from Sheliah Hatch, MD sent at 03/20/2023  1:48 PM EDT ----- Labs look good w/ exception of markedly elevated white blood cells.  This may be related to your prednisone use but I'm going to send these results to Dr Leonides Schanz to review and see if any changes need to be made

## 2023-03-20 NOTE — Telephone Encounter (Signed)
I have the results on pt VM

## 2023-03-26 DIAGNOSIS — Z79899 Other long term (current) drug therapy: Secondary | ICD-10-CM | POA: Insufficient documentation

## 2023-04-05 DIAGNOSIS — M3320 Polymyositis, organ involvement unspecified: Secondary | ICD-10-CM | POA: Diagnosis not present

## 2023-04-07 DIAGNOSIS — M3320 Polymyositis, organ involvement unspecified: Secondary | ICD-10-CM | POA: Diagnosis not present

## 2023-04-10 ENCOUNTER — Encounter: Payer: Self-pay | Admitting: Orthopedic Surgery

## 2023-04-10 ENCOUNTER — Ambulatory Visit (INDEPENDENT_AMBULATORY_CARE_PROVIDER_SITE_OTHER): Payer: BC Managed Care – PPO | Admitting: Orthopedic Surgery

## 2023-04-10 DIAGNOSIS — M12812 Other specific arthropathies, not elsewhere classified, left shoulder: Secondary | ICD-10-CM

## 2023-04-10 DIAGNOSIS — M12811 Other specific arthropathies, not elsewhere classified, right shoulder: Secondary | ICD-10-CM

## 2023-04-10 NOTE — Progress Notes (Signed)
Office Visit Note   Patient: Cameron Kim           Date of Birth: June 19, 1971           MRN: [id number] Visit Date: 04/10/2023 Requested by: Sheliah Hatch, MD [address] [place],  [place] [address] PCP: Sheliah Hatch, MD  Subjective: Chief Complaint  Patient presents with   Right Shoulder - Pain    HPI: Cameron Kim is a 52 y.o. male who presents to the office reporting right shoulder pain.  Overall his shoulder is a little bit better than it was last clinic visit.  He is getting immunoglobulin infusions at [facility] and his CK remains elevated at 956.  Has not been able to taper his steroids.  Currently the shoulder is livable..                ROS: All systems reviewed are negative as they relate to the chief complaint within the history of present illness.  Patient denies fevers or chills.  Assessment & Plan: Visit Diagnoses:  1. Rotator cuff arthropathy of both shoulders     Plan: Impression is right shoulder rotator cuff arthropathy but he does have about 90 degrees of forward flexion and abduction.  Not ideal but certainly the risk outweigh the benefits of surgical intervention at this time for the shoulder.  He will follow-up as needed once his symptoms worsen.  Follow-Up Instructions: No follow-ups on file.   Orders:  No orders of the defined types were placed in this encounter.  No orders of the defined types were placed in this encounter.     Procedures: No procedures performed   Clinical Data: No additional findings.  Objective: Vital Signs: There were no vitals taken for this visit.  Physical Exam:  Constitutional: Patient appears well-developed HEENT:  Head: Normocephalic Eyes:EOM are normal Neck: Normal range of motion Cardiovascular: Normal rate Pulmonary/chest: Effort normal Neurologic: Patient is alert Skin: Skin is warm Psychiatric: Patient has normal mood and affect  Ortho Exam: Ortho exam demonstrates about 90 degrees of  forward flexion and abduction.  Deltoid is functional.  Rotator cuff strength is weak to external rotation.  Motor or sensory function to the hand is intact and radial pulses intact.  Specialty Comments:  No specialty comments available.  Imaging: No results found.   PMFS History: Patient Active Problem List   Diagnosis Date Noted   Thrombophlebitis of superficial veins of right lower extremity 09/14/2022   Interstitial lung disease (HCC) 06/24/2022   History of DVT (deep vein thrombosis) 05/28/2022   Low serum albumin 05/13/2022   Pericardial effusion 02/28/2022   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   Lower extremity edema 02/11/2022   Weakness generalized 02/11/2022   Chronic pain of both shoulders 02/11/2022   Dry skin 02/11/2022   History of non-ST elevation myocardial infarction (NSTEMI) 02/11/2022   Acute deep vein thrombosis (DVT) of lower extremity (HCC) 05/02/2021   Seizure disorder (HCC) 11/07/2020   Erectile dysfunction 10/31/2017   Glucose intolerance 02/25/2017   Physical exam 12/03/2014   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 fascicular block 01/18/2014   Hx of pulmonary embolus 01/13/2014   Soft tissue mass 01/13/2014   Obesity (BMI 30-39.9) 01/13/2014   Allergic rhinitis 01/13/2014   Past Medical History:  Diagnosis Date   Abnormal EKG    Ankle fracture, left    DVT (deep venous thrombosis) (HCC)    Dysrhythmia    extra beat or skips a beat  Idiopathic inflammatory myopathy    Peripheral vascular disease (HCC) 2013   dvt-pe after ankle fx   Polymyositis (HCC)    Pulmonary embolism (HCC) 05/2012   DVT traveled to lung   Seizure disorder (HCC) 11/07/2020   had 2 but none since not on seizure meds    Family History  Problem Relation Age of Onset   Diabetes Mother    Hypertension Mother    Heart murmur Mother    Stroke Mother    Diabetes Father    Hypertension Father    ALS Father    Stroke Sister    Rheum arthritis Brother    Diabetes Paternal  Grandmother    Diabetes Paternal Grandfather    Colon cancer Neg Hx    Esophageal cancer Neg Hx    Rectal cancer Neg Hx    Stomach cancer Neg Hx     Past Surgical History:  Procedure Laterality Date   CARDIAC CATHETERIZATION  2021   COLONOSCOPY     I & D EXTREMITY Right 06/07/2016   Procedure: IRRIGATION AND DEBRIDEMENT RIGHT WRIST,MEDIAN NERVE EXPLORATION NEUROLYSIS AND REPAIR;  Surgeon: Dominica Severin, MD;  Location: [facility] OR;  Service: Orthopedics;  Laterality: Right;   medial nerve surgry right arm     MUSCLE BIOPSY Right 07/02/2022   Procedure: RIGHT THIGH MUSCLE BIOPSY;  Surgeon: Griselda Miner, MD;  Location: [facility] OR;  Service: General;  Laterality: Right;   RIGHT HEART CATH N/A 09/12/2022   Procedure: RIGHT HEART CATH;  Surgeon: Laurey Morale, MD;  Location: [facility] INVASIVE CV LAB;  Service: Cardiovascular;  Laterality: N/A;   Social History   Occupational History   Occupation: disabled  Tobacco Use   Smoking status: Never    Passive exposure: Never   Smokeless tobacco: Never  Vaping Use   Vaping Use: Never used  Substance and Sexual Activity   Alcohol use: Not Currently   Drug use: No   Sexual activity: Yes

## 2023-04-16 ENCOUNTER — Encounter: Payer: Self-pay | Admitting: Family Medicine

## 2023-04-16 ENCOUNTER — Ambulatory Visit (INDEPENDENT_AMBULATORY_CARE_PROVIDER_SITE_OTHER): Payer: BC Managed Care – PPO | Admitting: Family Medicine

## 2023-04-16 VITALS — BP 128/78 | HR 87 | Temp 99.0°F | Ht 73.0 in | Wt 277.4 lb

## 2023-04-16 DIAGNOSIS — L03113 Cellulitis of right upper limb: Secondary | ICD-10-CM | POA: Diagnosis not present

## 2023-04-16 MED ORDER — CEPHALEXIN 500 MG PO CAPS
500.0000 mg | ORAL_CAPSULE | Freq: Three times a day (TID) | ORAL | 0 refills | Status: AC
Start: 2023-04-16 — End: 2023-04-26

## 2023-04-16 NOTE — Patient Instructions (Signed)
-  START Keflex 500mg  tablet, take 1 tablet every 8 hours for 10 days.  -If pain, swelling, redness, or you develop a fever, please follow up sooner.  -Follow up with Dr. Beverely Low as scheduled 07/15.  -Please call [facility] and let them know about the cellulitis so they can be aware. They may need to use your left arm for the next infusion.

## 2023-04-16 NOTE — Progress Notes (Signed)
   Acute Office Visit   Subjective:  Patient ID: Cameron Kim, male    DOB: [birth year], 52 y.o.   MRN: [id number]  Chief Complaint  Patient presents with   Arm Swelling    Pt is here today with C/O of arm swelling rt  and is in hand Pt reports he had infusion 6/28-6/30, [facility]  Pt reports he has not contacted them.    HPI  Patient is a 52 year old male that presents with right arm swelling. About 4-5 days ago he noticed a pain in the right forearm/wrist and radiated to his right hand. Pain described as sharp and aching. Constant pain with intensity changes. Then, he noticed swelling and numbness in the hand. He had an immune globumin 10% injfeciton 80g-volume: 800 plus pre and most hydration on 06/28 and 06/30. He reports on 06/28, his IV site was in his right hand and on 06/30 his IV site was in the right forearm above where the pain and swelling is located.   Denies fever.   ROS See HPI above      Objective:   BP 128/78   Pulse 87   Temp 99 F (37.2 C)   Ht 6\' 1"  (1.854 m)   Wt 277 lb 6 oz (125.8 kg)   SpO2 99%   BMI 36.60 kg/m    Physical Exam Vitals reviewed.  Constitutional:      General: He is not in acute distress.    Appearance: Normal appearance. He is obese. He is not ill-appearing, toxic-appearing or diaphoretic.  HENT:     Head: Normocephalic and atraumatic.  Eyes:     General:        Right eye: No discharge.        Left eye: No discharge.     Conjunctiva/sclera: Conjunctivae normal.  Cardiovascular:     Rate and Rhythm: Normal rate.  Pulmonary:     Effort: Pulmonary effort is normal. No respiratory distress.  Musculoskeletal:        General: Normal range of motion.  Skin:    General: Skin is warm and dry.     Comments: Right wrist and forearm into right hand: edema, redness,. Able to move hand and fingers. Radial pulse is normal.   Neurological:     General: No focal deficit present.     Mental Status: He is alert and oriented to  person, place, and time. Mental status is at baseline.  Psychiatric:        Mood and Affect: Mood normal.        Behavior: Behavior normal.        Thought Content: Thought content normal.        Judgment: Judgment normal.           Assessment & Plan:  Cellulitis of right upper extremity -     Cephalexin; Take 1 capsule (500 mg total) by mouth 3 (three) times daily for 10 days.  Dispense: 30 capsule; Refill: 0  -Prescribed Keflex 500mg  tablet, take 1 tablet every 8 hours for 10 days.  -Advised if he has increased pain, swelling, redness, or develops a fever, please follow up sooner.  -Follow up with Dr. Beverely Low as scheduled 07/15.  -Please call [facility] and let them know about the cellulitis so they can be aware. They may need to use your left arm for the next infusion.    Zandra Abts, NP

## 2023-04-22 ENCOUNTER — Ambulatory Visit: Payer: BC Managed Care – PPO | Admitting: Family Medicine

## 2023-04-22 ENCOUNTER — Ambulatory Visit (HOSPITAL_BASED_OUTPATIENT_CLINIC_OR_DEPARTMENT_OTHER)
Admission: RE | Admit: 2023-04-22 | Discharge: 2023-04-22 | Disposition: A | Discharge status: Home / Self Care | Payer: BC Managed Care – PPO | Source: Ambulatory Visit | Attending: Family Medicine | Admitting: Family Medicine

## 2023-04-22 VITALS — BP 118/76 | HR 95 | Temp 98.0°F | Resp 18 | Ht 73.0 in | Wt 283.0 lb

## 2023-04-22 DIAGNOSIS — I82611 Acute embolism and thrombosis of superficial veins of right upper extremity: Secondary | ICD-10-CM | POA: Diagnosis not present

## 2023-04-22 DIAGNOSIS — M7989 Other specified soft tissue disorders: Secondary | ICD-10-CM

## 2023-04-22 DIAGNOSIS — F419 Anxiety disorder, unspecified: Secondary | ICD-10-CM | POA: Diagnosis not present

## 2023-04-22 DIAGNOSIS — R600 Localized edema: Secondary | ICD-10-CM | POA: Diagnosis not present

## 2023-04-22 DIAGNOSIS — M79631 Pain in right forearm: Secondary | ICD-10-CM | POA: Insufficient documentation

## 2023-04-22 DIAGNOSIS — M25461 Effusion, right knee: Secondary | ICD-10-CM | POA: Diagnosis not present

## 2023-04-22 NOTE — Patient Instructions (Signed)
Follow up as needed or as scheduled We're working on getting your ultrasound scheduled to rule out a clot in your arm For now, ice or heat- whichever feels better Call with any questions or concerns Hang in there!!

## 2023-04-22 NOTE — Assessment & Plan Note (Signed)
New.  Pt has had quite a bit of health issues in the last 2 yrs and this has led to quite a bit of anxiety.  He is in constant pain, on daily prednisone which makes him irritable, is not able to work at this time which causes financial concerns.  He has a dog at home that helps calm him and ease his anxiety but he is fearful that if he has to move for financial reasons, the dog won't be allowed to come and he can't handle the idea of parting with him.  He is not interested in taking the animal to public places or traveling with him.  Will provide a letter indicating that current pet is providing emotional support for pt to have if he needs to relocate.

## 2023-04-22 NOTE — Progress Notes (Signed)
   Subjective:    Patient ID: Cameron Kim, male    DOB: 1971-07-15, 52 y.o.   MRN: [id number]  HPI Anxiety- pt is wanting to register his Cameron Kim as an emotional support animal.  Feels that anxiety improves when dog is present.  His long term prednisone use has increased irritability.  Dog is socialized but pt is not interested in having him by his side at all times and for all activities.  Current medical situation and job situation are in flux and he worries about a possible upcoming move and being able to stay w/ his dog that brings him comfort and support.  R arm swelling- new.  Pt reports wrist and forearm are swollen.  Tender and uncomfortably 'tight'.  Had infusion on that side.  Review of Systems For ROS see HPI     Objective:   Physical Exam Vitals reviewed.  Constitutional:      General: He is not in acute distress.    Appearance: Normal appearance. He is obese. He is not ill-appearing.  HENT:     Head: Normocephalic and atraumatic.  Cardiovascular:     Pulses: Normal pulses.  Musculoskeletal:        General: Swelling (R forearm, TTP) and tenderness (R forearm) present.  Skin:    General: Skin is warm and dry.     Findings: Rash (skin lesions on L forearm) present.  Neurological:     Mental Status: He is alert and oriented to person, place, and time.  Psychiatric:        Mood and Affect: Mood normal.        Behavior: Behavior normal.        Thought Content: Thought content normal.           Assessment & Plan:  R forearm swelling- new.  Pt reports he had an infusion at that site and area has been swollen and painful since.  Area is tight to touch.  Concern is for DVT as pt has had these in the past.  Get [place] to assess.  Pt expressed understanding and is in agreement w/ plan.

## 2023-04-23 ENCOUNTER — Telehealth: Payer: Self-pay

## 2023-04-23 NOTE — Telephone Encounter (Signed)
Pt was given ABX for swelling asking if he should stop that at this time

## 2023-04-23 NOTE — Telephone Encounter (Signed)
-----   Message from Neena Rhymes sent at 04/23/2023  7:23 AM EDT ----- Thankfully no evidence of DVT (deep clot).  There is a superficial clot- likely where you had your infusion- but this is not dangerous or concerning.  Apply heat to the area to help the clot reabsorb and help w/ pain and swelling

## 2023-04-23 NOTE — Telephone Encounter (Signed)
Pt informed

## 2023-04-23 NOTE — Telephone Encounter (Signed)
He can finish that as directed

## 2023-04-30 DIAGNOSIS — G7249 Other inflammatory and immune myopathies, not elsewhere classified: Secondary | ICD-10-CM | POA: Diagnosis not present

## 2023-04-30 DIAGNOSIS — N189 Chronic kidney disease, unspecified: Secondary | ICD-10-CM | POA: Diagnosis not present

## 2023-04-30 DIAGNOSIS — I129 Hypertensive chronic kidney disease with stage 1 through stage 4 chronic kidney disease, or unspecified chronic kidney disease: Secondary | ICD-10-CM | POA: Diagnosis not present

## 2023-04-30 DIAGNOSIS — R809 Proteinuria, unspecified: Secondary | ICD-10-CM | POA: Diagnosis not present

## 2023-05-02 DIAGNOSIS — Z79899 Other long term (current) drug therapy: Secondary | ICD-10-CM | POA: Diagnosis not present

## 2023-05-02 DIAGNOSIS — M3320 Polymyositis, organ involvement unspecified: Secondary | ICD-10-CM | POA: Diagnosis not present

## 2023-05-02 DIAGNOSIS — M0579 Rheumatoid arthritis with rheumatoid factor of multiple sites without organ or systems involvement: Secondary | ICD-10-CM | POA: Diagnosis not present

## 2023-05-03 ENCOUNTER — Encounter: Payer: Self-pay | Admitting: Internal Medicine

## 2023-05-03 ENCOUNTER — Ambulatory Visit (INDEPENDENT_AMBULATORY_CARE_PROVIDER_SITE_OTHER): Payer: BC Managed Care – PPO | Admitting: Internal Medicine

## 2023-05-03 VITALS — BP 140/80 | HR 84 | Ht 73.0 in | Wt 286.6 lb

## 2023-05-03 DIAGNOSIS — M359 Systemic involvement of connective tissue, unspecified: Secondary | ICD-10-CM | POA: Diagnosis not present

## 2023-05-03 DIAGNOSIS — E669 Obesity, unspecified: Secondary | ICD-10-CM

## 2023-05-03 DIAGNOSIS — R768 Other specified abnormal immunological findings in serum: Secondary | ICD-10-CM

## 2023-05-03 DIAGNOSIS — J8410 Pulmonary fibrosis, unspecified: Secondary | ICD-10-CM

## 2023-05-03 DIAGNOSIS — M3320 Polymyositis, organ involvement unspecified: Secondary | ICD-10-CM | POA: Diagnosis not present

## 2023-05-03 DIAGNOSIS — M3391 Dermatopolymyositis, unspecified with respiratory involvement: Secondary | ICD-10-CM

## 2023-05-03 DIAGNOSIS — J8489 Other specified interstitial pulmonary diseases: Secondary | ICD-10-CM | POA: Diagnosis not present

## 2023-05-03 DIAGNOSIS — R0609 Other forms of dyspnea: Secondary | ICD-10-CM

## 2023-05-03 DIAGNOSIS — Z7952 Long term (current) use of systemic steroids: Secondary | ICD-10-CM

## 2023-05-03 DIAGNOSIS — D849 Immunodeficiency, unspecified: Secondary | ICD-10-CM

## 2023-05-03 LAB — C-REACTIVE PROTEIN: CRP: <1 mg/dL (ref 0.5–20.0)

## 2023-05-03 LAB — D-DIMER, QUANTITATIVE: D-Dimer, Quant: 1.31 mcg/mL FEU — ABNORMAL HIGH (ref <0.50)

## 2023-05-03 NOTE — Patient Instructions (Addendum)
ICD-10-CM   1. Interstitial lung disease due to connective tissue disease (HCC)  J84.89    M35.9     2. Dermatomyositis affecting respiratory system (HCC)  M33.91     3. Jo-1 positive polymyositis (HCC)  M33.20     4. Cyclic citrullinated peptide (CCP) antibody positive  R76.8     5. Immunosuppressed status (HCC)  D84.9     6. Current chronic use of systemic steroids  Z79.52     7. DOE (dyspnea on exertion)  R06.09     8. Obesity (BMI 30-39.9)  E66.9       -I agree shortness of breath is worse but this could be related to weight gain [100 pounds in the last 1 year] as opposed to pulmonary fibrosis getting worse.  Although we do need to be open minded about pulmonary fibrosis getting worse especially in the setting of autoimmune disease that is potentially active.  Plan - Continue  -Monitor for disease activity 05/03/2023 OV autoimmune disease  -Get CRP and also total CK  -Talk to primary care physician about weight loss drugs  -Get RSV vaccine and flu shot in the fall  -Monitor for pulmonary fibrosis progression  = check ONO on room air ` - -Do spirometry and DLCO in 8-12 weeks -Do high-resolution CT chest in 8-12 weeks Any progression we will consider antifibrotic-   -Rule out any blood clot  -Check blood D-dimer today    Follow-up - Return to see Dr. Marchelle Gearing for a 30-minute visit in 8-12 weeks but after spirometry and DLCO and CT chest  -Symptoms & Walk test at follow-up

## 2023-05-03 NOTE — Progress Notes (Signed)
OV 06/26/2022  Subjective:  Patient ID: Cameron Kim, male , DOB: 04/21/1971 , age 52 y.o. , MRN: [id number] , ADDRESS: [address] [place] [address] PCP Sheliah Hatch, MD Patient Care Team: Sheliah Hatch, MD as PCP - General (Family Medicine) Runell Gess, MD as PCP - Cardiology (Cardiology)  This Provider for this visit: Treatment Team:  Attending Provider: Kalman Shan, MD    06/26/2022 -   Chief Complaint  Patient presents with   Consult    Wheezing/ cough      HPI Cameron Kim 52 y.o. -referral from the rheumatology Dr. Corliss Skains  who he saw for the first time on 05/31/2022.  He has been diagnosed with elevated CK and Jo 1 positive polymyositis.  He used to work in [employer].  He has had COVID booster mRNA 3 times.  He had COVID in November 2022 and sometime after that started developing symptoms of weakness of the proximal muscles.  This also included dysphagia, dysphonia and dysarthria after he saw rheumatology on 05/31/2022 he was placed on prednisone 40 mg twice daily.  After this is significantly improved.  His results were elevated CK which is improving.  Jo 1 antibody positive positive ANA.  QuantiFERON gold indeterminate.  He has not been referred to [facility] where he has an upcoming appointment.  In the last few to several months he is also noticing cough with some wheezing.  He is also choking on saliva.  There is no shortness of breath per se but then he is weak and is not able to walk too much.  He is not disabled from his work.  He did have a CT scan of the abdomen 06/08/2022 in the lung images do show abnormalities.  However walking desaturation test today with this is normal.  He does not have echocardiogram.  He does not have pulmonary function test.  There is no high-resolution CT chest.    [facility] consult 9/122/23 Dr Steva Colder      No data to display         Chief Complaint  Patient presents with   Polymyositis   Cameron Kim is a 52 y.o. male who presents for evaluation of longstanding anti-Jo-1 antibody positive inflammatory myositis. The patient is accompanied by his wife, [name].  Beginning in 08/2021, the patient noticed decreased strength which worsened to the point he was unable to move or sit up in bed. He saw his primary care provider in 02/2022 and has seen multiple physicians since. He recently saw a rheumatologist locally who diagnosed him with polymyositis and referred him here to [facility]. [name] also indicates he has difficulty swallowing and his voice has changed. He notes a weight loss of 48 pounds in 9 months. He also mentions he had COVID-19 around the same time. The patient endorses a rash which he feels looks like ringworm with extreme itchiness and flakiness.   The patient has a history of a torn rotator cuff but was denied surgery due to a protein abnormality. Once he was provided steroids on 06/01/2022, the patient improved overall. He continues to have stiffness which impacts his lifestyle.   The patient has had numerous tests including DEXA scan, upper abdominal exam, and is scheduled for a biopsy next week. He has noticed increased moodiness and appetite with prednisone. He also indicates he has insomnia. His prednisone taper was started at 80 mg and currently he continues on 60 mg  daily.  The only joint pain [name] notes currently is hand pain with inability to fully close his hands. Prior to prednisone, he had all over joint pain.   Family History: Father diagnosed with ALS. Social History: He worked in [employer]. Past medical history:  - Blood clots in 2013 and 2022, currently on blood thinners.    A/P  [name] is a 52 y.o. male with likely an overlap disease with anti-Jo-1 antibody, Ro antibodies, rheumatoid factor, and CCP positivity.   He has signs and symptoms classic for dermatomyositis with history of heliotropic rash, mechanics hands,  Gottron's papules, proximal muscle weakness, and inflammatory arthritis, currently on prednisone 60 mg. He is planning to get tapered with his local rheumatologist down to 50 mg in the next few weeks.   He is scheduled for a muscle biopsy on Monday, and he will plan to start CellCept on Tuesday after he undergoes the muscle biopsy. We discussed the risks and benefits of CellCept, azathioprine, methotrexate, IVIG and for now the goal will be to start the CellCept and titrate it up slowly until he can tolerate 1500 mg twice daily.  1. Jo-1 positive polymyositis (CMS-HCC) 2. High risk medication use 3. Seropositive RA - mycophenolate (CELLCEPT) 500 mg tablet; Take 1 tablet (500 mg total) by mouth 2 (two) times daily for 14 days, THEN 2 tablets (1,000 mg total) 2 (two) times daily for 14 days, THEN 3 tablets (1,500 mg total) 2 (two) times daily for 90 days. - continue prednisone 60 mg for now, follow taper as suggested by local rheumatologist   OV 08/28/2022  Subjective:  Patient ID: Cameron Kim, male , DOB: 1971-02-14 , age 65 y.o. , MRN: [id number] , ADDRESS: [address] [place] [address] PCP Sheliah Hatch, MD Patient Care Team: Sheliah Hatch, MD as PCP - General (Family Medicine) Runell Gess, MD as PCP - Cardiology (Cardiology) Pollyann Savoy, MD as Consulting Physician (Rheumatology)  This Provider for this visit: Treatment Team:  Attending Provider: Kalman Shan, MD    08/28/2022 -   Chief Complaint  Patient presents with   Follow-up    Pt states he has been doing okay since last visit.     HPI Cameron Kim 52 y.o. -returns for follow-up.  Since last seeing me he has seen Dr. Steva Colder at [facility] rheumatology.  Her notes were reviewed and copied and pasted above.  She is diagnosed with combination of dermatomyositis/polymyositis with seropositive rheumatoid arthritis.  She started him on CellCept along with Bactrim.  High-dose  prednisone which is being tapered now by local rheumatologist Dr Alyce Pagan.  He is beginning to feel better.  His muscle strength is improved.  He did have interstitial lung disease workup.  He had an echocardiogram that shows enlarged pulmonary arteries with suggestion of pulmonary hypertension.  He also had high-resolution d discuss the implications of the findings.  He recollects a left heart catheterization a few years ago at [facility] when he was incarcerated.  But it does not sound like he has had a right heart catheterization.   His current symptoms are as below   No results found for: "NITRICOXIDE"  Latest Reference Range & Units 05/31/22 11:02 06/19/22 16:30 07/31/22 08:40  CK Total 44 - 196 U/L 3,806 (H) 475 (H) 265 (H)     OV 11/06/2022  Subjective:  Patient ID: Nettie Kim, male , DOB: Dec 27, 1970 , age 60 y.o. , MRN: [id number] , ADDRESS: [address] [place] [address] PCP Sheliah Hatch, MD Patient Care Team: Sheliah Hatch, MD as PCP - General (Family Medicine) Runell Gess, MD as PCP - Cardiology (Cardiology) Pollyann Savoy, MD as Consulting Physician (Rheumatology)  This Provider for this visit: Treatment Team:  Attending Provider: Kalman Shan, MD    11/06/2022 -   Chief Complaint  Patient presents with   Follow-up    PFT done today. Breathing is unchanged since the last visit.      HPI Cameron Kim 52 y.o. -he is here for follow-up.  After the last visit I emailed [facility] rheumatology Dr. Steva Colder and she emailed thought that 2 doses of Rituxan could be potential benefit for him in stabilizing his ILD.  He did see the other local [facility] rheumatology PA on 10/18/2022.  Was noted that he was taking Plaquenil, CellCept 100 mg 3 tablets by mouth twice daily along with Bactrim and prednisone 30 mg.  It was noted that he did not have any side effects..  He reported some arthralgia.  And also continued intermittent  rashes as well as increased frequency of Raynaud's.  However CK was elevated to 1100 [personally reviewed] [facility] rheumatology preferred that [facility] rheumatology take over his care.  On his visit to [facility] rheumatology Dr. Steva Colder on 10/29/2022 he noticed that his skin was worsening and experiencing dryness.  There was some suspicion of a hand infection and he did get antibiotic doxycycline prior to that visit.  Rheumatology at [facility] felt that his disease was not in remission.  She gave him handout on methotrexate.  Low-dose of amlodipine for the worsening Raynaud's was prescribed.  She also decided to start IVIG because of elevations in CK despite CellCept and Plaquenil and high-dose prednisone.  She personally emailed me today saying that current plan is for him to get IVIG at [facility]  1 mg/kg x2 days at ideal weight, which made the dose 80 mg, x2.  In the work she would consider IV Rituxan.    Today he tells me that he is now taking prednisone 40 mg/day since approximately 10/14/2022 since his CK was elevated.  Also for the last week or so since seeing [facility] rheumatology he is on amlodipine.  So far he is not sure things have improved particularly in the foot over the Raynaud's.  He is open to getting CK checked  Of note he did have right heart catheterization by Dr. Marca Ancona in December 2023.  He does have pulmonary artery pressures bordeline elevated but his pulmonary vascular resistance is less than 3. PCWP 12, MAP 25 and PVR < 2  From a respiratory standpoint he is stable and walking desaturation test is stable and pulmonary function test is stable records suggesting that his ILD is actually still stable.  Although given the elevated CK he is at risk of progression in the future.   OV 03/11/2023  Subjective:  Patient ID: Cameron Kim, male , DOB: Jun 07, 1971 , age 8 y.o. , MRN: [id number] , ADDRESS: [address] [place] [address] PCP Sheliah Hatch,  MD Patient Care Team: Sheliah Hatch, MD as PCP - General (Family Medicine) Runell Gess, MD as PCP - Cardiology (Cardiology) Pollyann Savoy, MD as Consulting Physician (Rheumatology)  This Provider for this visit: Treatment Team:  Attending Provider: Kalman Shan, MD    03/11/2023 -  No chief complaint on file.    HPI [employer] Kim  52 y.o. -       OV 05/03/2023  Subjective:  Patient ID: Cameron Kim, male , DOB: [birth year] , age 33 y.o. , MRN: [id number] , ADDRESS: [address] [place] [address] PCP Sheliah Hatch, MD Patient Care Team: Sheliah Hatch, MD as PCP - General (Family Medicine) Runell Gess, MD as PCP - Cardiology (Cardiology) Pollyann Savoy, MD as Consulting Physician (Rheumatology)  This Provider for this visit: Treatment Team:  Attending Provider: Kalman Shan, MD    05/03/2023 -   Chief Complaint  Patient presents with   Follow-up    F/up on PFT  polymyositis and probable UIP/ILD on CT scan last CT scan September 2023.   treatment plan of Dr. Steva Colder at [facility]  -CellCept  -Daily prednisone 40 mg/day  - bactrim,  - plaqueni  -Status post cycle 1 Rituxan 05/02/2023 and every 6 months after that  -IVIG per history given end of June 2024 and then second cycle early May 10, 2023     HPI Cameron Kim 52 y.o. -returns for follow-up.  He missed his appointment with me in June 2024 because of multiple other appointments.  Last seen January 2024.  Since then he is continue to CellCept daily prednisone, Plaquenil and Bactrim for PCP prophylaxis.  Based on his history and review of the external medical records from [facility] he did get IVIG and also recently Rituxan [see above].  He believes his muscle strength is improved.  In fact he was able to do sit/stand test 15 times easily without any issues.  His last CK that they have either with myself or at [facility] is in March 2024  and it shows improvement but still elevated [see below].  At [facility] he did have a CRP that was still elevated.  Despite all this he is continuing to experience increasing shortness of breath in the last few months.  Of note he had 100 pound weight gain in the last 1 year.  He did show a tendency to desaturate with a sit/stand test today although it was very transient to 87%.  And he was only mildly dyspneic.  Nevertheless he is worried about disease progression.      SYMPTOM SCALE - ILD 08/28/2022 11/06/2022  05/03/2023   Current weight  Cellcept , pred, plaquenil, bactrim CellCept, prednisone, Plaquenil, Bactrim IVIG June 2024 and Rituxan July 2024  130 kg  O2 use ra  ra  Shortness of Breath 0 -> 5 scale with 5 being worst (score 6 If unable to do)    At rest 0 0 0  Simple tasks - showers, clothes change, eating, shaving 2 0 1  Household (dishes, doing bed, laundry) 2 2 3   Shopping 2 0 2  Walking level at own pace 2 1 3   Walking up Stairs 3 1 4   Total (30-36) Dyspnea Score 11 4 13   How bad is your cough? 0 0 0  How bad is your fatigue 3 1 1   How bad is nausea 2 0 0  How bad is vomiting?  0 0 0  How bad is diarrhea? 3 0 0  How bad is anxiety? 2 1 3   How bad is depression 0 0 0  Any chronic pain - if so where and how bad x         Simple office walk 185 feet x  3 laps goal with forehead probe 06/26/2022  11/06/2022  05/03/2023  O2 used ra  ra  Number laps completed 3  Sit stand x 1 5  Comments about pace x    Resting Pulse Ox/HR 99% and 85/min 100% 96%-97% and HR 75  Final Pulse Ox/HR 97% and 105/min 96% 87%   92$ and HR 105  Desaturated </= 88% no    Desaturated <= 3% points no 4 points yes  Got Tachycardic >/= 90/min yes  yes  Symptoms at end of test x  1 of 10 dyspnea  Miscellaneous comments x     PFT     Latest Ref Rng & Units 03/01/2023   10:59 AM 11/06/2022   12:07 PM 08/10/2022    8:52 AM  ILD indicators  FVC-Pre L 2.89  3.03  2.89   FVC-Predicted  Pre % 54  56  53   FVC-Post L   2.95   FVC-Predicted Post %   54   TLC L   4.20   TLC Predicted %   56   DLCO uncorrected ml/min/mmHg 15.97  15.74  14.99   DLCO UNC %Pred % 51  51  48   DLCO Corrected ml/min/mmHg 15.93  15.49  14.87   DLCO COR %Pred % 51  50  47       Latest Reference Range & Units 05/31/22 11:02 06/19/22 16:30 07/31/22 08:40 09/06/22 11:36 09/24/22 09:17 10/18/22 09:04 11/06/22 13:46 01/03/23 15:47  CK Total 44 - 196 U/L 3,806 (H) 475 (H) 265 (H) 956 (H) 826 (H) 1,196 (H) 1,005 (H) 865 (H)  (H): Data is abnormally high  LAB RESULTS last 90 days Recent Results (from the past 2160 hour(s))  CMP (Cancer Center only)     Status: Abnormal   Collection Time: 02/27/23 11:16 AM  Result Value Ref Range   Sodium 140 135 - 145 mmol/L   Potassium 4.3 3.5 - 5.1 mmol/L   Chloride 105 98 - 111 mmol/L   CO2 29 22 - 32 mmol/L   Glucose, Bld 141 (H) 70 - 99 mg/dL    Comment: Glucose reference range applies only to samples taken after fasting for at least 8 hours.   BUN 12 6 - 20 mg/dL   Creatinine 4.09 8.11 - 1.24 mg/dL   Calcium 8.8 (L) 8.9 - 10.3 mg/dL   Total Protein 6.5 6.5 - 8.1 g/dL   Albumin 3.7 3.5 - 5.0 g/dL   AST 42 (H) 15 - 41 U/L   ALT 21 0 - 44 U/L   Alkaline Phosphatase 46 38 - 126 U/L   Total Bilirubin 0.8 0.3 - 1.2 mg/dL   GFR, Estimated >91 >47 mL/min    Comment: (NOTE) Calculated using the CKD-EPI Creatinine Equation (2021)    Anion gap 6 5 - 15    Comment: Performed at [facility], [address] [place], [place] [address]  CBC with Differential (Cancer Center Only)     Status: Abnormal   Collection Time: 02/27/23 11:16 AM  Result Value Ref Range   WBC Count 14.1 (H) 4.0 - 10.5 K/uL   RBC 4.87 4.22 - 5.81 MIL/uL   Hemoglobin 14.7 13.0 - 17.0 g/dL   HCT 21.3 08.6 - 57.8 %   MCV 93.2 80.0 - 100.0 fL   MCH 30.2 26.0 - 34.0 pg   MCHC 32.4 30.0 - 36.0 g/dL   RDW 46.9 62.9 - 52.8 %   Platelet Count 203 150 - 400 K/uL   nRBC  0.0 0.0 - 0.2 %  Neutrophils Relative % 85 %   Neutro Abs 12.1 (H) 1.7 - 7.7 K/uL   Lymphocytes Relative 8 %   Lymphs Abs 1.1 0.7 - 4.0 K/uL   Monocytes Relative 5 %   Monocytes Absolute 0.7 0.1 - 1.0 K/uL   Eosinophils Relative 1 %   Eosinophils Absolute 0.1 0.0 - 0.5 K/uL   Basophils Relative 0 %   Basophils Absolute 0.1 0.0 - 0.1 K/uL   WBC Morphology MORPHOLOGY UNREMARKABLE    RBC Morphology MORPHOLOGY UNREMARKABLE    Smear Review LARGE PLATELETS PRESENT    Immature Granulocytes 1 %   Abs Immature Granulocytes 0.11 (H) 0.00 - 0.07 K/uL    Comment: Performed at [facility], [address], [place], [place] [address]  Pulmonary function test     Status: None   Collection Time: 03/01/23 10:59 AM  Result Value Ref Range   FVC-Pre 2.89 L   FVC-%Pred-Pre 54 %   FEV1-Pre 2.35 L   FEV1-%Pred-Pre 56 %   FEV6-Pre 2.87 L   FEV6-%Pred-Pre 55 %   Pre FEV1/FVC ratio 81 %   FEV1FVC-%Pred-Pre 104 %   Pre FEV6/FVC Ratio 100 %   FEV6FVC-%Pred-Pre 103 %   FEF 25-75 Pre 2.27 L/sec   FEF2575-%Pred-Pre 63 %   DLCO unc 15.97 ml/min/mmHg   DLCO unc % pred 51 %   DLCO cor 15.93 ml/min/mmHg   DLCO cor % pred 51 %   DL/VA 6.44 ml/min/mmHg/L   DL/VA % pred 88 %  Lipid panel     Status: Abnormal   Collection Time: 03/20/23  8:10 AM  Result Value Ref Range   Cholesterol 198 0 - 200 mg/dL    Comment: ATP III Classification       Desirable:  < 200 mg/dL               Borderline High:  200 - 239 mg/dL          High:  > = 034 mg/dL   Triglycerides 742.5 (H) 0.0 - 149.0 mg/dL    Comment: Normal:  <956 mg/dLBorderline High:  150 - 199 mg/dL   HDL 38.75 >64.33 mg/dL   VLDL 29.5 0.0 - 18.8 mg/dL   LDL Cholesterol 416 (H) 0 - 99 mg/dL   Total CHOL/HDL Ratio 3     Comment:                Men          Women1/2 Average Risk     3.4          3.3Average Risk          5.0          4.42X Average Risk          9.6          7.13X Average Risk          15.0          11.0                        NonHDL 139.34     Comment: NOTE:  Non-HDL goal should be 30 mg/dL higher than patient's LDL goal (i.e. LDL goal of < 70 mg/dL, would have non-HDL goal of < 100 mg/dL)  Basic metabolic panel     Status: None   Collection Time: 03/20/23  8:10 AM  Result Value Ref Range   Sodium 138 135 - 145 mEq/L  Potassium 4.0 3.5 - 5.1 mEq/L   Chloride 100 96 - 112 mEq/L   CO2 28 19 - 32 mEq/L   Glucose, Bld 89 70 - 99 mg/dL   BUN 20 6 - 23 mg/dL   Creatinine, Ser 3.29 0.40 - 1.50 mg/dL   GFR 51.88 >41.66 mL/min    Comment: Calculated using the CKD-EPI Creatinine Equation (2021)   Calcium 9.7 8.4 - 10.5 mg/dL  TSH     Status: None   Collection Time: 03/20/23  8:10 AM  Result Value Ref Range   TSH 1.74 0.35 - 5.50 uIU/mL  Hepatic function panel     Status: None   Collection Time: 03/20/23  8:10 AM  Result Value Ref Range   Total Bilirubin 1.0 0.2 - 1.2 mg/dL   Bilirubin, Direct 0.2 0.0 - 0.3 mg/dL   Alkaline Phosphatase 50 39 - 117 U/L   AST 27 0 - 37 U/L   ALT 24 0 - 53 U/L   Total Protein 7.3 6.0 - 8.3 g/dL   Albumin 4.3 3.5 - 5.2 g/dL  CBC with Differential/Platelet     Status: Abnormal   Collection Time: 03/20/23  8:10 AM  Result Value Ref Range   WBC 20.3 Repeated and verified X2. (HH) 4.0 - 10.5 K/uL   RBC 5.01 4.22 - 5.81 Mil/uL   Hemoglobin 14.5 13.0 - 17.0 g/dL   HCT 06.3 01.6 - 01.0 %   MCV 92.2 78.0 - 100.0 fl   MCHC 31.4 30.0 - 36.0 g/dL   RDW 93.2 35.5 - 73.2 %   Platelets 321.0 150.0 - 400.0 K/uL   Neutrophils Relative % 77.8 (H) 43.0 - 77.0 %   Lymphocytes Relative 12.5 12.0 - 46.0 %   Monocytes Relative 9.1 3.0 - 12.0 %   Eosinophils Relative 0.1 0.0 - 5.0 %   Basophils Relative 0.5 0.0 - 3.0 %   Neutro Abs 15.8 (H) 1.4 - 7.7 K/uL   Lymphs Abs 2.5 0.7 - 4.0 K/uL   Monocytes Absolute 1.9 (H) 0.1 - 1.0 K/uL   Eosinophils Absolute 0.0 0.0 - 0.7 K/uL   Basophils Absolute 0.1 0.0 - 0.1 K/uL  Hemoglobin A1c     Status: None   Collection Time: 03/20/23  8:10 AM   Result Value Ref Range   Hgb A1c MFr Bld 6.1 4.6 - 6.5 %    Comment: Glycemic Control Guidelines for People with Diabetes:Non Diabetic:  <6%Goal of Therapy: <7%Additional Action Suggested:  >8%   PSA     Status: None   Collection Time: 03/20/23  8:10 AM  Result Value Ref Range   PSA 0.78 0.10 - 4.00 ng/mL    Comment: Test performed using [employer] PSA Assay, a parmagnetic partical, chemiluminecent immunoassay.         has a past medical history of Abnormal EKG, Ankle fracture, left, DVT (deep venous thrombosis) (HCC), Dysrhythmia, Idiopathic inflammatory myopathy, Peripheral vascular disease (HCC) (2013), Polymyositis (HCC), Pulmonary embolism (HCC) (05/2012), and Seizure disorder (HCC) (11/07/2020).   reports that he has never smoked. He has never been exposed to tobacco smoke. He has never used smokeless tobacco.  Past Surgical History:  Procedure Laterality Date   CARDIAC CATHETERIZATION  2021   COLONOSCOPY     I & D EXTREMITY Right 06/07/2016   Procedure: IRRIGATION AND DEBRIDEMENT RIGHT WRIST,MEDIAN NERVE EXPLORATION NEUROLYSIS AND REPAIR;  Surgeon: Dominica Severin, MD;  Location: [facility] OR;  Service: Orthopedics;  Laterality: Right;   medial nerve surgry right arm  MUSCLE BIOPSY Right 07/02/2022   Procedure: RIGHT THIGH MUSCLE BIOPSY;  Surgeon: Griselda Miner, MD;  Location: [facility] OR;  Service: General;  Laterality: Right;   RIGHT HEART CATH N/A 09/12/2022   Procedure: RIGHT HEART CATH;  Surgeon: Laurey Morale, MD;  Location: [facility] INVASIVE CV LAB;  Service: Cardiovascular;  Laterality: N/A;    No Known Allergies  Immunization History  Administered Date(s) Administered   Influenza,inj,Quad PF,6+ Mos 09/01/2018   Moderna SARS-COV2 Booster Vaccination 11/05/2020   PPD Test 07/18/2022   Tdap 10/08/2004, 12/03/2014    Family History  Problem Relation Age of Onset   Diabetes Mother    Hypertension Mother    Heart murmur Mother    Stroke Mother    Diabetes Father     Hypertension Father    ALS Father    Stroke Sister    Rheum arthritis Brother    Diabetes Paternal Grandmother    Diabetes Paternal Grandfather    Colon cancer Neg Hx    Esophageal cancer Neg Hx    Rectal cancer Neg Hx    Stomach cancer Neg Hx      Current Outpatient Medications:    amLODipine (NORVASC) 5 MG tablet, Take 5 mg by mouth daily., Disp: , Rfl:    apixaban (ELIQUIS) 5 MG TABS tablet, Take 1 tablet (5 mg total) by mouth 2 (two) times daily., Disp: 60 tablet, Rfl: 3   Cyanocobalamin (VITAMIN B-12 PO), Take 1 tablet by mouth daily., Disp: , Rfl:    FOLIC ACID PO, Take 1 tablet by mouth daily., Disp: , Rfl:    hydroxychloroquine (PLAQUENIL) 200 MG tablet, Take 1 tablet (200 mg total) by mouth 2 (two) times daily., Disp: 60 tablet, Rfl: 2   nystatin (MYCOSTATIN/NYSTOP) powder, Apply 1 Application topically 2 (two) times daily., Disp: 60 g, Rfl: 3   predniSONE (DELTASONE) 20 MG tablet, TAKE 2 TABLETS BY MOUTH IN THE MORNING AND 1 TABLET IN THE EVENING, Disp: 90 tablet, Rfl: 0   sildenafil (VIAGRA) 100 MG tablet, Take 0.5-1 tablets (50-100 mg total) by mouth daily as needed for erectile dysfunction., Disp: 5 tablet, Rfl: 11   sulfamethoxazole-trimethoprim (BACTRIM DS) 800-160 MG tablet, TAKE 1 TABLET BY MOUTH 3 TIMES A WEEK, Disp: 36 tablet, Rfl: 0      Objective:   Vitals:   05/03/23 0904  BP: (!) 140/80  Pulse: 84  SpO2: 97%  Weight: 286 lb 9.6 oz (130 kg)  Height: 6\' 1"  (1.854 m)    Estimated body mass index is 37.81 kg/m as calculated from the following:   Height as of this encounter: 6\' 1"  (1.854 m).   Weight as of this encounter: 286 lb 9.6 oz (130 kg).  @WEIGHTCHANGE @  [employer]   05/03/23 0904  Weight: 286 lb 9.6 oz (130 kg)     Physical Exam   General: No distress. obese O2 at rest: ra Cane present: no Sitting in wheel chair: no Frail: no Obese: YES Neuro: Alert and Oriented x 3. GCS 15. Speech normal Psych: Pleasant Resp:  Barrel Chest -  no.  Wheeze - no, Crackles - no, No overt respiratory distress CVS: Normal heart sounds. Murmurs - no Ext: Stigmata of Connective Tissue Disease - DM HEENT: Normal upper airway. PEERL +. No post nasal drip        Assessment:       ICD-10-CM   1. Interstitial lung disease due to connective tissue disease (HCC)  J84.89    M35.9  2. Dermatomyositis affecting respiratory system (HCC)  M33.91     3. Jo-1 positive polymyositis (HCC)  M33.20     4. Cyclic citrullinated peptide (CCP) antibody positive  R76.8     5. Immunosuppressed status (HCC)  D84.9     6. Current chronic use of systemic steroids  Z79.52     7. DOE (dyspnea on exertion)  R06.09     8. Obesity (BMI 30-39.9)  E66.9          Plan:     Patient Instructions     ICD-10-CM   1. Interstitial lung disease due to connective tissue disease (HCC)  J84.89    M35.9     2. Dermatomyositis affecting respiratory system (HCC)  M33.91     3. Jo-1 positive polymyositis (HCC)  M33.20     4. Cyclic citrullinated peptide (CCP) antibody positive  R76.8     5. Immunosuppressed status (HCC)  D84.9     6. Current chronic use of systemic steroids  Z79.52     7. DOE (dyspnea on exertion)  R06.09     8. Obesity (BMI 30-39.9)  E66.9       -I agree shortness of breath is worse but this could be related to weight gain [100 pounds in the last 1 year] as opposed to pulmonary fibrosis getting worse.  Although we do need to be open minded about pulmonary fibrosis getting worse especially in the setting of autoimmune disease that is potentially active.  Plan - Continue  -Monitor for disease activity 05/03/2023 OV autoimmune disease  -Get CRP and also total CK  -Talk to primary care physician about weight loss drugs  -Get RSV vaccine and flu shot in the fall  -Monitor for pulmonary fibrosis progression  = check ONO on room air ` - -Do spirometry and DLCO in 8-12 weeks -Do high-resolution CT chest in 8-12 weeks Any  progression we will consider antifibrotic-   -Rule out any blood clot  -Check blood D-dimer today    Follow-up - Return to see Dr. Marchelle Gearing for a 30-minute visit in 8-12 weeks but after spirometry and DLCO and CT chest  -Symptoms & Walk test at follow-up   FOLLOWUP Return in about 10 weeks (around 07/12/2023) for 30 min visit, after HRCT chest, after Cleda Daub and DLCO, ILD, with Dr Marchelle Gearing, Face to Face Visit.    SIGNATURE    Dr. Kalman Shan, M.D., F.C.C.P,  Pulmonary and Critical Care Medicine Staff Physician, [facility] Director - Interstitial Lung Disease  Program  Pulmonary Fibrosis [facility] at [facility] [place], [place], [address]  Pager: [phone], If no answer or between  15:00h - 7:00h: call [phone] Telephone: [phone]  9:43 AM 05/03/2023

## 2023-05-07 ENCOUNTER — Telehealth: Payer: Self-pay | Admitting: Internal Medicine

## 2023-05-07 DIAGNOSIS — Z86718 Personal history of other venous thrombosis and embolism: Secondary | ICD-10-CM

## 2023-05-07 DIAGNOSIS — R7989 Other specified abnormal findings of blood chemistry: Secondary | ICD-10-CM

## 2023-05-07 NOTE — Telephone Encounter (Signed)
Good news that CK is now normal. However, d-diumer somewhat high  Plan  - get VQ scan this week and doppler LE - rule out PE

## 2023-05-07 NOTE — Telephone Encounter (Signed)
Patient called to request a work order for his infusion which he said he will need to get it all done in one session.  He stated that they told him he would need a work order from the doctor to get it done at one time.  Please call patient to discuss further at [phone]

## 2023-05-08 ENCOUNTER — Telehealth: Payer: Self-pay | Admitting: Internal Medicine

## 2023-05-08 ENCOUNTER — Ambulatory Visit (HOSPITAL_COMMUNITY)
Admission: RE | Admit: 2023-05-08 | Discharge: 2023-05-08 | Disposition: A | Discharge status: Home / Self Care | Payer: BC Managed Care – PPO | Source: Ambulatory Visit | Attending: Internal Medicine | Admitting: Internal Medicine

## 2023-05-08 DIAGNOSIS — R7989 Other specified abnormal findings of blood chemistry: Secondary | ICD-10-CM | POA: Diagnosis not present

## 2023-05-08 DIAGNOSIS — Z86718 Personal history of other venous thrombosis and embolism: Secondary | ICD-10-CM | POA: Diagnosis not present

## 2023-05-08 NOTE — Telephone Encounter (Signed)
IMPRESSION: 1. No evidence of deep venous thrombosis in the visualized right upper extremity and contralateral left subclavian veins. 2. Superficial thrombus is demonstrated in the basilic vein with slow flow seen in the radial veins although patent.   Message sent to ordering provider and DOD.

## 2023-05-08 NOTE — Telephone Encounter (Signed)
Called and spoke with patient.  Dr. Jane Canary results and recommendations given.  Understanding stated.  VQ scan and LE doppler ordered.  Nothing further at this time.

## 2023-05-08 NOTE — Telephone Encounter (Signed)
Cameron Kim with [facility] calling with a report.  Results_Pos for DVT in his right gastroc vein  He was sent home because he is already on blood thinners, she added.  Arelia Sneddon [facility] [phone]

## 2023-05-08 NOTE — Progress Notes (Signed)
BLE venous duplex has been completed.  Preliminary findings given to Tanessa at Dr. Jane Canary office.   Results can be found under chart review under CV PROC. 05/08/2023 5:24 PM Ellijah Leffel RVT, RDMS

## 2023-05-09 NOTE — Telephone Encounter (Signed)
  Aleady on eliquis. AWait VQ scan results to make changes/recomendations   Current Outpatient Medications:    amLODipine (NORVASC) 5 MG tablet, Take 5 mg by mouth daily., Disp: , Rfl:    apixaban (ELIQUIS) 5 MG TABS tablet, Take 1 tablet (5 mg total) by mouth 2 (two) times daily., Disp: 60 tablet, Rfl: 3   Cyanocobalamin (VITAMIN B-12 PO), Take 1 tablet by mouth daily., Disp: , Rfl:    FOLIC ACID PO, Take 1 tablet by mouth daily., Disp: , Rfl:    hydroxychloroquine (PLAQUENIL) 200 MG tablet, Take 1 tablet (200 mg total) by mouth 2 (two) times daily., Disp: 60 tablet, Rfl: 2   nystatin (MYCOSTATIN/NYSTOP) powder, Apply 1 Application topically 2 (two) times daily., Disp: 60 g, Rfl: 3   predniSONE (DELTASONE) 20 MG tablet, TAKE 2 TABLETS BY MOUTH IN THE MORNING AND 1 TABLET IN THE EVENING, Disp: 90 tablet, Rfl: 0   sildenafil (VIAGRA) 100 MG tablet, Take 0.5-1 tablets (50-100 mg total) by mouth daily as needed for erectile dysfunction., Disp: 5 tablet, Rfl: 11   sulfamethoxazole-trimethoprim (BACTRIM DS) 800-160 MG tablet, TAKE 1 TABLET BY MOUTH 3 TIMES A WEEK, Disp: 36 tablet, Rfl: 0

## 2023-05-10 DIAGNOSIS — M3320 Polymyositis, organ involvement unspecified: Secondary | ICD-10-CM | POA: Diagnosis not present

## 2023-05-10 NOTE — Telephone Encounter (Signed)
Cameron Kim is taking care of his infusions. NFN

## 2023-05-11 DIAGNOSIS — M3320 Polymyositis, organ involvement unspecified: Secondary | ICD-10-CM | POA: Diagnosis not present

## 2023-05-13 NOTE — Telephone Encounter (Addendum)
Still awaiting the perfusion scan. PCC's, will you please advise [place] when this is scheduled? Thanks so much!

## 2023-05-13 NOTE — Telephone Encounter (Signed)
The order never hit our box because the message was sent from critical care the order fell under there box I will route to the box

## 2023-05-16 DIAGNOSIS — M3320 Polymyositis, organ involvement unspecified: Secondary | ICD-10-CM | POA: Diagnosis not present

## 2023-05-16 DIAGNOSIS — M0579 Rheumatoid arthritis with rheumatoid factor of multiple sites without organ or systems involvement: Secondary | ICD-10-CM | POA: Diagnosis not present

## 2023-05-16 DIAGNOSIS — Z79899 Other long term (current) drug therapy: Secondary | ICD-10-CM | POA: Diagnosis not present

## 2023-05-20 ENCOUNTER — Ambulatory Visit (HOSPITAL_COMMUNITY)
Admission: RE | Admit: 2023-05-20 | Discharge: 2023-05-20 | Disposition: A | Discharge status: Home / Self Care | Payer: BC Managed Care – PPO | Source: Ambulatory Visit | Attending: Internal Medicine | Admitting: Internal Medicine

## 2023-05-20 DIAGNOSIS — R7989 Other specified abnormal findings of blood chemistry: Secondary | ICD-10-CM | POA: Insufficient documentation

## 2023-05-20 DIAGNOSIS — R918 Other nonspecific abnormal finding of lung field: Secondary | ICD-10-CM | POA: Diagnosis not present

## 2023-05-20 DIAGNOSIS — Z86718 Personal history of other venous thrombosis and embolism: Secondary | ICD-10-CM | POA: Insufficient documentation

## 2023-05-20 DIAGNOSIS — J9811 Atelectasis: Secondary | ICD-10-CM | POA: Diagnosis not present

## 2023-05-20 DIAGNOSIS — R0602 Shortness of breath: Secondary | ICD-10-CM | POA: Diagnosis not present

## 2023-05-20 MED ORDER — TECHNETIUM TO 99M ALBUMIN AGGREGATED
3.9900 | Freq: Once | INTRAVENOUS | Status: AC
  Administered 2023-05-20: 3.99 via INTRAVENOUS

## 2023-06-06 DIAGNOSIS — M3320 Polymyositis, organ involvement unspecified: Secondary | ICD-10-CM | POA: Diagnosis not present

## 2023-06-07 DIAGNOSIS — M3320 Polymyositis, organ involvement unspecified: Secondary | ICD-10-CM | POA: Diagnosis not present

## 2023-07-04 DIAGNOSIS — M3320 Polymyositis, organ involvement unspecified: Secondary | ICD-10-CM | POA: Diagnosis not present

## 2023-07-05 DIAGNOSIS — M3320 Polymyositis, organ involvement unspecified: Secondary | ICD-10-CM | POA: Diagnosis not present

## 2023-08-01 DIAGNOSIS — M3320 Polymyositis, organ involvement unspecified: Secondary | ICD-10-CM | POA: Diagnosis not present

## 2023-08-02 DIAGNOSIS — M3320 Polymyositis, organ involvement unspecified: Secondary | ICD-10-CM | POA: Diagnosis not present

## 2023-08-16 ENCOUNTER — Ambulatory Visit: Payer: BC Managed Care – PPO | Admitting: Internal Medicine

## 2023-08-16 DIAGNOSIS — M359 Systemic involvement of connective tissue, unspecified: Secondary | ICD-10-CM

## 2023-08-16 DIAGNOSIS — J8489 Other specified interstitial pulmonary diseases: Secondary | ICD-10-CM

## 2023-08-16 LAB — PULMONARY FUNCTION TEST
DL/VA % pred: 74 %
DL/VA: 3.22 ml/min/mmHg/L
DLCO cor % pred: 46 %
DLCO cor: 14.45 ml/min/mmHg
DLCO unc % pred: 46 %
DLCO unc: 14.41 ml/min/mmHg
FEF 25-75 Pre: 2.55 L/s
FEF2575-%Pred-Pre: 71 %
FEV1-%Pred-Pre: 63 %
FEV1-Pre: 2.62 L
FEV1FVC-%Pred-Pre: 102 %
FEV6-%Pred-Pre: 63 %
FEV6-Pre: 3.26 L
FEV6FVC-%Pred-Pre: 103 %
FVC-%Pred-Pre: 61 %
FVC-Pre: 3.29 L
Pre FEV1/FVC ratio: 80 %
Pre FEV6/FVC Ratio: 99 %

## 2023-08-16 NOTE — Progress Notes (Signed)
Spirometry/DLCO performed today. 

## 2023-08-16 NOTE — Patient Instructions (Signed)
Spirometry/DLCO performed today. 

## 2023-08-20 ENCOUNTER — Telehealth: Payer: Self-pay

## 2023-08-20 NOTE — Telephone Encounter (Signed)
Sent to Dr.Tabori.

## 2023-08-21 ENCOUNTER — Telehealth: Payer: Self-pay | Admitting: *Deleted

## 2023-08-21 NOTE — Telephone Encounter (Signed)
Patient scheduled 09/24/2023

## 2023-08-21 NOTE — Telephone Encounter (Signed)
Cameron Kim- I see that the pt cancelled his appt, but it still has to be rescheduled   Please reschedule the appt with MR for after he has his CT on 09/18/23  Thank you!

## 2023-08-21 NOTE — Telephone Encounter (Signed)
Spoke with pt and made him aware of these things. Pt reports he has appt with both of them Pt reports he will call us if he needs anything further.

## 2023-08-21 NOTE — Telephone Encounter (Signed)
I am happy to look at his form, but if he is having surgery that requires anesthesia or holding his blood thinner (Eliquis) he will need clearance from Pulmonary and Hematology.

## 2023-08-21 NOTE — Telephone Encounter (Signed)
Pt is scheduled with MR for tomorrow 08/22/23. Has not had his HRCT yet. This is scheduled for 09/18/23. Needs to have this done before he is seen. I called pt to reschedule ov and there was no answer- LMTCB.

## 2023-08-22 ENCOUNTER — Ambulatory Visit: Payer: BC Managed Care – PPO | Admitting: Internal Medicine

## 2023-08-22 ENCOUNTER — Telehealth: Payer: Self-pay | Admitting: Internal Medicine

## 2023-08-22 NOTE — Telephone Encounter (Signed)
Needs surgical clearance faxed to his dentist Fax: [phone]

## 2023-08-23 NOTE — Telephone Encounter (Signed)
Spoke with the pt  He is needing clearance to have tooth extracted  Pt on Eliquis  Please advise, thanks

## 2023-08-27 ENCOUNTER — Other Ambulatory Visit: Payer: Self-pay | Admitting: Nurse Practitioner

## 2023-08-27 DIAGNOSIS — I82432 Acute embolism and thrombosis of left popliteal vein: Secondary | ICD-10-CM

## 2023-08-27 DIAGNOSIS — D72829 Elevated white blood cell count, unspecified: Secondary | ICD-10-CM

## 2023-08-27 NOTE — Progress Notes (Unsigned)
Patient Care Team: Sheliah Hatch, MD as PCP - General (Family Medicine) Runell Gess, MD as PCP - Cardiology (Cardiology) Pollyann Savoy, MD as Consulting Physician (Rheumatology)  Clinic Day:  08/28/2023  Referring physician: Sheliah Hatch, MD  ASSESSMENT & PLAN:   Assessment & Plan: Acute deep vein thrombosis (DVT) of lower extremity (HCC) --First episode in 2013 involved PE and DVT in left lower leg following left ankle fracture and prolonged car travel. --Second episode occurred on 04/17/2021 whcih involved DVT of left popliteal vein and the gastrocnemius veins. --There is no provoking factor that caused recent DVT on 04/17/2021 so recommend indefinite anticoagulation. --Discontinued Xarelto around April/May 2023 due to cost.  --Hypercoagulable workup from 05/02/2021 was unremarkable except for mild elevation of anticardiolipin IgM levels. --continue Eliquis 5 mg twice daily --medication is cost inhibitive.   --RTC in 6 months   Plan: Labs reviewed  -CBC showing WBC 15.2; Hgb 13.4; Hct 42.9; Plt 3.6; Anc  -CMP - K 3.5; glucose 131; BUN 15; Creatinine 1.06; eGFR >60; Ca 9.0; LFTs normal  3 States that he has been doing a bit better since last visit. Was started on Rituximab per pulmonology/rheumatology at [facility]. Diagnosed with Jo-1 positive polymyositis.  Labs and follow up should be scheduled in 6 months.    The patient understands the plans discussed today and is in agreement with them.  He knows to contact our office if he develops concerns prior to his next appointment.  I provided 25 minutes of face-to-face time during this encounter and > 50% was spent counseling as documented under my assessment and plan.    Cameron Jews, NP  [facility] - A DEPT OF [facility] [address] [place] [address] Dept: [phone] Dept Fax: [phone]   No orders of the defined types were  placed in this encounter.     CHIEF COMPLAINT:  CC: recurrent DVT   Current Treatment:  Eliquis 5 mg twice daily   INTERVAL HISTORY:  Cameron Kim is here today for repeat clinical assessment. He was last seen by Dr. Leonides Schanz on 02/27/2023. He has been seen by his pulmonologist and rheumatologist since he was last seen. He was diagnosed with Jo-1 positive polymyositis. He is now receiving IV Rituximab to help manage this. He continues to have moderate fatigue, but does feel like things are improving overall. He denies fevers or chills. He denies pain. His appetite is good. His weight has decreased 5 pounds over last 4 months .  I have reviewed the past medical history, past surgical history, social history and family history with the patient and they are unchanged from previous note.  ALLERGIES:  has No Known Allergies.  MEDICATIONS:  Current Outpatient Medications  Medication Sig Dispense Refill   amLODipine (NORVASC) 5 MG tablet Take 5 mg by mouth daily.     apixaban (ELIQUIS) 5 MG TABS tablet Take 1 tablet (5 mg total) by mouth 2 (two) times daily. 60 tablet 3   Cyanocobalamin (VITAMIN B-12 PO) Take 1 tablet by mouth daily.     FOLIC ACID PO Take 1 tablet by mouth daily.     hydroxychloroquine (PLAQUENIL) 200 MG tablet Take 1 tablet (200 mg total) by mouth 2 (two) times daily. 60 tablet 2   nystatin (MYCOSTATIN/NYSTOP) powder Apply 1 Application topically 2 (two) times daily. 60 g 3   predniSONE (DELTASONE) 20 MG tablet TAKE 2 TABLETS BY MOUTH IN THE MORNING  AND 1 TABLET IN THE EVENING 90 tablet 0   sildenafil (VIAGRA) 100 MG tablet Take 0.5-1 tablets (50-100 mg total) by mouth daily as needed for erectile dysfunction. 5 tablet 11   sulfamethoxazole-trimethoprim (BACTRIM DS) 800-160 MG tablet TAKE 1 TABLET BY MOUTH 3 TIMES A WEEK 36 tablet 0   No current facility-administered medications for this visit.     REVIEW OF SYSTEMS:   Constitutional: Denies fevers, chills or abnormal weight  loss Eyes: Denies blurriness of vision Ears, nose, mouth, throat, and face: Denies mucositis or sore throat Respiratory: Denies cough,. Does hav shortness of breath with mild to moderate exertion.  Cardiovascular: Denies palpitation, chest discomfort or lower extremity swelling Gastrointestinal:  Denies nausea, heartburn or change in bowel habits Skin: Denies abnormal skin rashes Lymphatics: Denies new lymphadenopathy or easy bruising Neurological:Denies numbness, tingling or new weaknesses Behavioral/Psych: Mood is stable, no new changes  All other systems were reviewed with the patient and are negative.   VITALS:   Today's Vitals   08/28/23 0823 08/28/23 0824  BP: (!) 140/72   Pulse: 92   Resp: 18   Temp: 98.7 F (37.1 C)   TempSrc: Tympanic   SpO2: 95%   Weight: 281 lb 9.6 oz (127.7 kg)   Height: 6\' 1"  (1.854 m)   PainSc:  0-No pain   Body mass index is 37.15 kg/m.   Wt Readings from Last 3 Encounters:  08/28/23 281 lb 9.6 oz (127.7 kg)  05/03/23 286 lb 9.6 oz (130 kg)  04/22/23 283 lb (128.4 kg)    Body mass index is 37.15 kg/m.  Performance status (ECOG): 1 - Symptomatic but completely ambulatory  PHYSICAL EXAM:   GENERAL:alert, no distress and comfortable SKIN: skin color, texture, turgor are normal, no rashes or significant lesions EYES: normal, Conjunctiva are pink and non-injected, sclera clear OROPHARYNX:no exudate, no erythema and lips, buccal mucosa, and tongue normal  NECK: supple, thyroid normal size, non-tender, without nodularity LYMPH:  no palpable lymphadenopathy in the cervical, axillary or inguinal LUNGS: clear to auscultation and percussion with normal breathing effort HEART: regular rate & rhythm and no murmurs and no lower extremity edema ABDOMEN:abdomen soft, non-tender and normal bowel sounds Musculoskeletal:no cyanosis of digits and no clubbing  NEURO: alert & oriented x 3 with fluent speech, no focal motor/sensory deficits  LABORATORY  DATA:  I have reviewed the data as listed    Component Value Date/Time   NA 139 08/27/2023 0749   K 3.5 08/27/2023 0749   CL 104 08/27/2023 0749   CO2 26 08/27/2023 0749   GLUCOSE 131 (H) 08/27/2023 0749   BUN 15 08/27/2023 0749   CREATININE 1.06 08/27/2023 0749   CREATININE 0.96 10/18/2022 0904   CALCIUM 9.0 08/27/2023 0749   PROT 7.9 08/27/2023 0749   ALBUMIN 4.1 08/27/2023 0749   AST 15 08/27/2023 0749   ALT 15 08/27/2023 0749   ALKPHOS 61 08/27/2023 0749   BILITOT 0.7 08/27/2023 0749   GFRNONAA >60 08/27/2023 0749   GFRAA >60 06/06/2016 0829    Lab Results  Component Value Date   WBC 15.2 (H) 08/28/2023   NEUTROABS 11.1 (H) 08/28/2023   HGB 13.4 08/28/2023   HCT 42.9 08/28/2023   MCV 98.8 08/28/2023   PLT 306 08/28/2023

## 2023-08-27 NOTE — Assessment & Plan Note (Signed)
--  First episode in 2013 involved PE and DVT in left lower leg following left ankle fracture and prolonged car travel. --Second episode occurred on 04/17/2021 whcih involved DVT of left popliteal vein and the gastrocnemius veins. --There is no provoking factor that caused recent DVT on 04/17/2021 so recommend indefinite anticoagulation. --Discontinued Xarelto around April/May 2023 due to cost.  --Hypercoagulable workup from 05/02/2021 was unremarkable except for mild elevation of anticardiolipin IgM levels. --continue Eliquis 5 mg twice daily --medication is cost inhibitive.   --RTC in 6 months

## 2023-08-28 ENCOUNTER — Encounter: Payer: Self-pay | Admitting: Nurse Practitioner

## 2023-08-28 ENCOUNTER — Other Ambulatory Visit: Payer: Self-pay | Admitting: Hematology and Oncology

## 2023-08-28 ENCOUNTER — Inpatient Hospital Stay: Payer: BC Managed Care – PPO | Attending: Internal Medicine

## 2023-08-28 ENCOUNTER — Inpatient Hospital Stay (HOSPITAL_BASED_OUTPATIENT_CLINIC_OR_DEPARTMENT_OTHER): Payer: BC Managed Care – PPO | Admitting: Nurse Practitioner

## 2023-08-28 VITALS — BP 140/72 | HR 92 | Temp 98.7°F | Resp 18 | Ht 73.0 in | Wt 281.6 lb

## 2023-08-28 DIAGNOSIS — I82432 Acute embolism and thrombosis of left popliteal vein: Secondary | ICD-10-CM

## 2023-08-28 DIAGNOSIS — M3320 Polymyositis, organ involvement unspecified: Secondary | ICD-10-CM | POA: Insufficient documentation

## 2023-08-28 DIAGNOSIS — Z86711 Personal history of pulmonary embolism: Secondary | ICD-10-CM | POA: Insufficient documentation

## 2023-08-28 DIAGNOSIS — Z8639 Personal history of other endocrine, nutritional and metabolic disease: Secondary | ICD-10-CM | POA: Insufficient documentation

## 2023-08-28 DIAGNOSIS — Z862 Personal history of diseases of the blood and blood-forming organs and certain disorders involving the immune mechanism: Secondary | ICD-10-CM | POA: Diagnosis not present

## 2023-08-28 DIAGNOSIS — Z79899 Other long term (current) drug therapy: Secondary | ICD-10-CM | POA: Insufficient documentation

## 2023-08-28 DIAGNOSIS — R5383 Other fatigue: Secondary | ICD-10-CM | POA: Diagnosis not present

## 2023-08-28 DIAGNOSIS — Z86718 Personal history of other venous thrombosis and embolism: Secondary | ICD-10-CM | POA: Diagnosis not present

## 2023-08-28 DIAGNOSIS — Z7901 Long term (current) use of anticoagulants: Secondary | ICD-10-CM | POA: Diagnosis not present

## 2023-08-28 LAB — IRON AND IRON BINDING CAPACITY (CC-WL,HP ONLY)
Iron: 45 ug/dL (ref 45–182)
Saturation Ratios: 12 % — ABNORMAL LOW (ref 17.9–39.5)
TIBC: 377 ug/dL (ref 250–450)
UIBC: 332 ug/dL (ref 117–376)

## 2023-08-28 LAB — CBC WITH DIFFERENTIAL (CANCER CENTER ONLY)
Abs Immature Granulocytes: 0.1 10*3/uL — ABNORMAL HIGH (ref 0.00–0.07)
Basophils Absolute: 0.1 10*3/uL (ref 0.0–0.1)
Basophils Relative: 1 %
Eosinophils Absolute: 0.5 10*3/uL (ref 0.0–0.5)
Eosinophils Relative: 3 %
HCT: 42.9 % (ref 39.0–52.0)
Hemoglobin: 13.4 g/dL (ref 13.0–17.0)
Immature Granulocytes: 1 %
Lymphocytes Relative: 16 %
Lymphs Abs: 2.4 10*3/uL (ref 0.7–4.0)
MCH: 30.9 pg (ref 26.0–34.0)
MCHC: 31.2 g/dL (ref 30.0–36.0)
MCV: 98.8 fL (ref 80.0–100.0)
Monocytes Absolute: 1 10*3/uL (ref 0.1–1.0)
Monocytes Relative: 7 %
Neutro Abs: 11.1 10*3/uL — ABNORMAL HIGH (ref 1.7–7.7)
Neutrophils Relative %: 72 %
Platelet Count: 306 10*3/uL (ref 150–400)
RBC: 4.34 MIL/uL (ref 4.22–5.81)
RDW: 17.1 % — ABNORMAL HIGH (ref 11.5–15.5)
Smear Review: NORMAL
WBC Count: 15.2 10*3/uL — ABNORMAL HIGH (ref 4.0–10.5)
nRBC: 0 % (ref 0.0–0.2)

## 2023-08-28 LAB — CMP (CANCER CENTER ONLY)
ALT: 15 U/L (ref 0–44)
AST: 15 U/L (ref 15–41)
Albumin: 4.1 g/dL (ref 3.5–5.0)
Alkaline Phosphatase: 61 U/L (ref 38–126)
Anion gap: 9 (ref 5–15)
BUN: 15 mg/dL (ref 6–20)
CO2: 26 mmol/L (ref 22–32)
Calcium: 9 mg/dL (ref 8.9–10.3)
Chloride: 104 mmol/L (ref 98–111)
Creatinine: 1.06 mg/dL (ref 0.61–1.24)
GFR, Estimated: >60 mL/min (ref >60)
Glucose, Bld: 131 mg/dL — ABNORMAL HIGH (ref 70–99)
Potassium: 3.5 mmol/L (ref 3.5–5.1)
Sodium: 139 mmol/L (ref 135–145)
Total Bilirubin: 0.7 mg/dL (ref <1.2)
Total Protein: 7.9 g/dL (ref 6.5–8.1)

## 2023-08-28 LAB — RETIC PANEL
Immature Retic Fract: 20.4 % — ABNORMAL HIGH (ref 2.3–15.9)
RBC.: 4.38 MIL/uL (ref 4.22–5.81)
Retic Count, Absolute: 119.1 10*3/uL (ref 19.0–186.0)
Retic Ct Pct: 2.7 % (ref 0.4–3.1)
Reticulocyte Hemoglobin: 32.9 pg (ref >27.9)

## 2023-08-28 LAB — FERRITIN: Ferritin: 241 ng/mL (ref 24–336)

## 2023-08-28 LAB — VITAMIN B12: Vitamin B-12: 339 pg/mL (ref 180–914)

## 2023-08-29 DIAGNOSIS — M3320 Polymyositis, organ involvement unspecified: Secondary | ICD-10-CM | POA: Diagnosis not present

## 2023-08-30 DIAGNOSIS — M3320 Polymyositis, organ involvement unspecified: Secondary | ICD-10-CM | POA: Diagnosis not present

## 2023-08-30 LAB — METHYLMALONIC ACID, SERUM: Methylmalonic Acid, Quantitative: 191 nmol/L (ref 0–378)

## 2023-09-09 ENCOUNTER — Telehealth: Payer: Self-pay | Admitting: Family Medicine

## 2023-09-09 NOTE — Telephone Encounter (Signed)
Type of form received: Surgical Clearance   Additional comments: [facility] - [place]   Received TF:[id number] - Front Desk   Form should be Faxed/mailed to: (address/ fax #) [phone]  Is patient requesting call for pickup:N/A  Form placed:  In front bin   Attach charge sheet.  Provider will determine charge.N/A  Individual made aware of 3-5 business day turn around Yes?

## 2023-09-09 NOTE — Telephone Encounter (Signed)
Called left vm. To call office about this surgical clearance

## 2023-09-09 NOTE — Telephone Encounter (Signed)
Pt will need clearance from Hematology (regarding his use of blood thinners) and Pulmonary (if anesthesia is needed)

## 2023-09-10 NOTE — Telephone Encounter (Signed)
Copy of this note from [facility] to [facility] at the fax number they provided.

## 2023-09-10 NOTE — Telephone Encounter (Addendum)
error 

## 2023-09-10 NOTE — Telephone Encounter (Signed)
Pt reports he has reached out to Rheumatology and Pulmonary for clearance . He will call us to make appt if he needs Dr.Tabori to sign off per his dentist. He does not think this has to be with PCP. Pt was told if he does need this appt to call up for surgical clearance appt.

## 2023-09-10 NOTE — Telephone Encounter (Signed)
Patient checking on surgical clearance. Patient having surgery 09/11/2023. Patient phone number is [phone].

## 2023-09-10 NOTE — Telephone Encounter (Signed)
Fax number for [facility] [phone]. Patient phone number is [phone].

## 2023-09-10 NOTE — Telephone Encounter (Signed)
He just saw hematology oncology nurse practitioner on 08/28/2023 and they are the ones who are recommending him continue with the Eliquis.  And even when I saw him for the first time in September 2023 he was already on Eliquis.  In between in July 2024 he had a blood clot while on Eliquis which I diagnosed and then referred him to hematology and that is when he saw hematology just 2 weeks ago they told him to continue the Eliquis  The decision to hold Eliquis for surgery therefore asked to come from whoever is prescribing his Eliquis for the blood clot which is oncology but typical duration to hold the Eliquis is 48 hours.  I personally do not see any contraindication for him to hold the Eliquis for 48 hours and undergo tooth extraction so from my perspective it is okay to hold Eliquis for 48 hours and undergo tooth extraction

## 2023-09-18 ENCOUNTER — Ambulatory Visit (HOSPITAL_BASED_OUTPATIENT_CLINIC_OR_DEPARTMENT_OTHER)
Admission: RE | Admit: 2023-09-18 | Discharge: 2023-09-18 | Disposition: A | Discharge status: Home / Self Care | Payer: BC Managed Care – PPO | Source: Ambulatory Visit | Attending: Internal Medicine | Admitting: Internal Medicine

## 2023-09-18 DIAGNOSIS — M359 Systemic involvement of connective tissue, unspecified: Secondary | ICD-10-CM | POA: Diagnosis not present

## 2023-09-18 DIAGNOSIS — R918 Other nonspecific abnormal finding of lung field: Secondary | ICD-10-CM | POA: Diagnosis not present

## 2023-09-18 DIAGNOSIS — J8489 Other specified interstitial pulmonary diseases: Secondary | ICD-10-CM | POA: Insufficient documentation

## 2023-09-18 DIAGNOSIS — E041 Nontoxic single thyroid nodule: Secondary | ICD-10-CM | POA: Diagnosis not present

## 2023-09-18 DIAGNOSIS — J849 Interstitial pulmonary disease, unspecified: Secondary | ICD-10-CM | POA: Diagnosis not present

## 2023-09-19 ENCOUNTER — Ambulatory Visit: Payer: BC Managed Care – PPO | Admitting: Family Medicine

## 2023-09-20 ENCOUNTER — Encounter: Payer: Self-pay | Admitting: Family Medicine

## 2023-09-20 ENCOUNTER — Ambulatory Visit: Payer: BC Managed Care – PPO | Admitting: Family Medicine

## 2023-09-20 ENCOUNTER — Other Ambulatory Visit: Payer: BC Managed Care – PPO

## 2023-09-20 VITALS — BP 110/70 | HR 103 | Temp 97.9°F | Ht 73.0 in | Wt 273.5 lb

## 2023-09-20 DIAGNOSIS — E669 Obesity, unspecified: Secondary | ICD-10-CM | POA: Diagnosis not present

## 2023-09-20 DIAGNOSIS — N529 Male erectile dysfunction, unspecified: Secondary | ICD-10-CM | POA: Diagnosis not present

## 2023-09-20 DIAGNOSIS — I10 Essential (primary) hypertension: Secondary | ICD-10-CM

## 2023-09-20 MED ORDER — TADALAFIL 20 MG PO TABS: 10.0000 mg | ORAL_TABLET | ORAL | 11 refills | Status: DC | PRN

## 2023-09-20 NOTE — Progress Notes (Unsigned)
   Subjective:    Patient ID: Cameron Kim, male    DOB: Nov 02, 1970, 52 y.o.   MRN: [id number]  HPI HTN- chronic problem, on Amlodipine 5mg  daily w/ good control.  No CP, SOB above baseline, HA's, visual changes, edema.  Obesity- pt's weight is now 273 (down 10 lbs since July).  Pt gained quite a bit of weight due to steroid use- is planning to stop Prednisone on 12/23.  Is now walking dog 4x/day.    ED- pt reports that 100mg  sildenafil is not working.  Wondering if there is something to be done.  Review of Systems For ROS see HPI     Objective:   Physical Exam Vitals reviewed.  Constitutional:      General: He is not in acute distress.    Appearance: Normal appearance. He is well-developed. He is obese. He is not ill-appearing.  HENT:     Head: Normocephalic and atraumatic.  Eyes:     Extraocular Movements: Extraocular movements intact.     Conjunctiva/sclera: Conjunctivae normal.     Pupils: Pupils are equal, round, and reactive to light.  Neck:     Thyroid: No thyromegaly.  Cardiovascular:     Rate and Rhythm: Normal rate and regular rhythm.     Pulses: Normal pulses.     Heart sounds: Normal heart sounds. No murmur heard. Pulmonary:     Effort: Pulmonary effort is normal. No respiratory distress.     Breath sounds: Normal breath sounds.  Abdominal:     General: Bowel sounds are normal. There is no distension.     Palpations: Abdomen is soft.  Musculoskeletal:     Cervical back: Normal range of motion and neck supple.     Right lower leg: No edema.     Left lower leg: No edema.  Lymphadenopathy:     Cervical: No cervical adenopathy.  Skin:    General: Skin is warm and dry.  Neurological:     General: No focal deficit present.     Mental Status: He is alert and oriented to person, place, and time.     Cranial Nerves: No cranial nerve deficit.  Psychiatric:        Mood and Affect: Mood normal.        Behavior: Behavior normal.          Assessment & Plan:

## 2023-09-20 NOTE — Patient Instructions (Addendum)
Schedule your complete physical in 6 months Schedule a lab visit at your convenience Continue to work on healthy diet and regular exercise- you're doing great! Call with any questions or concerns Stay Safe!  Stay Healthy! Happy Holidays!!

## 2023-09-21 NOTE — Assessment & Plan Note (Signed)
Improving.  Pt is down 10 lbs since July.  He has been weaning prednisone and plan is to stop Prednisone on 12/23.  He is now walking his dog 4x/day.  Applauded his efforts at regular physical activity and encouraged him to focus on low carb diet.  Will follow.

## 2023-09-21 NOTE — Assessment & Plan Note (Signed)
Chronic problem. On Amlodipine 5mg  daily w/ good control.  Currently asymptomatic.  No med changes at this time

## 2023-09-21 NOTE — Assessment & Plan Note (Signed)
Pt reports Viagra is no longer effective.  Will try switching to Cialis- pending insurance approval- and see if sxs improve.  Pt expressed understanding and is in agreement w/ plan.

## 2023-09-23 ENCOUNTER — Other Ambulatory Visit (INDEPENDENT_AMBULATORY_CARE_PROVIDER_SITE_OTHER): Payer: BC Managed Care – PPO

## 2023-09-23 DIAGNOSIS — E669 Obesity, unspecified: Secondary | ICD-10-CM | POA: Diagnosis not present

## 2023-09-23 DIAGNOSIS — I10 Essential (primary) hypertension: Secondary | ICD-10-CM

## 2023-09-23 LAB — CBC WITH DIFFERENTIAL/PLATELET
Basophils Absolute: 0.1 10*3/uL (ref 0.0–0.1)
Basophils Relative: 0.4 % (ref 0.0–3.0)
Eosinophils Absolute: 0 10*3/uL (ref 0.0–0.7)
Eosinophils Relative: 0 % (ref 0.0–5.0)
HCT: 42.7 % (ref 39.0–52.0)
Hemoglobin: 13.5 g/dL (ref 13.0–17.0)
Lymphocytes Relative: 5.9 % — ABNORMAL LOW (ref 12.0–46.0)
Lymphs Abs: 0.9 10*3/uL (ref 0.7–4.0)
MCHC: 31.7 g/dL (ref 30.0–36.0)
MCV: 96.8 fL (ref 78.0–100.0)
Monocytes Absolute: 0.6 10*3/uL (ref 0.1–1.0)
Monocytes Relative: 4.1 % (ref 3.0–12.0)
Neutro Abs: 13.7 10*3/uL — ABNORMAL HIGH (ref 1.4–7.7)
Neutrophils Relative %: 89.6 % — ABNORMAL HIGH (ref 43.0–77.0)
Platelets: 315 10*3/uL (ref 150.0–400.0)
RBC: 4.41 Mil/uL (ref 4.22–5.81)
RDW: 17.4 % — ABNORMAL HIGH (ref 11.5–15.5)
WBC: 15.3 10*3/uL — ABNORMAL HIGH (ref 4.0–10.5)

## 2023-09-23 LAB — LIPID PANEL
Cholesterol: 186 mg/dL (ref 0–200)
HDL: 52.9 mg/dL (ref >39.00)
LDL Cholesterol: 111 mg/dL — ABNORMAL HIGH (ref 0–99)
NonHDL: 133.14
Total CHOL/HDL Ratio: 4
Triglycerides: 109 mg/dL (ref 0.0–149.0)
VLDL: 21.8 mg/dL (ref 0.0–40.0)

## 2023-09-23 LAB — BASIC METABOLIC PANEL
BUN: 17 mg/dL (ref 6–23)
CO2: 27 meq/L (ref 19–32)
Calcium: 9 mg/dL (ref 8.4–10.5)
Chloride: 102 meq/L (ref 96–112)
Creatinine, Ser: 0.92 mg/dL (ref 0.40–1.50)
GFR: 95.37 mL/min (ref >60.00)
Glucose, Bld: 90 mg/dL (ref 70–99)
Potassium: 4 meq/L (ref 3.5–5.1)
Sodium: 139 meq/L (ref 135–145)

## 2023-09-23 LAB — HEPATIC FUNCTION PANEL
ALT: 17 U/L (ref 0–53)
AST: 16 U/L (ref 0–37)
Albumin: 4.1 g/dL (ref 3.5–5.2)
Alkaline Phosphatase: 60 U/L (ref 39–117)
Bilirubin, Direct: 0.1 mg/dL (ref 0.0–0.3)
Total Bilirubin: 0.6 mg/dL (ref 0.2–1.2)
Total Protein: 7.5 g/dL (ref 6.0–8.3)

## 2023-09-23 LAB — TSH: TSH: 0.68 u[IU]/mL (ref 0.35–5.50)

## 2023-09-23 NOTE — Progress Notes (Signed)
OV 06/26/2022  Subjective:  Patient ID: Cameron Kim, male , DOB: [birth year] , age 52 y.o. , MRN: [id number] , ADDRESS: [address] [place] [address] PCP Sheliah Hatch, MD Patient Care Team: Sheliah Hatch, MD as PCP - General (Family Medicine) Runell Gess, MD as PCP - Cardiology (Cardiology)  This Provider for this visit: Treatment Team:  Attending Provider: Kalman Shan, MD    06/26/2022 -   Chief Complaint  Patient presents with   Consult    Wheezing/ cough      HPI Cameron Kim 52 y.o. -referral from the rheumatology Dr. Corliss Skains  who he saw for the first time on 05/31/2022.  He has been diagnosed with elevated CK and Jo 1 positive polymyositis.  He used to work in [employer].  He has had COVID booster mRNA 3 times.  He had COVID in November 2022 and sometime after that started developing symptoms of weakness of the proximal muscles.  This also included dysphagia, dysphonia and dysarthria after he saw rheumatology on 05/31/2022 he was placed on prednisone 40 mg twice daily.  After this is significantly improved.  His results were elevated CK which is improving.  Jo 1 antibody positive positive ANA.  QuantiFERON gold indeterminate.  He has not been referred to [facility] where he has an upcoming appointment.  In the last few to several months he is also noticing cough with some wheezing.  He is also choking on saliva.  There is no shortness of breath per se but then he is weak and is not able to walk too much.  He is not disabled from his work.  He did have a CT scan of the abdomen 06/08/2022 in the lung images do show abnormalities.  However walking desaturation test today with this is normal.  He does not have echocardiogram.  He does not have pulmonary function test.  There is no high-resolution CT chest.    [facility] consult 9/122/23 Dr Steva Colder      No data to display         Chief Complaint  Patient presents with   Polymyositis   Cameron Kim is a 52 y.o. male who presents for evaluation of longstanding anti-Jo-1 antibody positive inflammatory myositis. The patient is accompanied by his wife, [name].  Beginning in 08/2021, the patient noticed decreased strength which worsened to the point he was unable to move or sit up in bed. He saw his primary care provider in 02/2022 and has seen multiple physicians since. He recently saw a rheumatologist locally who diagnosed him with polymyositis and referred him here to [facility] [name] also indicates he has difficulty swallowing and his voice has changed. He notes a weight loss of 48 pounds in 9 months. He also mentions he had COVID-19 around the same time. The patient endorses a rash which he feels looks like ringworm with extreme itchiness and flakiness.   The patient has a history of a torn rotator cuff but was denied surgery due to a protein abnormality. Once he was provided steroids on 06/01/2022, the patient improved overall. He continues to have stiffness which impacts his lifestyle.   The patient has had numerous tests including DEXA scan, upper abdominal exam, and is scheduled for a biopsy next week. He has noticed increased moodiness and appetite with prednisone. He also indicates he has insomnia. His prednisone taper was started at 80 mg and currently he continues on 60 mg daily.  The only joint pain [name] notes currently is hand pain with inability to fully close his hands. Prior to prednisone, he had all over joint pain.   Family History: Father diagnosed with ALS. Social History: He worked in [employer]. Past medical history:  - Blood clots in 2013 and 2022, currently on blood thinners.    A/P  Cameron Kim is a 52 y.o. male with likely an overlap disease with anti-Jo-1 antibody, Ro antibodies, rheumatoid factor, and CCP positivity.   He has signs and symptoms classic for dermatomyositis with history of heliotropic rash, mechanics hands,  Gottron's papules, proximal muscle weakness, and inflammatory arthritis, currently on prednisone 60 mg. He is planning to get tapered with his local rheumatologist down to 50 mg in the next few weeks.   He is scheduled for a muscle biopsy on Monday, and he will plan to start CellCept on Tuesday after he undergoes the muscle biopsy. We discussed the risks and benefits of CellCept, azathioprine, methotrexate, IVIG and for now the goal will be to start the CellCept and titrate it up slowly until he can tolerate 1500 mg twice daily.  1. Jo-1 positive polymyositis (CMS-HCC) 2. High risk medication use 3. Seropositive RA - mycophenolate (CELLCEPT) 500 mg tablet; Take 1 tablet (500 mg total) by mouth 2 (two) times daily for 14 days, THEN 2 tablets (1,000 mg total) 2 (two) times daily for 14 days, THEN 3 tablets (1,500 mg total) 2 (two) times daily for 90 days. - continue prednisone 60 mg for now, follow taper as suggested by local rheumatologist   OV 08/28/2022  Subjective:  Patient ID: Cameron Kim, male , DOB: [birth year] , age 52 y.o. , MRN: [id number] , ADDRESS: [address] [place] [address] PCP Sheliah Hatch, MD Patient Care Team: Sheliah Hatch, MD as PCP - General (Family Medicine) Runell Gess, MD as PCP - Cardiology (Cardiology) Pollyann Savoy, MD as Consulting Physician (Rheumatology)  This Provider for this visit: Treatment Team:  Attending Provider: Kalman Shan, MD    08/28/2022 -   Chief Complaint  Patient presents with   Follow-up    Pt states he has been doing okay since last visit.     HPI Cameron Kim 52 y.o. -returns for follow-up.  Since last seeing me he has seen Dr. Steva Colder at [facility] rheumatology.  Her notes were reviewed and copied and pasted above.  She is diagnosed with combination of dermatomyositis/polymyositis with seropositive rheumatoid arthritis.  She started him on CellCept along with Bactrim.  High-dose  prednisone which is being tapered now by local rheumatologist Dr Alyce Pagan.  He is beginning to feel better.  His muscle strength is improved.  He did have interstitial lung disease workup.  He had an echocardiogram that shows enlarged pulmonary arteries with suggestion of pulmonary hypertension.  He also had high-resolution d discuss the implications of the findings.  He recollects a left heart catheterization a few years ago at [facility] when he was incarcerated.  But it does not sound like he has had a right heart catheterization.   His current symptoms are as below   No results found for: "NITRICOXIDE"  Latest Reference Range & Units 05/31/22 11:02 06/19/22 16:30 07/31/22 08:40  CK Total 44 - 196 U/L 3,806 (H) 475 (H) 265 (H)     OV 11/06/2022  Subjective:  Patient ID: Cameron Kim, male , DOB: 10-16-1970 , age 45 y.o. , MRN: [id number] , ADDRESS: [address] [place] [address] PCP Sheliah Hatch, MD Patient Care Team: Sheliah Hatch, MD as PCP - General (Family Medicine) Runell Gess, MD as PCP - Cardiology (Cardiology) Pollyann Savoy, MD as Consulting Physician (Rheumatology)  This Provider for this visit: Treatment Team:  Attending Provider: Kalman Shan, MD    11/06/2022 -   Chief Complaint  Patient presents with   Follow-up    PFT done today. Breathing is unchanged since the last visit.      HPI Geovannie Kim 52 y.o. -he is here for follow-up.  After the last visit I emailed [facility] rheumatology Dr. Steva Colder and she emailed thought that 2 doses of Rituxan could be potential benefit for him in stabilizing his ILD.  He did see the other local [facility] rheumatology PA on 10/18/2022.  Was noted that he was taking Plaquenil, CellCept 100 mg 3 tablets by mouth twice daily along with Bactrim and prednisone 30 mg.  It was noted that he did not have any side effects..  He reported some arthralgia.  And also continued intermittent  rashes as well as increased frequency of Raynaud's.  However CK was elevated to 1100 [personally reviewed] [facility] rheumatology preferred that [facility] rheumatology take over his care.  On his visit to [facility] rheumatology Dr. Steva Colder on 10/29/2022 he noticed that his skin was worsening and experiencing dryness.  There was some suspicion of a hand infection and he did get antibiotic doxycycline prior to that visit.  Rheumatology at [facility] felt that his disease was not in remission.  She gave him handout on methotrexate.  Low-dose of amlodipine for the worsening Raynaud's was prescribed.  She also decided to start IVIG because of elevations in CK despite CellCept and Plaquenil and high-dose prednisone.  She personally emailed me today saying that current plan is for him to get IVIG at [facility]  1 mg/kg x2 days at ideal weight, which made the dose 80 mg, x2.  In the work she would consider IV Rituxan.    Today he tells me that he is now taking prednisone 40 mg/day since approximately 10/14/2022 since his CK was elevated.  Also for the last week or so since seeing [facility] rheumatology he is on amlodipine.  So far he is not sure things have improved particularly in the foot over the Raynaud's.  He is open to getting CK checked  Of note he did have right heart catheterization by Dr. Marca Ancona in December 2023.  He does have pulmonary artery pressures bordeline elevated but his pulmonary vascular resistance is less than 3. PCWP 12, MAP 25 and PVR < 2  From a respiratory standpoint he is stable and walking desaturation test is stable and pulmonary function test is stable records suggesting that his ILD is actually still stable.  Although given the elevated CK he is at risk of progression in the future.   OV 03/11/2023  Subjective:  Patient ID: Cameron Kim, male , DOB: May 15, 1971 , age 69 y.o. , MRN: [id number] , ADDRESS: [address] [place] [address] PCP Sheliah Hatch,  MD Patient Care Team: Sheliah Hatch, MD as PCP - General (Family Medicine) Runell Gess, MD as PCP - Cardiology (Cardiology) Pollyann Savoy, MD as Consulting Physician (Rheumatology)  This Provider for this visit: Treatment Team:  Attending Provider: Kalman Shan, MD    03/11/2023 -  No chief complaint on file.    HPI Cameron Kim 52 y.o. -  OV 05/03/2023  Subjective:  Patient ID: Cameron Kim, male , DOB: [birth year] , age 89 y.o. , MRN: [id number] , ADDRESS: [address] [place] [address] PCP Sheliah Hatch, MD Patient Care Team: Sheliah Hatch, MD as PCP - General (Family Medicine) Runell Gess, MD as PCP - Cardiology (Cardiology) Pollyann Savoy, MD as Consulting Physician (Rheumatology)  This Provider for this visit: Treatment Team:  Attending Provider: Kalman Shan, MD    05/03/2023 -   Chief Complaint  Patient presents with   Follow-up    F/up on PFT      HPI Clerance Kim 52 y.o. -returns for follow-up.  He missed his appointment with me in June 2024 because of multiple other appointments.  Last seen January 2024.  Since then he is continue to CellCept daily prednisone, Plaquenil and Bactrim for PCP prophylaxis.  Based on his history and review of the external medical records from [facility] he did get IVIG and also recently Rituxan [see above].  He believes his muscle strength is improved.  In fact he was able to do sit/stand test 15 times easily without any issues.  His last CK that they have either with myself or at [facility] is in March 2024 and it shows improvement but still elevated [see below].  At [facility] he did have a CRP that was still elevated.  Despite all this he is continuing to experience increasing shortness of breath in the last few months.  Of note he had 100 pound weight gain in the last 1 year.  He did show a tendency to desaturate with a sit/stand test today  although it was very transient to 87%.  And he was only mildly dyspneic.  Nevertheless he is worried about disease progression.    OV 09/24/2023  Subjective:  Patient ID: Cameron Kim, male , DOB: 04-Apr-1971 , age 69 y.o. , MRN: [id number] , ADDRESS: [address] [place] [address] PCP Sheliah Hatch, MD Patient Care Team: Sheliah Hatch, MD as PCP - General (Family Medicine) Runell Gess, MD as PCP - Cardiology (Cardiology) Pollyann Savoy, MD as Consulting Physician (Rheumatology)  This Provider for this visit: Treatment Team:  Attending Provider: Kalman Shan, MD   polymyositis and probable UIP/ILD on CT scan last CT scan September 2023.   treatment plan of Dr. Steva Colder at [facility]  -CellCept  -Daily prednisone 40 mg/day  - bactrim,  - plaqueni  -Status post cycle 1 Rituxan 05/02/2023 and every 6 months after that  -IVIG per history given end of June 2024 and then second cycle early May 10, 2023   No PE Aug 2024  09/24/2023 -   Chief Complaint  Patient presents with   Follow-up    Ct f/u, pt states breathing has been okay      HPI Cameron Kim 53 y.o. -presents for follow-up.  At the last visit we ruled out pulmonary embolism.  Currently is feeling stable.  He continues his immunomodulatory treatment.  His strength is better he is able to do a lot more squats.  His rheumatologist planning to wean him off prednisone.  I reviewed the external medical record he continues to get IVIG infusions but it has been a while since he saw Dr. Seymour Bars.  His symptom score is stable.  Pulmonary function test the FVC is improved but the DLCO is stable.  He had a high-resolution CT chest.  Official report is pending  because of radiology shortage.  But my personal visualization of the ILD is the same/slightly better.  His exercise hypoxemia test shows slight desaturation but it is also stable.  We did overnight pulse oximetry.  He says he has done  it 2 times but I do not have the results.  Will try to get the old results.  I offered him flu shot but he declined.  He does not want to have the flu shot and RSV shot.  He says people get sick after getting it.  I did warn him about "slow COVID" in the setting of Rituxan and to take antivirals if he ever gets COVID.  He had labs yesterday and is normal.  He is willing to get CRP and total CK today.  He did 2 overnight pulse oximetry is in the summer 2024 but both were abnormal.  We did not get both results.  I have to order 1 now.      SYMPTOM SCALE - ILD 08/28/2022 11/06/2022  05/03/2023  09/24/2023   Current weight  Cellcept , pred, plaquenil, bactrim CellCept, prednisone, Plaquenil, Bactrim IVIG June 2024 and Rituxan July 2024  130 kg CellCept, prednisone, Plaquenil, Bactrim, IVIG and Rituxan  He will come off prednisone as of September 29, 2023 room air  O2 use ra  ra   Shortness of Breath 0 -> 5 scale with 5 being worst (score 6 If unable to do)     At rest 0 0 0 0  Simple tasks - showers, clothes change, eating, shaving 2 0 1 1  Household (dishes, doing bed, laundry) 2 2 3    Shopping 2 0 2   Walking level at own pace 2 1 3    Walking up Stairs 3 1 4 3   Total (30-36) Dyspnea Score 11 4 13    How bad is your cough? 0 0 0 1  How bad is your fatigue 3 1 1 2   How bad is nausea 2 0 0 0  How bad is vomiting?  0 0 0 0  How bad is diarrhea? 3 0 0 0  How bad is anxiety? 2 1 3  0  How bad is depression 0 0 0 0  Any chronic pain - if so where and how bad x   00       Simple office walk 185 feet x  3 laps goal with forehead probe 06/26/2022  11/06/2022  05/03/2023  09/24/2023   O2 used ra  ra ra  Number laps completed 3  Sit stand x 1 5 Sit stand x 15  Comments about pace x     Resting Pulse Ox/HR 99% and 85/min 100% 96%-97% and HR 75 96% and HR 85  Final Pulse Ox/HR 97% and 105/min 96% 87%   92$ and HR 105 87% and HR 1010  Desaturated </= 88% no     Desaturated <= 3% points no 4  points yes   Got Tachycardic >/= 90/min yes  yes   Symptoms at end of test x  1 of 10 dyspnea   Miscellaneous comments x       CT Chest data from date: *09/1123  - personally visualized and independently interpreted : YES, results official NA - my findings are: -Stable interstitial lung disease versus slightly better.   PFT     Latest Ref Rng & Units 08/16/2023   10:00 AM 03/01/2023   10:59 AM 11/06/2022   12:07 PM 08/10/2022    8:52 AM  ILD  indicators  FVC-Pre L 3.29  2.89  3.03  2.89   FVC-Predicted Pre % 61  54  56  53   FVC-Post L    2.95   FVC-Predicted Post %    54   TLC L    4.20   TLC Predicted %    56   DLCO uncorrected ml/min/mmHg 14.41  15.97  15.74  14.99   DLCO UNC %Pred % 46  51  51  48   DLCO Corrected ml/min/mmHg 14.45  15.93  15.49  14.87   DLCO COR %Pred % 46  51  50  47       LAB RESULTS last 96 hours No results found.  LAB RESULTS last 90 days Recent Results (from the past 2160 hours)  Pulmonary function test     Status: None   Collection Time: 08/16/23 10:00 AM  Result Value Ref Range   FVC-Pre 3.29 L   FVC-%Pred-Pre 61 %   FEV1-Pre 2.62 L   FEV1-%Pred-Pre 63 %   FEV6-Pre 3.26 L   FEV6-%Pred-Pre 63 %   Pre FEV1/FVC ratio 80 %   FEV1FVC-%Pred-Pre 102 %   Pre FEV6/FVC Ratio 99 %   FEV6FVC-%Pred-Pre 103 %   FEF 25-75 Pre 2.55 L/sec   FEF2575-%Pred-Pre 71 %   DLCO unc 14.41 ml/min/mmHg   DLCO unc % pred 46 %   DLCO cor 14.45 ml/min/mmHg   DLCO cor % pred 46 %   DL/VA 1.61 ml/min/mmHg/L   DL/VA % pred 74 %  CMP (Cancer Center only)     Status: Abnormal   Collection Time: 08/27/23  7:49 AM  Result Value Ref Range   Sodium 139 135 - 145 mmol/L   Potassium 3.5 3.5 - 5.1 mmol/L   Chloride 104 98 - 111 mmol/L   CO2 26 22 - 32 mmol/L   Glucose, Bld 131 (H) 70 - 99 mg/dL    Comment: Glucose reference range applies only to samples taken after fasting for at least 8 hours.   BUN 15 6 - 20 mg/dL   Creatinine 0.96 0.45 - 1.24 mg/dL   Calcium 9.0  8.9 - 40.9 mg/dL   Total Protein 7.9 6.5 - 8.1 g/dL   Albumin 4.1 3.5 - 5.0 g/dL   AST 15 15 - 41 U/L   ALT 15 0 - 44 U/L   Alkaline Phosphatase 61 38 - 126 U/L   Total Bilirubin 0.7 <1.2 mg/dL   GFR, Estimated >81 >19 mL/min    Comment: (NOTE) Calculated using the CKD-EPI Creatinine Equation (2021)    Anion gap 9 5 - 15    Comment: Performed at [facility], [address], [place], [place] [address]  Vitamin B12     Status: None   Collection Time: 08/28/23  7:49 AM  Result Value Ref Range   Vitamin B-12 339 180 - 914 pg/mL    Comment: (NOTE) This assay is not validated for testing neonatal or myeloproliferative syndrome specimens for Vitamin B12 levels. Performed at [facility], [address] [place], [place] [address]   CBC with Differential (Cancer Center Only)     Status: Abnormal   Collection Time: 08/28/23  7:49 AM  Result Value Ref Range   WBC Count 15.2 (H) 4.0 - 10.5 K/uL   RBC 4.34 4.22 - 5.81 MIL/uL   Hemoglobin 13.4 13.0 - 17.0 g/dL   HCT 30.8 65.7 - 84.6 %   MCV 98.8 80.0 - 100.0  fL   MCH 30.9 26.0 - 34.0 pg   MCHC 31.2 30.0 - 36.0 g/dL   RDW 45.4 (H) 09.8 - 11.9 %   Platelet Count 306 150 - 400 K/uL   nRBC 0.0 0.0 - 0.2 %   Neutrophils Relative % 72 %   Neutro Abs 11.1 (H) 1.7 - 7.7 K/uL   Lymphocytes Relative 16 %   Lymphs Abs 2.4 0.7 - 4.0 K/uL   Monocytes Relative 7 %   Monocytes Absolute 1.0 0.1 - 1.0 K/uL   Eosinophils Relative 3 %   Eosinophils Absolute 0.5 0.0 - 0.5 K/uL   Basophils Relative 1 %   Basophils Absolute 0.1 0.0 - 0.1 K/uL   WBC Morphology MORPHOLOGY UNREMARKABLE    RBC Morphology MORPHOLOGY UNREMARKABLE    Smear Review Normal platelet morphology    Immature Granulocytes 1 %   Abs Immature Granulocytes 0.10 (H) 0.00 - 0.07 K/uL    Comment: Performed at [facility], [address], [place], [place] [address]  Methylmalonic acid, serum     Status: None    Collection Time: 08/28/23  7:49 AM  Result Value Ref Range   Methylmalonic Acid, Quantitative 191 0 - 378 nmol/L    Comment: (NOTE) This test was developed and its performance characteristics determined by [facility]. It has not been cleared or approved by the Food and Drug Administration. Performed At: [facility] [address] [place], [place] [id number] Jolene Schimke MD VH:[id number]   Retic Panel     Status: Abnormal   Collection Time: 08/28/23  7:49 AM  Result Value Ref Range   Retic Ct Pct 2.7 0.4 - 3.1 %   RBC. 4.38 4.22 - 5.81 MIL/uL   Retic Count, Absolute 119.1 19.0 - 186.0 K/uL   Immature Retic Fract 20.4 (H) 2.3 - 15.9 %   Reticulocyte Hemoglobin 32.9 >27.9 pg    Comment:        Given the high negative predictive value of a RET-He result > 32 pg iron deficiency is essentially excluded. If this patient is anemic other etiologies should be considered. Performed at [facility], [address] [place], [place] [address]   Ferritin     Status: None   Collection Time: 08/28/23  7:49 AM  Result Value Ref Range   Ferritin 241 24 - 336 ng/mL    Comment: Performed at [facility], [address], [place], [place] [address]  Iron and Iron Binding Capacity ([facility] only)     Status: Abnormal   Collection Time: 08/28/23  7:52 AM  Result Value Ref Range   Iron 45 45 - 182 ug/dL   TIBC 725 366 - 440 ug/dL   Saturation Ratios 12 (L) 17.9 - 39.5 %   UIBC 332 117 - 376 ug/dL    Comment: Performed at [facility], [address], [place], [place] [address]  CBC with Differential/Platelet     Status: Abnormal   Collection Time: 09/23/23 11:08 AM  Result Value Ref Range   WBC 15.3 (H) 4.0 - 10.5 K/uL   RBC 4.41 4.22 - 5.81 Mil/uL   Hemoglobin 13.5 13.0 - 17.0 g/dL   HCT 59.5 63.8 - 75.6 %   MCV 96.8 78.0 - 100.0 fl   MCHC 31.7 30.0 - 36.0 g/dL   RDW 43.3 (H) 29.5 - 18.8 %   Platelets 315.0 150.0  - 400.0 K/uL   Neutrophils Relative % 89.6 (H) 43.0 -  77.0 %    Comment: A Manual Differential was performed and is consistent with the Automated Differential.   Lymphocytes Relative 5.9 (L) 12.0 - 46.0 %   Monocytes Relative 4.1 3.0 - 12.0 %   Eosinophils Relative 0.0 0.0 - 5.0 %   Basophils Relative 0.4 0.0 - 3.0 %   Neutro Abs 13.7 (H) 1.4 - 7.7 K/uL   Lymphs Abs 0.9 0.7 - 4.0 K/uL   Monocytes Absolute 0.6 0.1 - 1.0 K/uL   Eosinophils Absolute 0.0 0.0 - 0.7 K/uL   Basophils Absolute 0.1 0.0 - 0.1 K/uL  Hepatic function panel     Status: None   Collection Time: 09/23/23 11:08 AM  Result Value Ref Range   Total Bilirubin 0.6 0.2 - 1.2 mg/dL   Bilirubin, Direct 0.1 0.0 - 0.3 mg/dL   Alkaline Phosphatase 60 39 - 117 U/L   AST 16 0 - 37 U/L   ALT 17 0 - 53 U/L   Total Protein 7.5 6.0 - 8.3 g/dL   Albumin 4.1 3.5 - 5.2 g/dL  TSH     Status: None   Collection Time: 09/23/23 11:08 AM  Result Value Ref Range   TSH 0.68 0.35 - 5.50 uIU/mL  Basic metabolic panel     Status: None   Collection Time: 09/23/23 11:08 AM  Result Value Ref Range   Sodium 139 135 - 145 mEq/L   Potassium 4.0 3.5 - 5.1 mEq/L   Chloride 102 96 - 112 mEq/L   CO2 27 19 - 32 mEq/L   Glucose, Bld 90 70 - 99 mg/dL   BUN 17 6 - 23 mg/dL   Creatinine, Ser 1.30 0.40 - 1.50 mg/dL   GFR 86.57 >84.69 mL/min    Comment: Calculated using the CKD-EPI Creatinine Equation (2021)   Calcium 9.0 8.4 - 10.5 mg/dL  Lipid panel     Status: Abnormal   Collection Time: 09/23/23 11:08 AM  Result Value Ref Range   Cholesterol 186 0 - 200 mg/dL    Comment: ATP III Classification       Desirable:  < 200 mg/dL               Borderline High:  200 - 239 mg/dL          High:  > = 629 mg/dL   Triglycerides 528.4 0.0 - 149.0 mg/dL    Comment: Normal:  <132 mg/dLBorderline High:  150 - 199 mg/dL   HDL 44.01 >02.72 mg/dL   VLDL 53.6 0.0 - 64.4 mg/dL   LDL Cholesterol 034 (H) 0 - 99 mg/dL   Total CHOL/HDL Ratio 4     Comment:                 Men          Women1/2 Average Risk     3.4          3.3Average Risk          5.0          4.42X Average Risk          9.6          7.13X Average Risk          15.0          11.0                       NonHDL 133.14     Comment: NOTE:  Non-HDL goal should be  30 mg/dL higher than patient's LDL goal (i.e. LDL goal of < 70 mg/dL, would have non-HDL goal of < 100 mg/dL)         has a past medical history of Abnormal EKG, Ankle fracture, left, DVT (deep venous thrombosis) (HCC), Dysrhythmia, Idiopathic inflammatory myopathy, Peripheral vascular disease (HCC) (2013), Polymyositis (HCC), Pulmonary embolism (HCC) (05/2012), and Seizure disorder (HCC) (11/07/2020).   reports that he has never smoked. He has never been exposed to tobacco smoke. He has never used smokeless tobacco.  Past Surgical History:  Procedure Laterality Date   CARDIAC CATHETERIZATION  2021   COLONOSCOPY     I & D EXTREMITY Right 06/07/2016   Procedure: IRRIGATION AND DEBRIDEMENT RIGHT WRIST,MEDIAN NERVE EXPLORATION NEUROLYSIS AND REPAIR;  Surgeon: Dominica Severin, MD;  Location: [facility] OR;  Service: Orthopedics;  Laterality: Right;   medial nerve surgry right arm     MUSCLE BIOPSY Right 07/02/2022   Procedure: RIGHT THIGH MUSCLE BIOPSY;  Surgeon: Griselda Miner, MD;  Location: [facility] OR;  Service: General;  Laterality: Right;   RIGHT HEART CATH N/A 09/12/2022   Procedure: RIGHT HEART CATH;  Surgeon: Laurey Morale, MD;  Location: [facility] INVASIVE CV LAB;  Service: Cardiovascular;  Laterality: N/A;    No Known Allergies  Immunization History  Administered Date(s) Administered   Influenza,inj,Quad PF,6+ Mos 09/01/2018   Moderna SARS-COV2 Booster Vaccination 11/05/2020   PPD Test 07/18/2022   Tdap 10/08/2004, 12/03/2014    Family History  Problem Relation Age of Onset   Diabetes Mother    Hypertension Mother    Heart murmur Mother    Stroke Mother    Diabetes Father    Hypertension Father    ALS Father    Stroke Sister     Rheum arthritis Brother    Diabetes Paternal Grandmother    Diabetes Paternal Grandfather    Colon cancer Neg Hx    Esophageal cancer Neg Hx    Rectal cancer Neg Hx    Stomach cancer Neg Hx      Current Outpatient Medications:    amLODipine (NORVASC) 5 MG tablet, Take 5 mg by mouth daily., Disp: , Rfl:    apixaban (ELIQUIS) 5 MG TABS tablet, Take 1 tablet (5 mg total) by mouth 2 (two) times daily., Disp: 60 tablet, Rfl: 3   Cyanocobalamin (VITAMIN B-12 PO), Take 1 tablet by mouth daily., Disp: , Rfl:    FOLIC ACID PO, Take 1 tablet by mouth daily., Disp: , Rfl:    hydroxychloroquine (PLAQUENIL) 200 MG tablet, Take 1 tablet (200 mg total) by mouth 2 (two) times daily., Disp: 60 tablet, Rfl: 2   nystatin (MYCOSTATIN/NYSTOP) powder, Apply 1 Application topically 2 (two) times daily., Disp: 60 g, Rfl: 3   predniSONE (DELTASONE) 20 MG tablet, TAKE 2 TABLETS BY MOUTH IN THE MORNING AND 1 TABLET IN THE EVENING, Disp: 90 tablet, Rfl: 0   sulfamethoxazole-trimethoprim (BACTRIM DS) 800-160 MG tablet, TAKE 1 TABLET BY MOUTH 3 TIMES A WEEK, Disp: 36 tablet, Rfl: 0   tadalafil (CIALIS) 20 MG tablet, Take 0.5-1 tablets (10-20 mg total) by mouth every other day as needed for erectile dysfunction., Disp: 10 tablet, Rfl: 11      Objective:   Vitals:   09/24/23 0825  BP: (!) 154/93  Pulse: 88  SpO2: 98%  Weight: 274 lb 6.4 oz (124.5 kg)  Height: 6\' 2"  (1.88 m)    Estimated body mass index is 35.23 kg/m as calculated from the following:  Height as of this encounter: 6\' 2"  (1.88 m).   Weight as of this encounter: 274 lb 6.4 oz (124.5 kg).  @WEIGHTCHANGE @  [employer]   09/24/23 0825  Weight: 274 lb 6.4 oz (124.5 kg)     Physical Exam   General: No distress. no O2 at rest: no Cane present: non Sitting in wheel chair: o Frail: no Obese: YES Neuro: Alert and Oriented x 3. GCS 15. Speech normal Psych: Pleasant Resp:  Barrel Chest - no.  Wheeze - no, Crackles - maybe, No overt  respiratory distress CVS: Normal heart sounds. Murmurs - no Ext: Stigmata of Connective Tissue Disease - no HEENT: Normal upper airway. PEERL +. No post nasal drip        Assessment:       ICD-10-CM   1. Interstitial lung disease due to connective tissue disease (HCC)  J84.89 CK Total (and CKMB)   M35.9 C-reactive protein    Pulmonary function test    C-reactive protein    CK Total (and CKMB)    2. Dermatomyositis affecting respiratory system (HCC)  M33.91 CK Total (and CKMB)    C-reactive protein    Pulmonary function test    C-reactive protein    CK Total (and CKMB)    3. Jo-1 positive polymyositis (HCC)  M33.20 CK Total (and CKMB)    C-reactive protein    Pulmonary function test    C-reactive protein    CK Total (and CKMB)    4. Current chronic use of systemic steroids  Z79.52 CK Total (and CKMB)    C-reactive protein    Pulmonary function test    C-reactive protein    CK Total (and CKMB)    5. Immunosuppressed status (HCC)  D84.9 CK Total (and CKMB)    C-reactive protein    Pulmonary function test    C-reactive protein    CK Total (and CKMB)    6. Elevated CK  R74.8 CK Total (and CKMB)    C-reactive protein    Pulmonary function test    C-reactive protein    CK Total (and CKMB)         Plan:     Patient Instructions     ICD-10-CM   1. Interstitial lung disease due to connective tissue disease (HCC)  J84.89    M35.9     2. Dermatomyositis affecting respiratory system (HCC)  M33.91     3. Jo-1 positive polymyositis (HCC)  M33.20     4. Current chronic use of systemic steroids  Z79.52     5. Immunosuppressed status (HCC)  D84.9     6. Elevated CK  R74.8       Glad muscle stregnh better PFT is stable/slgithly better HRCT Dec 2024 v Nov 2023 is stable/better (official report pending)  - but therei si mild exercise hypoxemia Basic labs yesterday normal Above suggests your medicines working and we can hold off anti-fibrotics.  Plan - await  HRCT results   - if wore per official report then we can consider antifibrotic's  - repeat ONO TEST (last > 30 days and we do not have ersults)  - CMA to get old results and then we we ill decide  -If exercise hypoxemia persists then we might have to consider portable oxygen for you  - taper prednisone off per rheumatology - last date 09/29/23   - continue IVIG infusions monthly (next 09/26/23), Rituxan (next feb 2025), cellcept, bactrim and plaquenil  -Monitor for disease activity  09/24/2023  -Get CRP and also total CK  -Talk to primary care physician about weight loss drugs  -resepect no  RSV vaccine and flu shot in the fall  -Monitor for pulmonary fibrosis progression ` - -Do spirometry and DLCO in 6 months Any progression we will consider antifibrotic-      Follow-up - Return to see Dr. Marchelle Gearing for a 15-minute visit in 6 months but after spirometry and DLCO  -Symptoms & Walk test at follow-up   FOLLOWUP Return in about 6 months (around 03/24/2024) for 15 min visit, with Dr Marchelle Gearing, Face to Face Visit, after Cleda Daub and DLCO.    SIGNATURE    Dr. Kalman Shan, M.D., F.C.C.P,  Pulmonary and Critical Care Medicine Staff Physician, [facility] Director - Interstitial Lung Disease  Program  Pulmonary Fibrosis [facility] at [facility] [place], [place], [address]  Pager: [phone], If no answer or between  15:00h - 7:00h: call [phone] Telephone: [phone]  9:00 AM 09/24/2023

## 2023-09-23 NOTE — Patient Instructions (Addendum)
ICD-10-CM   1. Interstitial lung disease due to connective tissue disease (HCC)  J84.89    M35.9     2. Dermatomyositis affecting respiratory system (HCC)  M33.91     3. Jo-1 positive polymyositis (HCC)  M33.20     4. Current chronic use of systemic steroids  Z79.52     5. Immunosuppressed status (HCC)  D84.9     6. Elevated CK  R74.8       Glad muscle stregnh better PFT is stable/slgithly better HRCT Dec 2024 v Nov 2023 is stable/better (official report pending)  - but therei si mild exercise hypoxemia Basic labs yesterday normal Above suggests your medicines working and we can hold off anti-fibrotics.  Plan - await HRCT results   - if wore per official report then we can consider antifibrotic's  - repeat ONO TEST (last > 30 days and we did not get get results; it was abnormal(  - if still abnormal, need night o2  -If exercise hypoxemia persists then we might have to consider portable oxygen for you  - taper prednisone off per rheumatology - last date 09/29/23   - continue IVIG infusions monthly (next 09/26/23), Rituxan (next feb 2025), cellcept, bactrim and plaquenil  -Monitor for disease activity 09/24/2023  -Get CRP and also total CK  -Talk to primary care physician about weight loss drugs  -resepect no  RSV vaccine and flu shot in the fall  -Monitor for pulmonary fibrosis progression ` - -Do spirometry and DLCO in 6 months Any progression we will consider antifibrotic-      Follow-up - Return to see Dr. Marchelle Gearing for a 15-minute visit in 6 months but after spirometry and DLCO  -Symptoms & Walk test at follow-up

## 2023-09-24 ENCOUNTER — Encounter: Payer: Self-pay | Admitting: Internal Medicine

## 2023-09-24 ENCOUNTER — Telehealth: Payer: Self-pay

## 2023-09-24 ENCOUNTER — Other Ambulatory Visit (HOSPITAL_COMMUNITY): Payer: Self-pay

## 2023-09-24 ENCOUNTER — Ambulatory Visit: Payer: BC Managed Care – PPO | Admitting: Internal Medicine

## 2023-09-24 VITALS — BP 154/93 | HR 88 | Ht 74.0 in | Wt 274.4 lb

## 2023-09-24 DIAGNOSIS — J8489 Other specified interstitial pulmonary diseases: Secondary | ICD-10-CM

## 2023-09-24 DIAGNOSIS — D849 Immunodeficiency, unspecified: Secondary | ICD-10-CM

## 2023-09-24 DIAGNOSIS — R748 Abnormal levels of other serum enzymes: Secondary | ICD-10-CM

## 2023-09-24 DIAGNOSIS — M3391 Dermatopolymyositis, unspecified with respiratory involvement: Secondary | ICD-10-CM | POA: Diagnosis not present

## 2023-09-24 DIAGNOSIS — M359 Systemic involvement of connective tissue, unspecified: Secondary | ICD-10-CM

## 2023-09-24 DIAGNOSIS — M3320 Polymyositis, organ involvement unspecified: Secondary | ICD-10-CM

## 2023-09-24 DIAGNOSIS — Z7952 Long term (current) use of systemic steroids: Secondary | ICD-10-CM

## 2023-09-24 LAB — C-REACTIVE PROTEIN: CRP: 1.2 mg/dL (ref 0.5–20.0)

## 2023-09-24 NOTE — Telephone Encounter (Signed)
Sent to PA team

## 2023-09-24 NOTE — Telephone Encounter (Signed)
Called patient back and did relay the results

## 2023-09-24 NOTE — Telephone Encounter (Signed)
Returned your call.

## 2023-09-24 NOTE — Telephone Encounter (Signed)
PA request has been Submitted. New Encounter created for follow up. For additional info see Pharmacy Prior Auth telephone encounter from 09/24/23.

## 2023-09-24 NOTE — Telephone Encounter (Signed)
Labs are stable and look good!  No changes at this time

## 2023-09-24 NOTE — Telephone Encounter (Signed)
Pharmacy Patient Advocate Encounter   Received notification from Pt Calls Messages that prior authorization for Tadalafil 20MG  tablets is required/requested.   Insurance verification completed.   The patient is insured through [employer] .   Per test claim: PA required; PA submitted to above mentioned insurance via CoverMyMeds Key/confirmation #/EOC [id number] Status is pending

## 2023-09-25 ENCOUNTER — Other Ambulatory Visit (HOSPITAL_COMMUNITY): Payer: Self-pay

## 2023-09-25 LAB — CK TOTAL AND CKMB (NOT AT ARMC)
CK, MB: <0.7 ng/mL (ref 0–5.0)
Total CK: 105 U/L (ref 44–196)

## 2023-09-25 NOTE — Telephone Encounter (Signed)
Called left vm to call of to make pt aware of this.

## 2023-09-25 NOTE — Telephone Encounter (Signed)
Pharmacy Patient Advocate Encounter  Received notification from [facility] that Prior Authorization for Tadalafil 20MG  tablets has been APPROVED from 08/25/2023 to 09/23/2024   PA #/Case ID/Reference #: [id number]

## 2023-09-26 DIAGNOSIS — M3320 Polymyositis, organ involvement unspecified: Secondary | ICD-10-CM | POA: Diagnosis not present

## 2023-09-26 NOTE — Telephone Encounter (Signed)
Pt has been notified.

## 2023-09-27 ENCOUNTER — Telehealth: Payer: Self-pay | Admitting: Family Medicine

## 2023-09-27 NOTE — Telephone Encounter (Signed)
Caller name: Elam [place]  On DPR?: N/A  Call back number: [phone] (mobile)  Provider they see: Sheliah Hatch, MD  Reason for call:   In the future please use [id number] - [place], [place] - [address] AT [facility] OF [address] for ALL prescriptions.   tadalafil tadalafil (CIALIS) 20 MG tablet [facility].   FYI: Call came in from [facility]

## 2023-09-27 NOTE — Telephone Encounter (Signed)
Called patient to clarify and let him know on my end I see the Cialis was sent to [facility] on [facility] and let him know that [facility] was the only pharmacy we have on file for him. He stated that he thinks everything is sorted out now and thanked me for the call

## 2023-10-06 ENCOUNTER — Telehealth: Payer: Self-pay | Admitting: Internal Medicine

## 2023-10-06 DIAGNOSIS — E041 Nontoxic single thyroid nodule: Secondary | ICD-10-CM

## 2023-10-06 NOTE — Telephone Encounter (Signed)
Please let Mr. Cameron Kim know that the fibrosis is present and may be a little bit more organized but I really want to draw attention to the fact that he has a thyroid nodule.  If this has not been worked up in the past with ultrasound or biopsy he needs to talk to primary care doctor   IMPRESSION: 1. Pulmonary parenchymal pattern of interstitial lung disease, as described above, more organized than on 07/04/2022. While this may be due to collagen vascular disease related fibrotic nonspecific interstitial pneumonitis, the possibility post COVID-19 inflammatory fibrosis is also considered. Findings are indeterminate for UIP per consensus guidelines: Diagnosis of Idiopathic Pulmonary Fibrosis: An Official ATS/ERS/JRS/ALAT Clinical Practice Guideline. Am Rosezetta Schlatter Crit Care Med Vol 198, Iss 5, [phone], Jun 08 2017. 2. Minimal air trapping, indicative of small airways disease. 3. 1.7 cm low-attenuation left thyroid nodule. Recommend thyroid ultrasound. (Ref: J Am Coll Radiol. 2015 Feb;12(2): 143-50).     Electronically Signed   By: Leanna Battles M.D.   On: 09/25/2023 09:16

## 2023-10-11 NOTE — Telephone Encounter (Signed)
 Thyroid US ordered for complete evaluation

## 2023-10-11 NOTE — Telephone Encounter (Signed)
 I called and spoke with the pt and notified of results from Dr Marchelle Gearing  He verbalized understanding  I will route to PCP- Dr Beverely Low- pt not scheduled with her until June 2025  He states has never had work up for thyroid nodule

## 2023-10-16 ENCOUNTER — Telehealth: Payer: Self-pay

## 2023-10-16 NOTE — Telephone Encounter (Signed)
 Called patient and provided him with phone number for scheduling [phone]

## 2023-10-16 NOTE — Telephone Encounter (Signed)
 Copied from CRM [id number]. Topic: Clinical - Request for Lab/Test Order >> Oct 16, 2023  1:36 PM Laurier C wrote: Reason for CRM: Patient is wanting to know will he be scheduled for the MRI or CT scan for his Thyroid . Patient would like a call back to discuss the next steps at [phone]

## 2023-10-17 ENCOUNTER — Telehealth: Payer: Self-pay | Admitting: Internal Medicine

## 2023-10-17 NOTE — Telephone Encounter (Signed)
 The neck pain/tightness is likely due to stopping the prednisone (it was suppressing inflammation) and you should let your Rheumatologist know.  It shouldn't have anything to do with the thyroid

## 2023-10-17 NOTE — Telephone Encounter (Signed)
 Pt was notified of this . Pt then stated he was having difficult swallowing for the last few days. Pt was sent to Nurse Triage

## 2023-10-17 NOTE — Telephone Encounter (Signed)
 Copied from CRM [id number]. Topic: Clinical - Medical Advice >> Oct 17, 2023  9:54 AM Carmell SAUNDERS wrote: Reason for CRM: Patient has some concerns about his neck. Having some cramping for the last 3 days and wants to know if its regarding his thyroid  issue and/or the predniSONE  (DELTASONE ) 20 MG tablet medication he stopped taking. Cb# [phone]

## 2023-10-17 NOTE — Telephone Encounter (Signed)
 Overnight pulse oximetry done on 10/07/2023.  The analyzable duration was only 2 hours and 15 minutes.  He desaturated and this for 4 minutes and 35 seconds.  This was below 88%.  Because of desaturations less than 5 minutes he does not qualify for night oxygen.  However the test duration was too short.  Only option is to repeated where he sleeping for a longer time.  Otherwise we can just monitor the situation.  Please let him know this

## 2023-10-18 ENCOUNTER — Encounter: Payer: Self-pay | Admitting: Family Medicine

## 2023-10-18 ENCOUNTER — Ambulatory Visit (INDEPENDENT_AMBULATORY_CARE_PROVIDER_SITE_OTHER): Payer: BC Managed Care – PPO | Admitting: Family Medicine

## 2023-10-18 VITALS — BP 128/74 | HR 95 | Temp 98.7°F | Ht 74.0 in | Wt 275.2 lb

## 2023-10-18 DIAGNOSIS — M3320 Polymyositis, organ involvement unspecified: Secondary | ICD-10-CM

## 2023-10-18 DIAGNOSIS — R1310 Dysphagia, unspecified: Secondary | ICD-10-CM

## 2023-10-18 MED ORDER — PREDNISONE 10 MG PO TABS: ORAL_TABLET | ORAL | 0 refills | Status: DC

## 2023-10-18 NOTE — Progress Notes (Signed)
   Subjective:    Patient ID: Cameron Kim, male    DOB: Jun 04, 1971, 53 y.o.   MRN: [id number]  HPI Neck pain- pt thought he had a 'crick in his neck' starting after Christmas.  He stopped the Prednisone  on 12/23.  Pain progressed and he was not able to turn his head w/o significant pain.  Took Tylenol  w/ some relief.  In the past few days he has had increased trouble swallowing.  Last night was hard to drink fluids.  This morning was a bit easier.     Review of Systems For ROS see HPI     Objective:   Physical Exam Vitals reviewed.  Constitutional:      General: He is not in acute distress.    Appearance: Normal appearance. He is not ill-appearing.  HENT:     Head: Normocephalic and atraumatic.  Eyes:     Extraocular Movements: Extraocular movements intact.     Conjunctiva/sclera: Conjunctivae normal.  Musculoskeletal:     Cervical back: Neck supple. Tenderness (TTP over upper traps bilaterally) present.  Skin:    General: Skin is warm and dry.  Neurological:     Mental Status: He is alert and oriented to person, place, and time.     Cranial Nerves: No cranial nerve deficit.  Psychiatric:        Mood and Affect: Mood normal.        Behavior: Behavior normal.        Thought Content: Thought content normal.           Assessment & Plan:

## 2023-10-18 NOTE — Patient Instructions (Signed)
 Follow up as needed or as scheduled Call Dr Anselm and see what they have to say about all of this- I'm worried your polymyositis has flared There is a prescription for Prednisone  available at the pharmacy should the pain worsen, you develop weakness, or the swallowing is worse Make sure you are staying hydrated.  Drink LOTS of fluids! Call with any questions or concerns Hang in there! HAPPY EARLY BIRTHDAY!!

## 2023-10-19 NOTE — Telephone Encounter (Signed)
 Results of ONO sent to pt via mychart.

## 2023-10-19 NOTE — Assessment & Plan Note (Signed)
 Deteriorated.  Pt weaned off his prednisone  on 12/23.  Starting 12/26 he developed pain- like a 'crick'- in his neck.  The pain progressed and he was not able to turn his head w/o significant discomfort.  Since then, he has developed difficulty swallowing over the past few days.  He reports this feels very similar to when his sxs started and he was fearful of letting things get 'too bad'.  Reports pain and ability to swallow are better this morning than last night but finds that sxs worsen as the day goes on.  Encouraged him to reach out to [facility] ASAP and let them know about his recurrent sxs now that he is no longer on Prednisone .  Pt is understandably hesitant to restart Prednisone  but with the weekend approaching, I want him to have medication if needed.  Prescription sent to pharmacy and he agrees to call [facility] to determine the next steps.

## 2023-10-22 ENCOUNTER — Ambulatory Visit (HOSPITAL_BASED_OUTPATIENT_CLINIC_OR_DEPARTMENT_OTHER)
Admission: RE | Admit: 2023-10-22 | Discharge: 2023-10-22 | Disposition: A | Discharge status: Home / Self Care | Payer: BC Managed Care – PPO | Source: Ambulatory Visit | Attending: Family Medicine | Admitting: Family Medicine

## 2023-10-22 DIAGNOSIS — E042 Nontoxic multinodular goiter: Secondary | ICD-10-CM | POA: Diagnosis not present

## 2023-10-22 DIAGNOSIS — E041 Nontoxic single thyroid nodule: Secondary | ICD-10-CM | POA: Insufficient documentation

## 2023-10-25 ENCOUNTER — Telehealth: Payer: Self-pay | Admitting: Family Medicine

## 2023-10-25 ENCOUNTER — Telehealth: Payer: Self-pay

## 2023-10-25 ENCOUNTER — Encounter: Payer: Self-pay | Admitting: Family Medicine

## 2023-10-25 NOTE — Telephone Encounter (Signed)
Pt has been notified. See phone note

## 2023-10-25 NOTE — Telephone Encounter (Signed)
-----   Message from Neena Rhymes sent at 10/25/2023 12:30 PM EST ----- Thankfully thyroid ultrasound looks fine.  No nodules that require follow up or biopsy.  This is great news!

## 2023-10-25 NOTE — Telephone Encounter (Signed)
Copied from CRM [id number]. Topic: General - Other >> Oct 25, 2023  1:24 PM Deaijah H wrote: Reason for CRM: Patient called in to speak with Kenney Houseman due to phone dying / please call [phone]    Will forward this to Clinical, someone will return his call as soon as they have a moment to do so.

## 2023-10-30 DIAGNOSIS — M3320 Polymyositis, organ involvement unspecified: Secondary | ICD-10-CM | POA: Diagnosis not present

## 2023-11-01 DIAGNOSIS — M3320 Polymyositis, organ involvement unspecified: Secondary | ICD-10-CM | POA: Diagnosis not present

## 2023-11-06 DIAGNOSIS — Z79899 Other long term (current) drug therapy: Secondary | ICD-10-CM | POA: Diagnosis not present

## 2023-11-14 DIAGNOSIS — M0579 Rheumatoid arthritis with rheumatoid factor of multiple sites without organ or systems involvement: Secondary | ICD-10-CM | POA: Diagnosis not present

## 2023-11-18 ENCOUNTER — Telehealth: Payer: Self-pay | Admitting: Family Medicine

## 2023-11-18 NOTE — Telephone Encounter (Signed)
 Type of form received: Dental Clearance Form  Additional comments:   Received by: Hassie Lint - Front Desk  Form should be Faxed/mailed to: (address/ fax #) [phone]  Is patient requesting call for pickup: N/A  Form placed:  Labeled & placed in provider bin  Attach charge sheet.  Provider will determine charge.  Individual made aware of 3-5 business day turn around? N/A

## 2023-11-18 NOTE — Telephone Encounter (Signed)
 Form has been placed in folder at nurse station

## 2023-11-20 ENCOUNTER — Telehealth: Payer: Self-pay

## 2023-11-20 NOTE — Telephone Encounter (Signed)
Cameron Kim - E2C2 called for update on clearance form. Was informed it wasn't signed yet Cameron Kim has 5-7 business days it was just sent over Monday 11/18/2023 late afternoon.

## 2023-11-20 NOTE — Telephone Encounter (Signed)
Please call pt and ask him for an accurate med list.  Since he is seeing multiple specialists, I'm not sure I have the most UTD information.  (Specifically is he still taking Prednisone and Eliquis)

## 2023-11-20 NOTE — Telephone Encounter (Signed)
Pt states he is out right now and has no idea of his meds and dose. He will call back with dosage or send MyChart message.

## 2023-11-20 NOTE — Telephone Encounter (Signed)
Copied from CRM [id number]. Topic: General - Other >> Nov 20, 2023  9:12 AM Almira Coaster wrote: Reason for CRM: Hilda Lias from Dr.Mickens office is calling regarding a dental clearance form that was faxed to the office. Patient is scheduled today at 10am. Their best call back number is [phone].

## 2023-11-27 NOTE — Telephone Encounter (Signed)
 Called pt and left vm

## 2023-11-28 DIAGNOSIS — M3320 Polymyositis, organ involvement unspecified: Secondary | ICD-10-CM | POA: Diagnosis not present

## 2023-11-28 NOTE — Telephone Encounter (Signed)
Pt has been notified  Pt was advised to bring all medication in next appointment  to ensure we have everything up to date.

## 2023-11-28 NOTE — Telephone Encounter (Signed)
 See other phone note

## 2023-11-28 NOTE — Telephone Encounter (Signed)
Form completed and returned to [place].  Pt will need clearance from Rheumatology

## 2023-12-12 DIAGNOSIS — M3320 Polymyositis, organ involvement unspecified: Secondary | ICD-10-CM | POA: Diagnosis not present

## 2023-12-13 DIAGNOSIS — M3320 Polymyositis, organ involvement unspecified: Secondary | ICD-10-CM | POA: Diagnosis not present

## 2023-12-18 ENCOUNTER — Ambulatory Visit (INDEPENDENT_AMBULATORY_CARE_PROVIDER_SITE_OTHER): Admitting: Family Medicine

## 2023-12-18 ENCOUNTER — Encounter: Payer: Self-pay | Admitting: Family Medicine

## 2023-12-18 VITALS — BP 132/72 | HR 79 | Temp 97.9°F | Ht 74.0 in | Wt 256.0 lb

## 2023-12-18 DIAGNOSIS — L0201 Cutaneous abscess of face: Secondary | ICD-10-CM | POA: Diagnosis not present

## 2023-12-18 MED ORDER — DOXYCYCLINE HYCLATE 100 MG PO TABS: 100.0000 mg | ORAL_TABLET | Freq: Two times a day (BID) | ORAL | 0 refills | Status: AC

## 2023-12-18 NOTE — Patient Instructions (Signed)
 Follow up on Monday to recheck your facial abscess START the Doxycycline twice daily- take w/ food APPLY hot compresses to the area to help with swelling and drainage DO NOT POP OR PICK IF the redness/swelling/pain gets worse over the weekend or starts spreading up to the eye- please go to the ER Call with any questions or concerns Hang in there!!

## 2023-12-18 NOTE — Progress Notes (Signed)
   Subjective:    Patient ID: Cameron Kim, male    DOB: November 25, 1970, 53 y.o.   MRN: [id number]  HPI Boil- pt reports he had a small whitehead on Friday.  He attempted to pop the area and now area is red, hard, swollen.  No fevers, chills, malaise, or aches   Review of Systems For ROS see HPI     Objective:   Physical Exam Vitals reviewed.  Constitutional:      General: He is not in acute distress.    Appearance: Normal appearance. He is not ill-appearing.  HENT:     Head: Normocephalic and atraumatic.   Skin:    General: Skin is warm and dry.  Neurological:     Mental Status: He is alert and oriented to person, place, and time. Mental status is at baseline.  Psychiatric:        Mood and Affect: Mood normal.        Behavior: Behavior normal.        Thought Content: Thought content normal.           Assessment & Plan:  Facial abscess- new.  Discussed w/ pt the option of outpt tx vs inpt tx.  Since he is afebrile, well appearing, and this does not involve his orbits, will start w/ outpt Doxycycline and hot compresses.  I drew a circle around the outer margin and he is aware that if things progress beyond these borders, he needs to go in ASAP.  Pt expressed understanding and is in agreement w/ plan.

## 2023-12-23 ENCOUNTER — Ambulatory Visit (INDEPENDENT_AMBULATORY_CARE_PROVIDER_SITE_OTHER): Admitting: Family Medicine

## 2023-12-23 ENCOUNTER — Encounter: Payer: Self-pay | Admitting: Family Medicine

## 2023-12-23 VITALS — BP 122/72 | HR 78 | Temp 99.1°F | Ht 74.0 in

## 2023-12-23 DIAGNOSIS — F419 Anxiety disorder, unspecified: Secondary | ICD-10-CM

## 2023-12-23 DIAGNOSIS — L0201 Cutaneous abscess of face: Secondary | ICD-10-CM

## 2023-12-23 DIAGNOSIS — F32A Depression, unspecified: Secondary | ICD-10-CM | POA: Diagnosis not present

## 2023-12-23 MED ORDER — FLUOXETINE HCL 20 MG PO CAPS: 20.0000 mg | ORAL_CAPSULE | Freq: Every day | ORAL | 3 refills | Status: AC

## 2023-12-23 NOTE — Patient Instructions (Signed)
 Follow up in 4 weeks to recheck mood FINISH the Doxycycline as directed- take w/ food START the Fluoxetine daily Call with any questions or concerns Hang in there!!!

## 2023-12-23 NOTE — Progress Notes (Unsigned)
   Subjective:    Patient ID: Cameron Kim, male    DOB: 04/04/1971, 53 y.o.   MRN: [id number]  HPI Facial abscess- was started on Doxycycline 3/12.  Redness and swelling are much improved.  Pt reports pain is less.    Insomnia- having trouble both falling asleep and staying asleep.  Is taking late afternoon naps some days which he knows delays his ability to go to sleep.  Is waking throughout the night and getting up early.  No relief w/ Trazodone in the past- even at higher doses.  Finds that he's more irritable.   Review of Systems For ROS see HPI     Objective:   Physical Exam Vitals reviewed.  Constitutional:      General: He is not in acute distress.    Appearance: Normal appearance. He is obese. He is not ill-appearing.  HENT:     Head: Normocephalic and atraumatic.  Eyes:     Extraocular Movements: Extraocular movements intact.     Conjunctiva/sclera: Conjunctivae normal.  Musculoskeletal:     Cervical back: Neck supple.  Lymphadenopathy:     Cervical: No cervical adenopathy.  Skin:    General: Skin is warm and dry.     Findings: Erythema (very faint erythema on L cheek, no longer swollen.  mild induration present but much less than last visit.  pustule has drained and scabbed over) present.  Neurological:     Mental Status: He is alert and oriented to person, place, and time. Mental status is at baseline.  Psychiatric:     Comments: Anxious, irritable           Assessment & Plan:   Facial abscess- much improved.  No need for I&D or IV abx.  Finish Doxy as directed.  Pt expressed understanding and is in agreement w/ plan.

## 2023-12-24 NOTE — Assessment & Plan Note (Signed)
 Deteriorated.  Pt is feeling very overwhelmed.  He has always been the one to help everyone with their problems and he has not had time to process his own recent issues- poor health, incarceration, loss of job.  He is having trouble both falling asleep and staying asleep.  Is more irritable.  Is open to the idea of medication.  Will start Fluoxetine 20mg  daily and monitor closely for improvement.  Pt expressed understanding and is in agreement w/ plan.

## 2024-01-09 DIAGNOSIS — M3320 Polymyositis, organ involvement unspecified: Secondary | ICD-10-CM | POA: Diagnosis not present

## 2024-01-10 DIAGNOSIS — M3320 Polymyositis, organ involvement unspecified: Secondary | ICD-10-CM | POA: Diagnosis not present

## 2024-01-22 DIAGNOSIS — M0579 Rheumatoid arthritis with rheumatoid factor of multiple sites without organ or systems involvement: Secondary | ICD-10-CM | POA: Diagnosis not present

## 2024-01-22 DIAGNOSIS — M3320 Polymyositis, organ involvement unspecified: Secondary | ICD-10-CM | POA: Diagnosis not present

## 2024-01-22 DIAGNOSIS — Z79899 Other long term (current) drug therapy: Secondary | ICD-10-CM | POA: Diagnosis not present

## 2024-01-27 ENCOUNTER — Ambulatory Visit: Admitting: Family Medicine

## 2024-01-27 DIAGNOSIS — M0579 Rheumatoid arthritis with rheumatoid factor of multiple sites without organ or systems involvement: Secondary | ICD-10-CM | POA: Insufficient documentation

## 2024-02-03 DIAGNOSIS — Q6689 Other  specified congenital deformities of feet: Secondary | ICD-10-CM | POA: Diagnosis not present

## 2024-02-03 DIAGNOSIS — L300 Nummular dermatitis: Secondary | ICD-10-CM | POA: Diagnosis not present

## 2024-02-03 DIAGNOSIS — B351 Tinea unguium: Secondary | ICD-10-CM | POA: Diagnosis not present

## 2024-02-03 DIAGNOSIS — M3320 Polymyositis, organ involvement unspecified: Secondary | ICD-10-CM | POA: Diagnosis not present

## 2024-02-06 DIAGNOSIS — M3320 Polymyositis, organ involvement unspecified: Secondary | ICD-10-CM | POA: Diagnosis not present

## 2024-02-07 DIAGNOSIS — M3320 Polymyositis, organ involvement unspecified: Secondary | ICD-10-CM | POA: Diagnosis not present

## 2024-02-18 DIAGNOSIS — H25043 Posterior subcapsular polar age-related cataract, bilateral: Secondary | ICD-10-CM | POA: Diagnosis not present

## 2024-02-26 DIAGNOSIS — H25813 Combined forms of age-related cataract, bilateral: Secondary | ICD-10-CM | POA: Diagnosis not present

## 2024-02-27 NOTE — Progress Notes (Unsigned)
 No show

## 2024-02-28 ENCOUNTER — Other Ambulatory Visit: Payer: BC Managed Care – PPO

## 2024-02-28 ENCOUNTER — Ambulatory Visit: Payer: BC Managed Care – PPO | Admitting: Hematology and Oncology

## 2024-03-05 DIAGNOSIS — M3320 Polymyositis, organ involvement unspecified: Secondary | ICD-10-CM | POA: Diagnosis not present

## 2024-03-06 DIAGNOSIS — M3320 Polymyositis, organ involvement unspecified: Secondary | ICD-10-CM | POA: Diagnosis not present

## 2024-03-13 DIAGNOSIS — F419 Anxiety disorder, unspecified: Secondary | ICD-10-CM | POA: Diagnosis not present

## 2024-03-13 DIAGNOSIS — H25811 Combined forms of age-related cataract, right eye: Secondary | ICD-10-CM | POA: Diagnosis not present

## 2024-03-23 ENCOUNTER — Encounter: Payer: BC Managed Care – PPO | Admitting: Family Medicine

## 2024-03-27 DIAGNOSIS — H25812 Combined forms of age-related cataract, left eye: Secondary | ICD-10-CM | POA: Diagnosis not present

## 2024-03-27 DIAGNOSIS — I10 Essential (primary) hypertension: Secondary | ICD-10-CM | POA: Diagnosis not present

## 2024-04-02 DIAGNOSIS — M3320 Polymyositis, organ involvement unspecified: Secondary | ICD-10-CM | POA: Diagnosis not present

## 2024-04-03 DIAGNOSIS — M3320 Polymyositis, organ involvement unspecified: Secondary | ICD-10-CM | POA: Diagnosis not present

## 2024-04-08 ENCOUNTER — Telehealth: Payer: Self-pay | Admitting: Hematology and Oncology

## 2024-04-08 ENCOUNTER — Encounter: Payer: Self-pay | Admitting: Family Medicine

## 2024-04-08 ENCOUNTER — Ambulatory Visit: Admitting: Family Medicine

## 2024-04-08 VITALS — BP 128/74 | HR 77 | Temp 98.0°F | Ht 74.0 in | Wt 244.2 lb

## 2024-04-08 DIAGNOSIS — I10 Essential (primary) hypertension: Secondary | ICD-10-CM | POA: Diagnosis not present

## 2024-04-08 DIAGNOSIS — Z125 Encounter for screening for malignant neoplasm of prostate: Secondary | ICD-10-CM

## 2024-04-08 DIAGNOSIS — Z0000 Encounter for general adult medical examination without abnormal findings: Secondary | ICD-10-CM

## 2024-04-08 LAB — HEPATIC FUNCTION PANEL
ALT: 9 U/L (ref 0–53)
AST: 13 U/L (ref 0–37)
Albumin: 4 g/dL (ref 3.5–5.2)
Alkaline Phosphatase: 72 U/L (ref 39–117)
Bilirubin, Direct: 0.3 mg/dL (ref 0.0–0.3)
Total Bilirubin: 1.8 mg/dL — ABNORMAL HIGH (ref 0.2–1.2)
Total Protein: 9.2 g/dL — ABNORMAL HIGH (ref 6.0–8.3)

## 2024-04-08 LAB — LIPID PANEL
Cholesterol: 128 mg/dL (ref 0–200)
HDL: 36.6 mg/dL — ABNORMAL LOW (ref >39.00)
LDL Cholesterol: 64 mg/dL (ref 0–99)
NonHDL: 91.46
Total CHOL/HDL Ratio: 3
Triglycerides: 137 mg/dL (ref 0.0–149.0)
VLDL: 27.4 mg/dL (ref 0.0–40.0)

## 2024-04-08 LAB — BASIC METABOLIC PANEL WITH GFR
BUN: 11 mg/dL (ref 6–23)
CO2: 31 meq/L (ref 19–32)
Calcium: 9.1 mg/dL (ref 8.4–10.5)
Chloride: 99 meq/L (ref 96–112)
Creatinine, Ser: 0.88 mg/dL (ref 0.40–1.50)
GFR: 98.21 mL/min (ref >60.00)
Glucose, Bld: 79 mg/dL (ref 70–99)
Potassium: 4.5 meq/L (ref 3.5–5.1)
Sodium: 136 meq/L (ref 135–145)

## 2024-04-08 LAB — CBC WITH DIFFERENTIAL/PLATELET
Basophils Absolute: 0.1 10*3/uL (ref 0.0–0.1)
Basophils Relative: 0.6 % (ref 0.0–3.0)
Eosinophils Absolute: 0.1 10*3/uL (ref 0.0–0.7)
Eosinophils Relative: 1.1 % (ref 0.0–5.0)
HCT: 36.4 % — ABNORMAL LOW (ref 39.0–52.0)
Hemoglobin: 12.4 g/dL — ABNORMAL LOW (ref 13.0–17.0)
Lymphocytes Relative: 14 % (ref 12.0–46.0)
Lymphs Abs: 1.8 10*3/uL (ref 0.7–4.0)
MCHC: 34 g/dL (ref 30.0–36.0)
MCV: 90.4 fl (ref 78.0–100.0)
Monocytes Absolute: 1.2 10*3/uL — ABNORMAL HIGH (ref 0.1–1.0)
Monocytes Relative: 9.6 % (ref 3.0–12.0)
Neutro Abs: 9.4 10*3/uL — ABNORMAL HIGH (ref 1.4–7.7)
Neutrophils Relative %: 74.7 % (ref 43.0–77.0)
Platelets: 383 10*3/uL (ref 150.0–400.0)
RBC: 4.03 Mil/uL — ABNORMAL LOW (ref 4.22–5.81)
RDW: 15.4 % (ref 11.5–15.5)
WBC: 12.6 10*3/uL — ABNORMAL HIGH (ref 4.0–10.5)

## 2024-04-08 LAB — PSA: PSA: 1.67 ng/mL (ref 0.10–4.00)

## 2024-04-08 LAB — TSH: TSH: 0.9 u[IU]/mL (ref 0.35–5.50)

## 2024-04-08 NOTE — Patient Instructions (Addendum)
 Follow up in 6 months to recheck blood pressure and cholesterol We'll notify you of your lab results and make any changes if needed Continue to work on healthy diet and regular exercise- you look great!! Call with any questions or concerns Stay Safe!  Stay Healthy! Happy 4th!!!

## 2024-04-08 NOTE — Telephone Encounter (Signed)
 Patient called to reschedule missed appointment. He is aware of the scheduled appointments.

## 2024-04-08 NOTE — Progress Notes (Signed)
   Subjective:    Patient ID: Cameron Kim, male    DOB: 1970/11/24, 53 y.o.   MRN: [id number]  HPI CPE- UTD on Tdap, colonoscopy  Patient Care Team    Relationship Specialty Notifications Start End  Mahlon Comer BRAVO, MD PCP - General Family Medicine  01/13/14   Court Dorn PARAS, MD PCP - Cardiology Cardiology  05/09/22   Dolphus Reiter, MD Consulting Physician Rheumatology  07/20/22     Health Maintenance  Topic Date Due   Pneumococcal Vaccine 46-65 Years old (1 of 2 - PCV) Never done   Hepatitis B Vaccines (1 of 3 - 19+ 3-dose series) Never done   Zoster Vaccines- Shingrix (1 of 2) Never done   COVID-19 Vaccine (1) 11/05/2020   INFLUENZA VACCINE  05/08/2024   DTaP/Tdap/Td (3 - Td or Tdap) 12/03/2024   Colonoscopy  08/23/2032   Hepatitis C Screening  Completed   HIV Screening  Completed   HPV VACCINES  Aged Out   Meningococcal B Vaccine  Aged Out      Review of Systems Patient reports no vision/hearing changes, anorexia, fever ,adenopathy, persistant/recurrent hoarseness, swallowing issues, chest pain, palpitations, edema, persistant/recurrent cough, hemoptysis, dyspnea (rest,exertional, paroxysmal nocturnal), gastrointestinal  bleeding (melena, rectal bleeding), abdominal pain, excessive heart burn, GU symptoms (dysuria, hematuria, voiding/incontinence issues) syncope, focal weakness, memory loss, numbness & tingling, skin/hair changes, depression, anxiety, abnormal bruising/bleeding, musculoskeletal symptoms/signs.   + peeling nails + 12 lb weight loss- pt trying to eat better, move more    Objective:   Physical Exam General Appearance:    Alert, cooperative, no distress, appears stated age  Head:    Normocephalic, without obvious abnormality, atraumatic  Eyes:    PERRL, conjunctiva/corneas clear, EOM's intact both eyes       Ears:    Normal TM's and external ear canals, both ears  Nose:   Nares normal, septum midline, mucosa normal, no drainage   or sinus tenderness   Throat:   Lips, mucosa, and tongue normal; teeth and gums normal  Neck:   Supple, symmetrical, trachea midline, no adenopathy;       thyroid :  No enlargement/tenderness/nodules  Back:     Symmetric, no curvature, ROM normal, no CVA tenderness  Lungs:     Clear to auscultation bilaterally, respirations unlabored  Chest wall:    No tenderness or deformity  Heart:    Regular rate and rhythm, S1 and S2 normal  Abdomen:     Soft, non-tender, bowel sounds active all four quadrants,    no masses, no organomegaly  Genitalia:    deferred  Rectal:    Extremities:   Extremities normal, atraumatic, no cyanosis or edema  Pulses:   2+ and symmetric all extremities  Skin:   Skin color, texture, turgor normal, no rashes or lesions  Lymph nodes:   Cervical, supraclavicular, and axillary nodes normal  Neurologic:   CNII-XII intact. Normal strength, sensation and reflexes      throughout          Assessment & Plan:

## 2024-04-09 ENCOUNTER — Ambulatory Visit: Payer: Self-pay | Admitting: Family Medicine

## 2024-04-21 NOTE — Assessment & Plan Note (Signed)
Pt's PE unchanged from previous.  UTD on Tdap, colonoscopy.  Check labs.  Anticipatory guidance provided.

## 2024-04-22 ENCOUNTER — Inpatient Hospital Stay: Attending: Hematology and Oncology

## 2024-04-22 ENCOUNTER — Inpatient Hospital Stay: Admitting: Hematology and Oncology

## 2024-04-22 VITALS — BP 135/64 | HR 74 | Temp 97.4°F | Resp 15 | Wt 246.1 lb

## 2024-04-22 DIAGNOSIS — Z862 Personal history of diseases of the blood and blood-forming organs and certain disorders involving the immune mechanism: Secondary | ICD-10-CM | POA: Diagnosis not present

## 2024-04-22 DIAGNOSIS — Z8639 Personal history of other endocrine, nutritional and metabolic disease: Secondary | ICD-10-CM | POA: Insufficient documentation

## 2024-04-22 DIAGNOSIS — Z79899 Other long term (current) drug therapy: Secondary | ICD-10-CM | POA: Diagnosis not present

## 2024-04-22 DIAGNOSIS — Z86718 Personal history of other venous thrombosis and embolism: Secondary | ICD-10-CM | POA: Insufficient documentation

## 2024-04-22 DIAGNOSIS — I82432 Acute embolism and thrombosis of left popliteal vein: Secondary | ICD-10-CM

## 2024-04-22 DIAGNOSIS — D72829 Elevated white blood cell count, unspecified: Secondary | ICD-10-CM

## 2024-04-22 DIAGNOSIS — Z86711 Personal history of pulmonary embolism: Secondary | ICD-10-CM | POA: Diagnosis not present

## 2024-04-22 DIAGNOSIS — Z7901 Long term (current) use of anticoagulants: Secondary | ICD-10-CM | POA: Insufficient documentation

## 2024-04-22 LAB — CMP (CANCER CENTER ONLY)
ALT: 10 U/L (ref 0–44)
AST: 15 U/L (ref 15–41)
Albumin: 3.9 g/dL (ref 3.5–5.0)
Alkaline Phosphatase: 75 U/L (ref 38–126)
Anion gap: 5 (ref 5–15)
BUN: 11 mg/dL (ref 6–20)
CO2: 28 mmol/L (ref 22–32)
Calcium: 9.2 mg/dL (ref 8.9–10.3)
Chloride: 106 mmol/L (ref 98–111)
Creatinine: 0.81 mg/dL (ref 0.61–1.24)
GFR, Estimated: >60 mL/min (ref >60)
Glucose, Bld: 103 mg/dL — ABNORMAL HIGH (ref 70–99)
Potassium: 3.7 mmol/L (ref 3.5–5.1)
Sodium: 139 mmol/L (ref 135–145)
Total Bilirubin: 0.7 mg/dL (ref 0.0–1.2)
Total Protein: 8.1 g/dL (ref 6.5–8.1)

## 2024-04-22 LAB — CBC WITH DIFFERENTIAL (CANCER CENTER ONLY)
Abs Immature Granulocytes: 0.03 K/uL (ref 0.00–0.07)
Basophils Absolute: 0.1 K/uL (ref 0.0–0.1)
Basophils Relative: 1 %
Eosinophils Absolute: 0.1 K/uL (ref 0.0–0.5)
Eosinophils Relative: 1 %
HCT: 39.9 % (ref 39.0–52.0)
Hemoglobin: 13.6 g/dL (ref 13.0–17.0)
Immature Granulocytes: 0 %
Lymphocytes Relative: 15 %
Lymphs Abs: 1.4 K/uL (ref 0.7–4.0)
MCH: 31.4 pg (ref 26.0–34.0)
MCHC: 34.1 g/dL (ref 30.0–36.0)
MCV: 92.1 fL (ref 80.0–100.0)
Monocytes Absolute: 0.9 K/uL (ref 0.1–1.0)
Monocytes Relative: 10 %
Neutro Abs: 6.6 K/uL (ref 1.7–7.7)
Neutrophils Relative %: 73 %
Platelet Count: 348 K/uL (ref 150–400)
RBC: 4.33 MIL/uL (ref 4.22–5.81)
RDW: 16.8 % — ABNORMAL HIGH (ref 11.5–15.5)
WBC Count: 9.1 K/uL (ref 4.0–10.5)
nRBC: 0 % (ref 0.0–0.2)

## 2024-04-22 NOTE — Progress Notes (Signed)
 [facility] [phone]   [phone]  PROGRESS NOTE  Patient Care Team: Mahlon Comer BRAVO, MD as PCP - General (Family Medicine) Court Dorn PARAS, MD as PCP - Cardiology (Cardiology) Dolphus Reiter, MD as Consulting Physician (Rheumatology)  Hematological/Oncological History # Recurrent DVTs 1) 05/2012: Diagnosed with pulmonary embolism and left lower leg DVT. Treated with coumadin. Provoking factor felt to be left ankle fracture and prolonged immobility with car travel. (Records not available for review).   2)04/17/2021: Doppler US  confirmed acute DVT involving the left popliteal and left gastrocnemius vein. Started on Xarelto .    3) 05/02/2021: Established care with [employer] PA-C   Interval History:  Cameron Kim 53 y.o. male with medical history significant for recurrent DVTs who presents for a follow up visit. The patient's last visit was on 02/27/2023. In the interim since the last visit he has had no major changes in his health.  On exam today Cameron Kim reports he has been well and staying healthy in interim since our last visit.  He reports that he does have some stiffness in his hands from with arthritis issues and did have cataracts removed from his eyes on 6/6 and 03/27/2024.  He reports that there is also been some peeling on his fingernails due to the mycophenolate .  He reports his appetite is good though his energy levels have been decreased and he is feeling tired this morning.  He reports that he just pushes through.  He continues taking his Eliquis  5 mg twice daily and is unsure of the monthly cost.  He reports is not causing any bleeding, bruising, or dark stools.  He reports nothing else out of the ordinary.  A full 10 point ROS is otherwise negative.  MEDICAL HISTORY:  Past Medical History:  Diagnosis Date   Abnormal EKG    Ankle fracture, left    DVT (deep venous thrombosis) (HCC)    Dysrhythmia    extra beat or skips a  beat   Idiopathic inflammatory myopathy    Peripheral vascular disease (HCC) 2013   dvt-pe after ankle fx   Polymyositis (HCC)    Pulmonary embolism (HCC) 05/2012   DVT traveled to lung   Seizure disorder (HCC) 11/07/2020   had 2 but none since not on seizure meds    SURGICAL HISTORY: Past Surgical History:  Procedure Laterality Date   CARDIAC CATHETERIZATION  2021   COLONOSCOPY     I & D EXTREMITY Right 06/07/2016   Procedure: IRRIGATION AND DEBRIDEMENT RIGHT WRIST,MEDIAN NERVE EXPLORATION NEUROLYSIS AND REPAIR;  Surgeon: Elsie Mussel, MD;  Location: [facility] OR;  Service: Orthopedics;  Laterality: Right;   medial nerve surgry right arm     MUSCLE BIOPSY Right 07/02/2022   Procedure: RIGHT THIGH MUSCLE BIOPSY;  Surgeon: Curvin Deward MOULD, MD;  Location: [facility] OR;  Service: General;  Laterality: Right;   RIGHT HEART CATH N/A 09/12/2022   Procedure: RIGHT HEART CATH;  Surgeon: Rolan Ezra RAMAN, MD;  Location: [facility] INVASIVE CV LAB;  Service: Cardiovascular;  Laterality: N/A;    SOCIAL HISTORY: Social History   Socioeconomic History   Marital status: Married    Spouse name: Not on file   Number of children: 0   Years of education: Not on file   Highest education level: 12th grade  Occupational History   Occupation: disabled  Tobacco Use   Smoking status: Never    Passive exposure: Never   Smokeless tobacco: Never  Vaping Use   Vaping  status: Never Used  Substance and Sexual Activity   Alcohol use: Not Currently   Drug use: No   Sexual activity: Yes  Other Topics Concern   Not on file  Social History Narrative   Not on file   Social Drivers of Health   Financial Resource Strain: Medium Risk (04/22/2023)   Overall Financial Resource Strain (CARDIA)    Difficulty of Paying Living Expenses: Somewhat hard  Food Insecurity: Food Insecurity Present (04/22/2023)   Hunger Vital Sign    Worried About Running Out of Food in the Last Year: Sometimes true    Ran Out of Food in the Last  Year: Sometimes true  Transportation Needs: No Transportation Needs (04/22/2023)   PRAPARE - Administrator, Civil Service (Medical): No    Lack of Transportation (Non-Medical): No  Physical Activity: Insufficiently Active (04/22/2023)   Exercise Vital Sign    Days of Exercise per Week: 3 days    Minutes of Exercise per Session: 30 min  Stress: Stress Concern Present (04/22/2023)   [employer] of Occupational Health - Occupational Stress Questionnaire    Feeling of Stress : To some extent  Social Connections: Moderately Integrated (04/22/2023)   Social Connection and Isolation Panel    Frequency of Communication with Friends and Family: More than three times a week    Frequency of Social Gatherings with Friends and Family: Once a week    Attends Religious Services: More than 4 times per year    Active Member of [employer] or Organizations: Yes    Attends Banker Meetings: Not on file    Marital Status: Widowed  Catering manager Violence: Not on file    FAMILY HISTORY: Family History  Problem Relation Age of Onset   Diabetes Mother    Hypertension Mother    Heart murmur Mother    Stroke Mother    Diabetes Father    Hypertension Father    ALS Father    Stroke Sister    Rheum arthritis Brother    Diabetes Paternal Grandmother    Diabetes Paternal Grandfather    Colon cancer Neg Hx    Esophageal cancer Neg Hx    Rectal cancer Neg Hx    Stomach cancer Neg Hx     ALLERGIES:  has no known allergies.  MEDICATIONS:  Current Outpatient Medications  Medication Sig Dispense Refill   amLODipine (NORVASC) 5 MG tablet Take 5 mg by mouth daily.     apixaban  (ELIQUIS ) 5 MG TABS tablet Take 1 tablet (5 mg total) by mouth 2 (two) times daily. 60 tablet 3   Cyanocobalamin  (VITAMIN B-12 PO) Take 1 tablet by mouth daily.     doxycycline  (VIBRA -TABS) 100 MG tablet Take 1 tablet (100 mg total) by mouth 2 (two) times daily. 20 tablet 0   FLUoxetine  (PROZAC ) 20 MG  capsule Take 1 capsule (20 mg total) by mouth daily. 30 capsule 3   FOLIC ACID  PO Take 1 tablet by mouth daily.     hydroxychloroquine  (PLAQUENIL ) 200 MG tablet Take 1 tablet (200 mg total) by mouth 2 (two) times daily. 60 tablet 2   nystatin  (MYCOSTATIN /NYSTOP ) powder Apply 1 Application topically 2 (two) times daily. 60 g 3   tadalafil  (CIALIS ) 20 MG tablet Take 0.5-1 tablets (10-20 mg total) by mouth every other day as needed for erectile dysfunction. 10 tablet 11   No current facility-administered medications for this visit.    REVIEW OF SYSTEMS:   Constitutional: ( - )  fevers, ( - )  chills , ( - ) night sweats Eyes: ( - ) blurriness of vision, ( - ) double vision, ( - ) watery eyes Ears, nose, mouth, throat, and face: ( - ) mucositis, ( - ) sore throat Respiratory: ( - ) cough, ( - ) dyspnea, ( - ) wheezes Cardiovascular: ( - ) palpitation, ( - ) chest discomfort, ( - ) lower extremity swelling Gastrointestinal:  ( - ) nausea, ( - ) heartburn, ( - ) change in bowel habits Skin: ( - ) abnormal skin rashes Lymphatics: ( - ) new lymphadenopathy, ( - ) easy bruising Neurological: ( - ) numbness, ( - ) tingling, ( - ) new weaknesses Behavioral/Psych: ( - ) mood change, ( - ) new changes  All other systems were reviewed with the patient and are negative.  PHYSICAL EXAMINATION:  Vitals:   04/22/24 1102  BP: 135/64  Pulse: 74  Resp: 15  Temp: (!) 97.4 F (36.3 C)  SpO2: 95%     Filed Weights   04/22/24 1102  Weight: 246 lb 1.6 oz (111.6 kg)      GENERAL: well appearing middle aged [place] American male. alert, no distress and comfortable SKIN: skin color, texture, turgor are normal, no rashes or significant lesions EYES: conjunctiva are pink and non-injected, sclera clear LUNGS: clear to auscultation and percussion with normal breathing effort HEART: regular rate & rhythm and no murmurs and no lower extremity edema Musculoskeletal: no cyanosis of digits and no clubbing   PSYCH: alert & oriented x 3, fluent speech NEURO: no focal motor/sensory deficits  LABORATORY DATA:  I have reviewed the data as listed    Latest Ref Rng & Units 04/22/2024   10:11 AM 04/08/2024   11:22 AM 09/23/2023   11:08 AM  CBC  WBC 4.0 - 10.5 K/uL 9.1  12.6  15.3   Hemoglobin 13.0 - 17.0 g/dL 86.3  87.5  86.4   Hematocrit 39.0 - 52.0 % 39.9  36.4  42.7   Platelets 150 - 400 K/uL 348  383.0  315.0        Latest Ref Rng & Units 04/22/2024   10:11 AM 04/08/2024   11:22 AM 09/23/2023   11:08 AM  CMP  Glucose 70 - 99 mg/dL 896  79  90   BUN 6 - 20 mg/dL 11  11  17    Creatinine 0.61 - 1.24 mg/dL 9.18  9.11  9.07   Sodium 135 - 145 mmol/L 139  136  139   Potassium 3.5 - 5.1 mmol/L 3.7  4.5  4.0   Chloride 98 - 111 mmol/L 106  99  102   CO2 22 - 32 mmol/L 28  31  27    Calcium 8.9 - 10.3 mg/dL 9.2  9.1  9.0   Total Protein 6.5 - 8.1 g/dL 8.1  9.2  7.5   Total Bilirubin 0.0 - 1.2 mg/dL 0.7  1.8  0.6   Alkaline Phos 38 - 126 U/L 75  72  60   AST 15 - 41 U/L 15  13  16    ALT 0 - 44 U/L 10  9  17      RADIOGRAPHIC STUDIES: No results found.  ASSESSMENT & PLAN Cameron Kim is a 53 y.o. male returns for a follow up for recurrent DVTs.    #Recurrent LE DVT: --First episode in 2013 involved PE and DVT in left lower leg following left ankle fracture and prolonged car travel. --Second  episode occurred on 04/17/2021 whcih involved DVT of left popliteal vein and the gastrocnemius veins. --There is no provoking factor that caused recent DVT on 04/17/2021 so recommend indefinite anticoagulation. --Discontinued Xarelto  around April/May 2023 due to cost.  --Hypercoagulable workup from 05/02/2021 was unremarkable except for mild elevation of anticardiolipin IgM levels. --continue Eliquis  5 mg twice daily --RTC in 6 months   #Leukocytosis-Neutrophilic Predominance #Thrombocytosis-resolved --No infectious symptoms and patient is afebrile today --Sed rate was elevated and positive ANA levels  when PCP checked on 05/11/2022 which is concerning for rheumatological process --labs today show WBC 9.1, Hgb 13.6, MCV 92.1, Plt 348. Cr 0.81  --Currently following with Dr. Dolphus in Rheumatology and [facility]. --had visit with Dr. Geronimo in Pulmonary, concern for interstitial disease with connective tissue disease.    #Normocytic Anemia-Resolved.  --etiologies include inflammatory process, nutritional deficiencies,  --evidence of B12 and folate deficiency confirmed with labs on 02/08/2022.  --Advised to follow up with PCP to recheck levels to determine if he needs to continue.  No orders of the defined types were placed in this encounter.   All questions were answered. The patient knows to call the clinic with any problems, questions or concerns.  A total of more than 30 minutes were spent on this encounter with face-to-face time and non-face-to-face time, including preparing to see the patient, ordering tests and/or medications, counseling the patient and coordination of care as outlined above.   Norleen IVAR Kidney, MD Department of Hematology/Oncology [facility] Phone: [phone] Pager: [phone] Email: [email or web address]  05/03/2024 3:20 PM

## 2024-04-30 DIAGNOSIS — M3320 Polymyositis, organ involvement unspecified: Secondary | ICD-10-CM | POA: Diagnosis not present

## 2024-05-05 DIAGNOSIS — G7249 Other inflammatory and immune myopathies, not elsewhere classified: Secondary | ICD-10-CM | POA: Diagnosis not present

## 2024-05-05 DIAGNOSIS — R809 Proteinuria, unspecified: Secondary | ICD-10-CM | POA: Diagnosis not present

## 2024-05-05 DIAGNOSIS — I129 Hypertensive chronic kidney disease with stage 1 through stage 4 chronic kidney disease, or unspecified chronic kidney disease: Secondary | ICD-10-CM | POA: Diagnosis not present

## 2024-05-05 DIAGNOSIS — N189 Chronic kidney disease, unspecified: Secondary | ICD-10-CM | POA: Diagnosis not present

## 2024-05-15 DIAGNOSIS — M3320 Polymyositis, organ involvement unspecified: Secondary | ICD-10-CM | POA: Diagnosis not present

## 2024-05-28 DIAGNOSIS — M3320 Polymyositis, organ involvement unspecified: Secondary | ICD-10-CM | POA: Diagnosis not present

## 2024-05-29 DIAGNOSIS — M3320 Polymyositis, organ involvement unspecified: Secondary | ICD-10-CM | POA: Diagnosis not present

## 2024-06-04 DIAGNOSIS — B351 Tinea unguium: Secondary | ICD-10-CM | POA: Diagnosis not present

## 2024-06-04 DIAGNOSIS — L309 Dermatitis, unspecified: Secondary | ICD-10-CM | POA: Diagnosis not present

## 2024-06-05 DIAGNOSIS — Z79899 Other long term (current) drug therapy: Secondary | ICD-10-CM | POA: Diagnosis not present

## 2024-06-05 DIAGNOSIS — Z1331 Encounter for screening for depression: Secondary | ICD-10-CM | POA: Diagnosis not present

## 2024-06-05 DIAGNOSIS — M3320 Polymyositis, organ involvement unspecified: Secondary | ICD-10-CM | POA: Diagnosis not present

## 2024-06-05 DIAGNOSIS — M0579 Rheumatoid arthritis with rheumatoid factor of multiple sites without organ or systems involvement: Secondary | ICD-10-CM | POA: Diagnosis not present

## 2024-06-05 NOTE — Progress Notes (Signed)
 This video encounter was conducted with the patient's (or proxy's) verbal consent via secure, interactive audio and video telecommunications while away from clinic/office/hospital.  The patient (or proxy) was instructed to have this encounter in a suitably private space and to only have persons present to whom they give permission to participate. In addition, patient identity was confirmed by use of name plus an additional identifier.  August 18, 2020 E&M) This visit was coded based on time. I spent a total of 40 minutes in both face-to-face and non-face-to-face activities for this visit on the date of this encounter.   Rheumatology Follow Up Clinic Note   Date of visit:06/05/2024  Subjective  HPI:  History of Present Illness Cameron Kim is a 53 year old male who presents for a follow-up on his current treatment regimen.  He is experiencing typical morning arthralgia that improves with activity. He has had a few eczema flare-ups recently, but his skin condition is generally stable. His nails have not changed since the last visit.  He is currently receiving infusions of Gamunex and rituximab  without complications and takes Benadryl  as a precaution due to a previous reaction. He has not had any recent infections, although he had a sore spot on his ankle that resolved without further issues.  He underwent cataract surgery on both eyes and reports no postoperative complications. He has been off steroids for about a year. He is taking mycophenolate , obtained through [facility], with no interruptions in medication adherence. He takes his morning medication at approximately 4:10 AM.  He was prescribed amlodipine for Raynaud's phenomenon but has not used it recently due to the absence of symptoms. He uses clobetasol and another topical cream for his hands, which he finds effective. He previously experienced weakness and difficulty with physical tasks but now reports significant improvement in strength, as  demonstrated by his recent ability to move furniture.  No recent infections or renal issues. No problems with his eyes post-cataract surgery. He has not used amlodipine recently as he has not had any Raynaud's symptoms.   Review of Systems  A 10+ ROS was reviewed. Positive and pertinent negative findings are documented in the HPI, and all the rest are negative PMH/FH/SH reviewed, no changes. Disease Assessment Scores:    10/28/2022    5:56 PM 03/11/2023    8:14 AM 01/22/2024    9:01 AM  Rapid 3 Score Only  Rapid 3 Total Score 4.94 4.11 4.33   Current Outpatient Medications  Medication Sig Dispense Refill  . amLODIPine (NORVASC) 5 MG tablet Take 1 tablet (5 mg total) by mouth once daily 90 tablet 0  . apixaban  (ELIQUIS ) 5 mg tablet Take 5 mg by mouth 2 (two) times daily    . aspirin  81 MG chewable tablet Take 81 mg by mouth once daily    . clobetasoL (TEMOVATE) 0.05 % ointment Apply topically at bedtime 60 g 1  . cyanocobalamin  (VITAMIN B12) 100 MCG tablet Take by mouth once daily    . folic acid  (FOLVITE ) 400 MCG tablet Take by mouth once daily    . hydroxychloroquine  (PLAQUENIL ) 200 mg tablet Take 1 tablet (200 mg total) by mouth 2 (two) times daily 180 tablet 3  . mycophenolate  (CELLCEPT ) 500 mg tablet Take 3 tablets (1,500 mg total) by mouth 2 (two) times daily 180 tablet 3  . sulfamethoxazole -trimethoprim  (BACTRIM  DS) 800-160 mg tablet Take 1 tablet (160 mg of trimethoprim  total) by mouth 3 (three) times a week 36 tablet 3  . urea  (CEROVEL) 40 %  topical cream Apply topically once daily to thickened toenail 28 g 3  . mycophenolate  (CELLCEPT ) 500 mg tablet Take 3 tablets (1,500 mg total) by mouth 2 (two) times daily for 118 days 180 tablet 3   No current facility-administered medications for this visit.     Objective   Physical Exam: Ht 188 cm (6' 2)   Wt (!) 111.6 kg (246 lb) Comment: per pt  BMI 31.58 kg/m     Vitals as reported by patient: Vitals:   06/05/24 0911   Weight: (!) 111.6 kg (246 lb)  Height: 188 cm (6' 2)  PainSc: 0-No pain   Body mass index is 31.58 kg/m.  Constitutional: alert, interactive with provider, cooperative, in no distress Mental status: oriented x 3, good historian, appropriate mood and behavior, thought content appears normal Respiratory: no respiratory distress, no audible wheezing   Labs: Labs in chart were reviewed.  Lab Results  Component Value Date   WBC 17.3 (H) 07/04/2023   HGB 13.1 (L) 07/04/2023   HCT 41.3 07/04/2023   MCV 96 07/04/2023   PLT 317 07/04/2023    Lab Results  Component Value Date   ALT 20 07/04/2023   AST 18 07/04/2023   ALKPHOS 57 07/04/2023   Lab Results  Component Value Date   CREATININE 1.0 07/04/2023   BUN 14 07/04/2023   NA 138 07/04/2023   K 3.4 (L) 07/04/2023   CL 103 07/04/2023   CO2 27 07/04/2023    Lab Results  Component Value Date   TPCRERATIO 107 03/11/2023   PROTEINUA 1+ (!) 03/11/2023   NITRITE Negative 03/11/2023   LEUKOCYTESUR Negative 03/11/2023   RBCUA 1 03/11/2023   WBCUA 1 03/11/2023   SQUAMEPI 0 03/11/2023     Recent Labs    07/04/23 1021  CRP 1.04*     Autoantibody Testing:  Lab Results  Component Value Date   C3 125 03/11/2023   C4 23 03/11/2023    Imaging: Imaging in chart was reviewed.  Assessment & Plan   Assessment & Plan Cameron Kim is a 53 y.o. male who returns to rheumatology clinic for follow-up of Anti-Jo-1 antisynthetase syndrome with polymyositis.  Condition is well-managed with no recent infections or significant issues. He continues to receive Gamunex and rituximab  infusions without problems. No recent flare-ups since the last episode. Off steroids for one year, which is beneficial for cataract management. No gaps in mycophenolate  supply, now obtained through [facility].  1. Rheumatoid arthritis involving multiple sites with positive rheumatoid factor (CMS/HHS-HCC) 2. Jo-1 positive polymyositis (CMS/HHS-HCC) 3. High  risk medication use  -     hydroxychloroquine  (PLAQUENIL ) 200 mg tablet; Take 1 tablet (200 mg total) by mouth 2 (two) times daily -     mycophenolate  (CELLCEPT ) 500 mg tablet; Take 3 tablets (1,500 mg total) by mouth 2 (two) times daily -     amLODIPine (NORVASC) 5 MG tablet; Take 1 tablet (5 mg total) by mouth once daily -     sulfamethoxazole -trimethoprim  (BACTRIM  DS) 800-160 mg tablet; Take 1 tablet (160 mg of trimethoprim  total) by mouth 3 (three) times a week -     continue Rituximab  and IVIG as previously ordered   4. Depression screening (Z13.31)  -     Depression Screen -(PHQ- 2/9, BDI)  Raynaud's phenomenon No recent issues reported. Amlodipine prescribed but not currently needed. Plans to potentially use it as a precautionary measure as the season changes. - Consider starting amlodipine as a precautionary measure during colder months.  Eczema and nail dystrophy Eczema is relatively well-controlled with occasional flare-ups. Nails require more attention, and a prescription cream is needed. Insurance issues have delayed treatment, but plans to obtain the cream are in place. Continues to use clobetasol and other effective treatments as needed. - Obtain and use prescribed cream for nail dystrophy. - Continue using clobetasol for eczema as needed.      The primary encounter diagnosis was Rheumatoid arthritis involving multiple sites with positive rheumatoid factor (CMS/HHS-HCC). Diagnoses of Depression screening (Z13.31), Jo-1 positive polymyositis (CMS/HHS-HCC), and High risk medication use were also pertinent to this visit. Orders Placed This Encounter  Procedures  . Depression Screen -(PHQ- 2/9, BDI)   Requested Prescriptions   Signed Prescriptions Disp Refills  . hydroxychloroquine  (PLAQUENIL ) 200 mg tablet 180 tablet 3    Sig: Take 1 tablet (200 mg total) by mouth 2 (two) times daily  . mycophenolate  (CELLCEPT ) 500 mg tablet 180 tablet 3    Sig: Take 3 tablets (1,500 mg  total) by mouth 2 (two) times daily  . amLODIPine (NORVASC) 5 MG tablet 90 tablet 0    Sig: Take 1 tablet (5 mg total) by mouth once daily  . sulfamethoxazole -trimethoprim  (BACTRIM  DS) 800-160 mg tablet 36 tablet 3    Sig: Take 1 tablet (160 mg of trimethoprim  total) by mouth 3 (three) times a week    Bone health: DEXA deferred at this time Pt will continue with Ca+Vit D supplementation Contraception: Male   Immunizations:    There is no immunization history on file for this patient.  Return in about 6 months (around 12/05/2024) for RA/DM; labs with every other infusion.   Future Appointments    Date/Time Provider Department Center Visit Type   12/04/2024 10:40 AM (Arrive by 10:25 AM) Apte, Poorva Jayant, MD [facility] [place] [facility] VIDEO VISIT RETURN   12/21/2024 9:00 AM (Arrive by 8:45 AM) Apte, Poorva Jayant, MD [facility] [place] [facility]HE[facility] RETURN VISIT       This visit was coded based on time. I spent a total of 40 minutes in both face-to-face and non-face-to-face activities, excluding procedures performed, for this visit on the date of this encounter.   This note has been created using automated tools and reviewed for accuracy by POORVA JAYANT APTE.   Claiborne Camera, MD [facility] Pager: [phone]

## 2024-06-25 DIAGNOSIS — M3320 Polymyositis, organ involvement unspecified: Secondary | ICD-10-CM | POA: Diagnosis not present

## 2024-06-26 DIAGNOSIS — M3320 Polymyositis, organ involvement unspecified: Secondary | ICD-10-CM | POA: Diagnosis not present

## 2024-07-23 DIAGNOSIS — M3320 Polymyositis, organ involvement unspecified: Secondary | ICD-10-CM | POA: Diagnosis not present

## 2024-07-24 DIAGNOSIS — M3320 Polymyositis, organ involvement unspecified: Secondary | ICD-10-CM | POA: Diagnosis not present

## 2024-07-27 ENCOUNTER — Ambulatory Visit (INDEPENDENT_AMBULATORY_CARE_PROVIDER_SITE_OTHER): Admitting: Internal Medicine

## 2024-07-27 ENCOUNTER — Encounter: Payer: Self-pay | Admitting: Internal Medicine

## 2024-07-27 ENCOUNTER — Ambulatory Visit

## 2024-07-27 VITALS — BP 118/74 | HR 68 | Temp 97.9°F | Ht 72.0 in | Wt 237.8 lb

## 2024-07-27 DIAGNOSIS — Z7952 Long term (current) use of systemic steroids: Secondary | ICD-10-CM

## 2024-07-27 DIAGNOSIS — M3391 Dermatopolymyositis, unspecified with respiratory involvement: Secondary | ICD-10-CM | POA: Diagnosis not present

## 2024-07-27 DIAGNOSIS — R0609 Other forms of dyspnea: Secondary | ICD-10-CM

## 2024-07-27 DIAGNOSIS — M359 Systemic involvement of connective tissue, unspecified: Secondary | ICD-10-CM | POA: Diagnosis not present

## 2024-07-27 DIAGNOSIS — R748 Abnormal levels of other serum enzymes: Secondary | ICD-10-CM

## 2024-07-27 DIAGNOSIS — J8489 Other specified interstitial pulmonary diseases: Secondary | ICD-10-CM

## 2024-07-27 DIAGNOSIS — M3320 Polymyositis, organ involvement unspecified: Secondary | ICD-10-CM

## 2024-07-27 DIAGNOSIS — D849 Immunodeficiency, unspecified: Secondary | ICD-10-CM

## 2024-07-27 DIAGNOSIS — Z2882 Immunization not carried out because of caregiver refusal: Secondary | ICD-10-CM

## 2024-07-27 LAB — PULMONARY FUNCTION TEST
DL/VA % pred: 70 %
DL/VA: 3.06 ml/min/mmHg/L
DLCO unc % pred: 47 %
DLCO unc: 14.64 ml/min/mmHg
FEF 25-75 Pre: 2.95 L/s
FEF2575-%Pred-Pre: 84 %
FEV1-%Pred-Pre: 69 %
FEV1-Pre: 2.86 L
FEV1FVC-%Pred-Pre: 105 %
FEV6-%Pred-Pre: 68 %
FEV6-Pre: 3.51 L
FEV6FVC-%Pred-Pre: 103 %
FVC-%Pred-Pre: 66 %
FVC-Pre: 3.52 L
Pre FEV1/FVC ratio: 81 %
Pre FEV6/FVC Ratio: 100 %

## 2024-07-27 LAB — CK: Total CK: 83 U/L (ref 17–232)

## 2024-07-27 LAB — BASIC METABOLIC PANEL WITH GFR
BUN: 15 mg/dL (ref 6–23)
CO2: 28 meq/L (ref 19–32)
Calcium: 8.6 mg/dL (ref 8.4–10.5)
Chloride: 101 meq/L (ref 96–112)
Creatinine, Ser: 0.72 mg/dL (ref 0.40–1.50)
GFR: 104.13 mL/min (ref >60.00)
Glucose, Bld: 80 mg/dL (ref 70–99)
Potassium: 3.8 meq/L (ref 3.5–5.1)
Sodium: 137 meq/L (ref 135–145)

## 2024-07-27 LAB — CBC WITH DIFFERENTIAL/PLATELET
Basophils Absolute: 0.1 K/uL (ref 0.0–0.1)
Basophils Relative: 1.2 % (ref 0.0–3.0)
Eosinophils Absolute: 0 K/uL (ref 0.0–0.7)
Eosinophils Relative: 0.3 % (ref 0.0–5.0)
HCT: 35.5 % — ABNORMAL LOW (ref 39.0–52.0)
Hemoglobin: 12.2 g/dL — ABNORMAL LOW (ref 13.0–17.0)
Lymphocytes Relative: 15.6 % (ref 12.0–46.0)
Lymphs Abs: 1.7 K/uL (ref 0.7–4.0)
MCHC: 34.3 g/dL (ref 30.0–36.0)
MCV: 90.8 fl (ref 78.0–100.0)
Monocytes Absolute: 1.2 K/uL — ABNORMAL HIGH (ref 0.1–1.0)
Monocytes Relative: 11 % (ref 3.0–12.0)
Neutro Abs: 8 K/uL — ABNORMAL HIGH (ref 1.4–7.7)
Neutrophils Relative %: 71.9 % (ref 43.0–77.0)
Platelets: 304 K/uL (ref 150.0–400.0)
RBC: 3.91 Mil/uL — ABNORMAL LOW (ref 4.22–5.81)
RDW: 14.7 % (ref 11.5–15.5)
WBC: 11.1 K/uL — ABNORMAL HIGH (ref 4.0–10.5)

## 2024-07-27 LAB — BRAIN NATRIURETIC PEPTIDE: Pro B Natriuretic peptide (BNP): 33 pg/mL (ref 0.0–100.0)

## 2024-07-27 LAB — HEPATIC FUNCTION PANEL
ALT: 9 U/L (ref 0–53)
AST: 13 U/L (ref 0–37)
Albumin: 3.9 g/dL (ref 3.5–5.2)
Alkaline Phosphatase: 65 U/L (ref 39–117)
Bilirubin, Direct: 0.3 mg/dL (ref 0.0–0.3)
Total Bilirubin: 1.4 mg/dL — ABNORMAL HIGH (ref 0.2–1.2)
Total Protein: 8.8 g/dL — ABNORMAL HIGH (ref 6.0–8.3)

## 2024-07-27 NOTE — Progress Notes (Signed)
 OV 06/26/2022  Subjective:  Patient ID: Cameron Kim, male , DOB: 08/17/1971 , age 53 y.o. , MRN: [id number] , ADDRESS: [address] [place] [address] PCP Cameron Comer BRAVO, MD Patient Care Team: Cameron Comer BRAVO, MD as PCP - General (Family Medicine) Cameron Dorn PARAS, MD as PCP - Cardiology (Cardiology)  This Provider for this visit: Treatment Team:  Attending Provider: Geronimo Amel, MD    06/26/2022 -   Chief Complaint  Patient presents with   Consult    Wheezing/ cough      HPI Cameron Kim 53 y.o. -referral from the rheumatology Cameron Kim  who he saw for the first time on 05/31/2022.  He has been diagnosed with elevated CK and Jo 1 positive polymyositis.  He used to work in [employer].  He has had COVID booster mRNA 3 times.  He had COVID in November 2022 and sometime after that started developing symptoms of weakness of the proximal muscles.  This also included dysphagia, dysphonia and dysarthria after he saw rheumatology on 05/31/2022 he was placed on prednisone  40 mg twice daily.  After this is significantly improved.  His results were elevated CK which is improving.  Jo 1 antibody positive positive ANA.  QuantiFERON gold indeterminate.  He has not been referred to [facility] where he has an upcoming appointment.  In the last few to several months he is also noticing cough with some wheezing.  He is also choking on saliva.  There is no shortness of breath per se but then he is weak and is not able to walk too much.  He is not disabled from his work.  He did have a CT scan of the abdomen 06/08/2022 in the lung images do show abnormalities.  However walking desaturation test today with this is normal.  He does not have echocardiogram.  He does not have pulmonary function test.  There is no high-resolution CT chest.    [facility] consult 9/122/23 Dr Anselm      No data to display         Chief Complaint  Patient presents with   Polymyositis   Cameron Kim is a 53 y.o. male who presents for evaluation of longstanding anti-Jo-1 antibody positive inflammatory myositis. The patient is accompanied by his wife, Cameron Kim.  Beginning in 08/2021, the patient noticed decreased strength which worsened to the point he was unable to move or sit up in bed. He saw his primary care provider in 02/2022 and has seen multiple physicians since. He recently saw a rheumatologist locally who diagnosed him with polymyositis and referred him here to [facility]. [name] also indicates he has difficulty swallowing and his voice has changed. He notes a weight loss of 48 pounds in 9 months. He also mentions he had COVID-19 around the same time. The patient endorses a rash which he feels looks like ringworm with extreme itchiness and flakiness.   The patient has a history of a torn rotator cuff but was denied surgery due to a protein abnormality. Once he was provided steroids on 06/01/2022, the patient improved overall. He continues to have stiffness which impacts his lifestyle.   The patient has had numerous tests including DEXA scan, upper abdominal exam, and is scheduled for a biopsy next week. He has noticed increased moodiness and appetite with prednisone . He also indicates he has insomnia. His prednisone  taper was started at 80 mg and currently he continues on 60 mg daily.  The only joint pain [name] notes currently is hand pain with inability to fully close his hands. Prior to prednisone , he had all over joint pain.   Family History: Father diagnosed with ALS. Social History: He worked in [employer]. Past medical history:  - Blood clots in 2013 and 2022, currently on blood thinners.    A/P  Cameron Kim is a 53 y.o. male with likely an overlap disease with anti-Jo-1 antibody, Ro antibodies, rheumatoid factor, and CCP positivity.   He has signs and symptoms classic for dermatomyositis with history of heliotropic rash, mechanics hands,  Gottron's papules, proximal muscle weakness, and inflammatory arthritis, currently on prednisone  60 mg. He is planning to get tapered with his local rheumatologist down to 50 mg in the next few weeks.   He is scheduled for a muscle biopsy on Monday, and he will plan to start CellCept  on Tuesday after he undergoes the muscle biopsy. We discussed the risks and benefits of CellCept , azathioprine, methotrexate, IVIG and for now the goal will be to start the CellCept  and titrate it up slowly until he can tolerate 1500 mg twice daily.  1. Jo-1 positive polymyositis (CMS-HCC) 2. High risk medication use 3. Seropositive RA - mycophenolate  (CELLCEPT ) 500 mg tablet; Take 1 tablet (500 mg total) by mouth 2 (two) times daily for 14 days, THEN 2 tablets (1,000 mg total) 2 (two) times daily for 14 days, THEN 3 tablets (1,500 mg total) 2 (two) times daily for 90 days. - continue prednisone  60 mg for now, follow taper as suggested by local rheumatologist   OV 08/28/2022  Subjective:  Patient ID: Cameron Kim, male , DOB: 09-03-1971 , age 53 y.o. , MRN: [id number] , ADDRESS: [address] [place] [address] PCP Cameron Comer BRAVO, MD Patient Care Team: Cameron Comer BRAVO, MD as PCP - General (Family Medicine) Cameron Dorn PARAS, MD as PCP - Cardiology (Cardiology) Cameron Reiter, MD as Consulting Physician (Rheumatology)  This Provider for this visit: Treatment Team:  Attending Provider: Geronimo Amel, MD    08/28/2022 -   Chief Complaint  Patient presents with   Follow-up    Pt states he has been doing okay since last visit.     HPI Cameron Kim 53 y.o. -returns for follow-up.  Since last seeing me he has seen Dr. Anselm at [facility] rheumatology.  Her notes were reviewed and copied and pasted above.  She is diagnosed with combination of dermatomyositis/polymyositis with seropositive rheumatoid arthritis.  She started him on CellCept  along with Bactrim .  High-dose  prednisone  which is being tapered now by local rheumatologist Dr Phillips.  He is beginning to feel better.  His muscle strength is improved.  He did have interstitial lung disease workup.  He had an echocardiogram that shows enlarged pulmonary arteries with suggestion of pulmonary hypertension.  He also had high-resolution d discuss the implications of the findings.  He recollects a left heart catheterization a few years ago at [facility] when he was incarcerated.  But it does not sound like he has had a right heart catheterization.   His current symptoms are as below   No results found for: NITRICOXIDE  Latest Reference Range & Units 05/31/22 11:02 06/19/22 16:30 07/31/22 08:40  CK Total 44 - 196 U/L 3,806 (H) 475 (H) 265 (H)     OV 11/06/2022  Subjective:  Patient ID: Cameron Kim, male , DOB: May 23, 1971 , age 14 y.o. , MRN: [id number] , ADDRESS: [address] [place] [address] PCP Cameron Comer BRAVO, MD Patient Care Team: Cameron Comer BRAVO, MD as PCP - General (Family Medicine) Cameron Dorn PARAS, MD as PCP - Cardiology (Cardiology) Cameron Reiter, MD as Consulting Physician (Rheumatology)  This Provider for this visit: Treatment Team:  Attending Provider: Geronimo Amel, MD    11/06/2022 -   Chief Complaint  Patient presents with   Follow-up    PFT done today. Breathing is unchanged since the last visit.      HPI Valentine Kim 53 y.o. -he is here for follow-up.  After the last visit I emailed [facility] rheumatology Dr. Anselm and she emailed thought that 2 doses of Rituxan could be potential benefit for him in stabilizing his ILD.  He did see the other local [facility] rheumatology PA on 10/18/2022.  Was noted that he was taking Plaquenil , CellCept  100 mg 3 tablets by mouth twice daily along with Bactrim  and prednisone  30 mg.  It was noted that he did not have any side effects..  He reported some arthralgia.  And also continued intermittent  rashes as well as increased frequency of Raynaud's.  However CK was elevated to 1100 [personally reviewed] [facility] rheumatology preferred that [facility] rheumatology take over his care.  On his visit to [facility] rheumatology Dr. Anselm on 10/29/2022 he noticed that his skin was worsening and experiencing dryness.  There was some suspicion of a hand infection and he did get antibiotic doxycycline  prior to that visit.  Rheumatology at [facility] felt that his disease was not in remission.  She gave him handout on methotrexate.  Low-dose of amlodipine for the worsening Raynaud's was prescribed.  She also decided to start IVIG because of elevations in CK despite CellCept  and Plaquenil  and high-dose prednisone .  She personally emailed me today saying that current plan is for him to get IVIG at [facility]  1 mg/kg x2 days at ideal weight, which made the dose 80 mg, x2.  In the work she would consider IV Rituxan.    Today he tells me that he is now taking prednisone  40 mg/day since approximately 10/14/2022 since his CK was elevated.  Also for the last week or so since seeing [facility] rheumatology he is on amlodipine.  So far he is not sure things have improved particularly in the foot over the Raynaud's.  He is open to getting CK checked  Of note he did have right heart catheterization by Dr. Ezra Shuck in December 2023.  He does have pulmonary artery pressures bordeline elevated but his pulmonary vascular resistance is less than 3. PCWP 12, MAP 25 and PVR < 2  From a respiratory standpoint he is stable and walking desaturation test is stable and pulmonary function test is stable records suggesting that his ILD is actually still stable.  Although given the elevated CK he is at risk of progression in the future.   OV 03/11/2023  Subjective:  Patient ID: Cameron Kim, male , DOB: 08-12-1971 , age 29 y.o. , MRN: [id number] , ADDRESS: [address] [place] [address] PCP Cameron Comer BRAVO,  MD Patient Care Team: Cameron Comer BRAVO, MD as PCP - General (Family Medicine) Cameron Dorn PARAS, MD as PCP - Cardiology (Cardiology) Cameron Reiter, MD as Consulting Physician (Rheumatology)  This Provider for this visit: Treatment Team:  Attending Provider: Geronimo Amel, MD    03/11/2023 -  No chief complaint on file.    HPI Cameron Kim 53 y.o. -  OV 05/03/2023  Subjective:  Patient ID: Cameron Kim, male , DOB: 1971/05/23 , age 64 y.o. , MRN: [id number] , ADDRESS: [address] [place] [address] PCP Cameron Comer BRAVO, MD Patient Care Team: Cameron Comer BRAVO, MD as PCP - General (Family Medicine) Cameron Dorn PARAS, MD as PCP - Cardiology (Cardiology) Cameron Reiter, MD as Consulting Physician (Rheumatology)  This Provider for this visit: Treatment Team:  Attending Provider: Geronimo Amel, MD    05/03/2023 -   Chief Complaint  Patient presents with   Follow-up    F/up on PFT      HPI Cameron Kim 53 y.o. -returns for follow-up.  He missed his appointment with me in June 2024 because of multiple other appointments.  Last seen January 2024.  Since then he is continue to CellCept  daily prednisone , Plaquenil  and Bactrim  for PCP prophylaxis.  Based on his history and review of the external medical records from [facility] he did get IVIG and also recently Rituxan [see above].  He believes his muscle strength is improved.  In fact he was able to do sit/stand test 15 times easily without any issues.  His last CK that they have either with myself or at [facility] is in March 2024 and it shows improvement but still elevated [see below].  At [facility] he did have a CRP that was still elevated.  Despite all this he is continuing to experience increasing shortness of breath in the last few months.  Of note he had 100 pound weight gain in the last 1 year.  He did show a tendency to desaturate with a sit/stand test today  although it was very transient to 87%.  And he was only mildly dyspneic.  Nevertheless he is worried about disease progression.    OV 09/24/2023  Subjective:  Patient ID: Cameron Kim, male , DOB: 1971-07-17 , age 89 y.o. , MRN: [id number] , ADDRESS: [address] [place] [address] PCP Cameron Comer BRAVO, MD Patient Care Team: Cameron Comer BRAVO, MD as PCP - General (Family Medicine) Cameron Dorn PARAS, MD as PCP - Cardiology (Cardiology) Cameron Reiter, MD as Consulting Physician (Rheumatology)  This Provider for this visit: Treatment Team:  Attending Provider: Geronimo Amel, MD  09/24/2023 -   Chief Complaint  Patient presents with   Follow-up    Ct f/u, pt states breathing has been okay      HPI Cameron Kim 53 y.o. -presents for follow-up.  At the last visit we ruled out pulmonary embolism.  Currently is feeling stable.  He continues his immunomodulatory treatment.  His strength is better he is able to do a lot more squats.  His rheumatologist planning to wean him off prednisone .  I reviewed the external medical record he continues to get IVIG infusions but it has been a while since he saw Dr. Orvilla.  His symptom score is stable.  Pulmonary function test the FVC is improved but the DLCO is stable.  He had a high-resolution CT chest.  Official report is pending because of radiology shortage.  But my personal visualization of the ILD is the same/slightly better.  His exercise hypoxemia test shows slight desaturation but it is also stable.  We did overnight pulse oximetry.  He says he has done it 2 times but I do not have the results.  Will try to get the old results.  I offered him flu shot but he declined.  He does not want to  have the flu shot and RSV shot.  He says people get sick after getting it.  I did warn him about slow COVID in the setting of Rituxan and to take antivirals if he ever gets COVID.  He had labs yesterday and is normal.  He is willing to  get CRP and total CK today.  He did 2 overnight pulse oximetry is in the summer 2024 but both were abnormal.  We did not get both results.  I have to order 1 now.      CT Chest data from date: *09/1123  - personally visualized and independently interpreted : YES, results official NA - my findings are: -Stable interstitial lung disease versus slightly better.   OV 07/27/2024  Subjective:  Patient ID: Cameron Kim, male , DOB: 12/10/1970 , age 72 y.o. , MRN: [id number] , ADDRESS: [address] [place] [address] PCP Cameron Comer BRAVO, MD Patient Care Team: Cameron Comer BRAVO, MD as PCP - General (Family Medicine) Cameron Dorn PARAS, MD as PCP - Cardiology (Cardiology) Cameron Reiter, MD as Consulting Physician (Rheumatology)  This Provider for this visit: Treatment Team:  Attending Provider: Geronimo Amel, MD    07/27/2024 -   Chief Complaint  Patient presents with   Interstitial Lung Disease    Pt states breathing getting better, exercising helps out PFT F/U SOB w/ exertion Occasionally has Dry and Prod cough ( phlegm clear)       polymyositis and probable UIP/ILD on CT scan last CT scan September 2023.   treatment plan of Dr[facility] Anselm at [facility]  -CellCept   -Daily prednisone  40 mg/day: Off this since December 2024.  - bactrim ,  - plaqueni  -Status post cycle 1 Rituxan 05/02/2023 and every 6 months after that -> most recent per him Aug 2025   -gets it in [place] outside [employer]  -IVIG per history given end of June 2024 and then second cycle early May 10, 2023 -> last dose Oct 2025   - get it in [place] outside [employer]   Echo in fall 2023 with elevation of pulmonary artery pressures. No PE Aug 2024   HPI Cameron Kim 52 y.o. -returns for follow-up.  Is actually been almost a year since I last saw him.  Last saw him 2 at the end of 2024.  He himself is surprised that it has been that long.  He says he has lost weight.  This  maintained his health.  He is now down to 237 pounds he was 270 pounds.  He is exercising and following a good diet.  No hospitalizations no emergency room visits no surgeries but he did fall recently and bruised his left hip and is iced it and is getting some better.  He continues his immunosuppression with CellCept , Rituxan, Plaquenil , Bactrim .  He is followed with Dr. Anselm at [facility] and I did review her external medical records from 06/05/2024    His symptoms, pulmonary function test and exercise hypoxemia test are all stable. Most recent doses of his immunosuppressive medications are updated above. He rejected flu and other resp vaccines   SYMPTOM SCALE - ILD 08/28/2022 11/06/2022  05/03/2023  09/24/2023  07/27/2024   Current weight  Cellcept  , pred, plaquenil , bactrim  CellCept , prednisone , Plaquenil , Bactrim  IVIG June 2024 and Rituxan July 2024  130 kg CellCept , prednisone , Plaquenil , Bactrim , IVIG and Rituxan  He will come off prednisone  as of September 29, 2023 room air CellCept , Plaquenil , Bactrim , IVIG and  Rituxan  Has lost weight  O2 use ra  ra  Room air  Shortness of Breath 0 -> 5 scale with 5 being worst (score 6 If unable to do)      At rest 0 0 0 0 0  Simple tasks - showers, clothes change, eating, shaving 2 0 1 1 0  Household (dishes, doing bed, laundry) 2 2 3  1   Shopping 2 0 2  1  Walking level at own pace 2 1 3   0  Walking up Stairs 3 1 4 3 2   Total (30-36) Dyspnea Score 11 4 13  4   How bad is your cough? 0 0 0 1 1  How bad is your fatigue 3 1 1 2 1   How bad is nausea 2 0 0 0 0  How bad is vomiting?  0 0 0 0 0  How bad is diarrhea? 3 0 0 0 0  How bad is anxiety? 2 1 3  0 0  How bad is depression 0 0 0 0 0  Any chronic pain - if so where and how bad x   00 0           SIT STAND TEST - goal 15 times   07/27/2024    O2 used ra   PRobe - finter or forehead finger   Number sit and stand completed - goal 15 33 sec   Time taken to complete 15 ties    Resting Pulse Ox/HR/Dyspnea  98% and 76/min and dyspnea of 0/10    Peak measures 97 % and 96/min and dyspnea of 2/10   Final Pulse Ox/HR 96% and 85/min and dyspnea of 0.5/10   Desaturated </= 88% no   Desaturated <= 3% points no   Got Tachycardic >/= 90/min yes   Miscellaneous comments fine     PFT     Latest Ref Rng & Units 07/27/2024    8:09 AM 08/16/2023   10:00 AM 03/01/2023   10:59 AM 11/06/2022   12:07 PM 08/10/2022    8:52 AM  PFT Results  FVC-Pre L 3.52  P 3.29  2.89  3.03  2.89   FVC-Predicted Pre % 66  P 61  54  56  53   FVC-Post L     2.95   FVC-Predicted Post %     54   Pre FEV1/FVC % % 81  P 80  81  83  84   Post FEV1/FCV % %     84   FEV1-Pre L 2.86  P 2.62  2.35  2.51  2.43   FEV1-Predicted Pre % 69  P 63  56  60  58   FEV1-Post L     2.49   DLCO uncorrected ml/min/mmHg 14.64  P 14.41  15.97  15.74  14.99   DLCO UNC% % 47  P 46  51  51  48   DLCO corrected ml/min/mmHg  14.45  15.93  15.49  14.87   DLCO COR %Predicted %  46  51  50  47   DLVA Predicted % 70  P 74  88  79  86   TLC L     4.20   TLC % Predicted %     56   RV % Predicted %     64     P Preliminary result       LAB RESULTS last 96 hours No results found.       has  a past medical history of Abnormal EKG, Ankle fracture, left, DVT (deep venous thrombosis) (HCC), Dysrhythmia, Idiopathic inflammatory myopathy, Peripheral vascular disease (2013), Polymyositis (HCC), Pulmonary embolism (HCC) (05/2012), and Seizure disorder (HCC) (11/07/2020).   reports that he has never smoked. He has never been exposed to tobacco smoke. He has never used smokeless tobacco.  Past Surgical History:  Procedure Laterality Date   CARDIAC CATHETERIZATION  2021   COLONOSCOPY     I & D EXTREMITY Right 06/07/2016   Procedure: IRRIGATION AND DEBRIDEMENT RIGHT WRIST,MEDIAN NERVE EXPLORATION NEUROLYSIS AND REPAIR;  Surgeon: Elsie Mussel, MD;  Location: [facility] OR;  Service: Orthopedics;  Laterality: Right;   medial  nerve surgry right arm     MUSCLE BIOPSY Right 07/02/2022   Procedure: RIGHT THIGH MUSCLE BIOPSY;  Surgeon: Curvin Deward MOULD, MD;  Location: [facility] OR;  Service: General;  Laterality: Right;   RIGHT HEART CATH N/A 09/12/2022   Procedure: RIGHT HEART CATH;  Surgeon: Rolan Ezra RAMAN, MD;  Location: [facility] INVASIVE CV LAB;  Service: Cardiovascular;  Laterality: N/A;    No Known Allergies  Immunization History  Administered Date(s) Administered   Influenza,inj,Quad PF,6+ Mos 09/01/2018   Moderna SARS-COV2 Booster Vaccination 11/05/2020   PPD Test 07/18/2022   Tdap 10/08/2004, 12/03/2014    Family History  Problem Relation Age of Onset   Diabetes Mother    Hypertension Mother    Heart murmur Mother    Stroke Mother    Diabetes Father    Hypertension Father    ALS Father    Stroke Sister    Rheum arthritis Brother    Diabetes Paternal Grandmother    Diabetes Paternal Grandfather    Colon cancer Neg Hx    Esophageal cancer Neg Hx    Rectal cancer Neg Hx    Stomach cancer Neg Hx      Current Outpatient Medications:    amLODipine (NORVASC) 5 MG tablet, Take 5 mg by mouth daily., Disp: , Rfl:    apixaban  (ELIQUIS ) 5 MG TABS tablet, Take 1 tablet (5 mg total) by mouth 2 (two) times daily., Disp: 60 tablet, Rfl: 3   Cyanocobalamin  (VITAMIN B-12 PO), Take 1 tablet by mouth daily., Disp: , Rfl:    FOLIC ACID  PO, Take 1 tablet by mouth daily., Disp: , Rfl:    hydroxychloroquine  (PLAQUENIL ) 200 MG tablet, Take 1 tablet (200 mg total) by mouth 2 (two) times daily., Disp: 60 tablet, Rfl: 2   tadalafil  (CIALIS ) 20 MG tablet, Take 0.5-1 tablets (10-20 mg total) by mouth every other day as needed for erectile dysfunction., Disp: 10 tablet, Rfl: 11   doxycycline  (VIBRA -TABS) 100 MG tablet, Take 1 tablet (100 mg total) by mouth 2 (two) times daily. (Patient not taking: Reported on 07/27/2024), Disp: 20 tablet, Rfl: 0   FLUoxetine  (PROZAC ) 20 MG capsule, Take 1 capsule (20 mg total) by mouth daily.  (Patient not taking: Reported on 07/27/2024), Disp: 30 capsule, Rfl: 3   mycophenolate  (CELLCEPT ) 500 MG tablet, Take by mouth., Disp: , Rfl:    nystatin  (MYCOSTATIN /NYSTOP ) powder, Apply 1 Application topically 2 (two) times daily. (Patient not taking: Reported on 07/27/2024), Disp: 60 g, Rfl: 3      Objective:   Vitals:   07/27/24 0851  BP: 118/74  Pulse: 68  Temp: 97.9 F (36.6 C)  TempSrc: Oral  SpO2: 97%  Weight: 237 lb 12.8 oz (107.9 kg)  Height: 6' (1.829 m)    Estimated body mass index is 32.25 kg/m as calculated  from the following:   Height as of this encounter: 6' (1.829 m).   Weight as of this encounter: 237 lb 12.8 oz (107.9 kg).  @WEIGHTCHANGE @  [employer]   07/27/24 0851  Weight: 237 lb 12.8 oz (107.9 kg)     Physical Exam   General: No distress. Looks well O2 at rest: lost weight. No o2 Cane present: no Sitting in wheel chair: no Frail: no Obese: yes Neuro: Alert and Oriented x 3. GCS 15. Speech normal Psych: Pleasant Resp:  Barrel Chest - no.  Wheeze - no, Crackles - no, No overt respiratory distress CVS: Normal heart sounds. Murmurs - no Ext: Stigmata of Connective Tissue Disease - no HEENT: Normal upper airway. PEERL +. No post nasal drip        Assessment/     Assessment & Plan Interstitial lung disease due to connective tissue disease (HCC)  Dermatomyositis affecting respiratory system (HCC)  Jo-1 positive polymyositis (HCC)  Current chronic use of systemic steroids  Immunosuppressed status  Elevated CK  DOE (dyspnea on exertion)  Vaccine refused by parent    PLAN Patient Instructions     ICD-10-CM   1. Interstitial lung disease due to connective tissue disease (HCC)  J84.89    M35.9     2. Dermatomyositis affecting respiratory system (HCC)  M33.91     3. Jo-1 positive polymyositis (HCC)  M33.20     4. Current chronic use of systemic steroids  Z79.52     5. Immunosuppressed status  D84.9     6. Elevated CK   R74.8       Last seen in December 2024.  Since then doing clinically really well.  Muscle strength on breathing test has improved.  I believe the scar tissue is still there but it is likely stable.  Last CT scan of the chest was in December 2024.  Last dose prednisone  December 2024  Last echo was in 2023 and there was suggestion of elevated pulmonary artery pressures at that time.  Plan -Check blood CK today, liver function test CBC and chemistry and BNP today - Do high-resolution CT chest in 6 months -Do echocardiogram in 6 months Continue-continue to lose weight -resepect no  RSV vaccine, COVID-vaccine and shingles vaccine and flu shot -per your personal decision - Any progression of pulmonary fibrosis then we will consider antifibrotic   Follow-up - Return to see Dr. Geronimo for a 15-minute visit in 6 months but after high-resolution CT chest    FOLLOWUP    Return for  - Return to see Dr. Geronimo for a 15-minute visit in 6 months but after high-resolution CT chest.    SIGNATURE    Dr. Dorethia Cameron, M.D., F.C.C.P,  Pulmonary and Critical Care Medicine Staff Physician, [facility] Director - Interstitial Lung Disease  Program  Pulmonary Fibrosis [facility] at [facility] [place], [place], [address]  Pager: [phone], If no answer or between  15:00h - 7:00h: call [phone] Telephone: [phone]  9:21 AM 07/27/2024

## 2024-07-27 NOTE — Patient Instructions (Addendum)
    ICD-10-CM   1. Interstitial lung disease due to connective tissue disease (HCC)  J84.89    M35.9     2. Dermatomyositis affecting respiratory system (HCC)  M33.91     3. Jo-1 positive polymyositis (HCC)  M33.20     4. Current chronic use of systemic steroids  Z79.52     5. Immunosuppressed status  D84.9     6. Elevated CK  R74.8       Last seen in December 2024.  Since then doing clinically really well.  Muscle strength on breathing test has improved.  I believe the scar tissue is still there but it is likely stable.  Last CT scan of the chest was in December 2024.  Last dose prednisone  December 2024  Last echo was in 2023 and there was suggestion of elevated pulmonary artery pressures at that time.  Plan -Check blood CK today, liver function test CBC and chemistry and BNP today - Do high-resolution CT chest in 6 months -Do echocardiogram in 6 months Continue-continue to lose weight -resepect no  RSV vaccine, COVID-vaccine and shingles vaccine and flu shot -per your personal decision - Any progression of pulmonary fibrosis then we will consider antifibrotic   Follow-up - Return to see Dr. Geronimo for a 15-minute visit in 6 months but after high-resolution CT chest

## 2024-07-27 NOTE — Patient Instructions (Signed)
Spiro/DLCO performed today. 

## 2024-07-27 NOTE — Progress Notes (Signed)
Spiro/DLCO performed today. 

## 2024-08-03 ENCOUNTER — Ambulatory Visit (HOSPITAL_COMMUNITY)

## 2024-08-05 ENCOUNTER — Ambulatory Visit (HOSPITAL_COMMUNITY)
Admission: RE | Admit: 2024-08-05 | Discharge: 2024-08-05 | Disposition: A | Discharge status: Home / Self Care | Source: Ambulatory Visit | Attending: Cardiology | Admitting: Cardiology

## 2024-08-05 DIAGNOSIS — Z7952 Long term (current) use of systemic steroids: Secondary | ICD-10-CM | POA: Insufficient documentation

## 2024-08-05 DIAGNOSIS — Z2882 Immunization not carried out because of caregiver refusal: Secondary | ICD-10-CM | POA: Diagnosis not present

## 2024-08-05 DIAGNOSIS — D849 Immunodeficiency, unspecified: Secondary | ICD-10-CM | POA: Insufficient documentation

## 2024-08-05 DIAGNOSIS — M359 Systemic involvement of connective tissue, unspecified: Secondary | ICD-10-CM | POA: Diagnosis not present

## 2024-08-05 DIAGNOSIS — R0609 Other forms of dyspnea: Secondary | ICD-10-CM | POA: Diagnosis not present

## 2024-08-05 DIAGNOSIS — M3320 Polymyositis, organ involvement unspecified: Secondary | ICD-10-CM | POA: Diagnosis not present

## 2024-08-05 DIAGNOSIS — M3391 Dermatopolymyositis, unspecified with respiratory involvement: Secondary | ICD-10-CM | POA: Diagnosis not present

## 2024-08-05 DIAGNOSIS — R748 Abnormal levels of other serum enzymes: Secondary | ICD-10-CM | POA: Insufficient documentation

## 2024-08-05 DIAGNOSIS — J8489 Other specified interstitial pulmonary diseases: Secondary | ICD-10-CM | POA: Diagnosis not present

## 2024-08-05 DIAGNOSIS — I5030 Unspecified diastolic (congestive) heart failure: Secondary | ICD-10-CM | POA: Diagnosis not present

## 2024-08-05 LAB — ECHOCARDIOGRAM COMPLETE
AR max vel: 3.62 cm2
AV Area VTI: 3.64 cm2
AV Area mean vel: 3.38 cm2
AV Mean grad: 7 mmHg
AV Peak grad: 12.8 mmHg
Ao pk vel: 1.79 m/s
Area-P 1/2: 3.72 cm2
S' Lateral: 4.06 cm

## 2024-08-19 ENCOUNTER — Ambulatory Visit: Payer: Self-pay | Admitting: Internal Medicine

## 2024-08-19 NOTE — Telephone Encounter (Signed)
 Echo still showing evience of mil dpulmonary hyperrtension like last year. Mgith need right heart cath. Please give first available appt to discuss this withme or an APP. Video visit ok.

## 2024-08-20 DIAGNOSIS — M3320 Polymyositis, organ involvement unspecified: Secondary | ICD-10-CM | POA: Diagnosis not present

## 2024-08-21 DIAGNOSIS — M3320 Polymyositis, organ involvement unspecified: Secondary | ICD-10-CM | POA: Diagnosis not present

## 2024-09-17 DIAGNOSIS — M3320 Polymyositis, organ involvement unspecified: Secondary | ICD-10-CM | POA: Diagnosis not present

## 2024-09-18 DIAGNOSIS — M3320 Polymyositis, organ involvement unspecified: Secondary | ICD-10-CM | POA: Diagnosis not present

## 2024-10-22 ENCOUNTER — Other Ambulatory Visit: Payer: Self-pay | Admitting: Physician Assistant

## 2024-10-22 DIAGNOSIS — Z86718 Personal history of other venous thrombosis and embolism: Secondary | ICD-10-CM

## 2024-10-22 NOTE — Progress Notes (Unsigned)
 " [facility] [phone]   [phone]  PROGRESS NOTE  Patient Care Team: Mahlon Comer BRAVO, MD as PCP - General (Family Medicine) Court Dorn PARAS, MD as PCP - Cardiology (Cardiology) Dolphus Reiter, MD as Consulting Physician (Rheumatology)  Hematological/Oncological History # Recurrent DVTs 1) 05/2012: Diagnosed with pulmonary embolism and left lower leg DVT. Treated with coumadin. Provoking factor felt to be left ankle fracture and prolonged immobility with car travel. (Records not available for review).   2)04/17/2021: Doppler US  confirmed acute DVT involving the left popliteal and left gastrocnemius vein. Started on Xarelto .    3) 05/02/2021: Established care with Johnston Police PA-C   Interval History:  Cameron Kim 54 y.o. male with medical history significant for recurrent DVTs who presents for a follow up visit. The patient's last visit was on 04/22/2024. In the interim since the last visit he has had no major changes in his health.  On exam today Cameron Kim reports his energy and appetite are overall stable. He is trying to stay active while working at [employer]. He is able to complete all his ADLs on his own and resting as needed. He denies nausea, vomiting or bowel habit changes. He denies easy bruising. He did have one day of hematuria with 2-3 episodes which resolved on its own. He is not sure if he had a kidney stone that passed during that time. He has no other signs of bleeding including hematochezia or melena. He denies any signs or symptoms of VTE including leg pain, leg swelling, shortness of breath or chest pain. He has no other complaints. Rest of the ROS is below.   MEDICAL HISTORY:  Past Medical History:  Diagnosis Date   Abnormal EKG    Ankle fracture, left    DVT (deep venous thrombosis) (HCC)    Dysrhythmia    extra beat or skips a beat   Idiopathic inflammatory myopathy    Peripheral vascular disease 2013   dvt-pe after  ankle fx   Polymyositis (HCC)    Pulmonary embolism (HCC) 05/2012   DVT traveled to lung   Seizure disorder (HCC) 11/07/2020   had 2 but none since not on seizure meds    SURGICAL HISTORY: Past Surgical History:  Procedure Laterality Date   CARDIAC CATHETERIZATION  2021   COLONOSCOPY     I & D EXTREMITY Right 06/07/2016   Procedure: IRRIGATION AND DEBRIDEMENT RIGHT WRIST,MEDIAN NERVE EXPLORATION NEUROLYSIS AND REPAIR;  Surgeon: Elsie Mussel, MD;  Location: [facility] OR;  Service: Orthopedics;  Laterality: Right;   medial nerve surgry right arm     MUSCLE BIOPSY Right 07/02/2022   Procedure: RIGHT THIGH MUSCLE BIOPSY;  Surgeon: Curvin Deward MOULD, MD;  Location: [facility] OR;  Service: General;  Laterality: Right;   RIGHT HEART CATH N/A 09/12/2022   Procedure: RIGHT HEART CATH;  Surgeon: Rolan Ezra RAMAN, MD;  Location: [facility] INVASIVE CV LAB;  Service: Cardiovascular;  Laterality: N/A;    SOCIAL HISTORY: Social History   Socioeconomic History   Marital status: Married    Spouse name: Not on file   Number of children: 0   Years of education: Not on file   Highest education level: 12th grade  Occupational History   Occupation: disabled  Tobacco Use   Smoking status: Never    Passive exposure: Never   Smokeless tobacco: Never  Vaping Use   Vaping status: Never Used  Substance and Sexual Activity   Alcohol use: Not Currently   Drug  use: No   Sexual activity: Yes  Other Topics Concern   Not on file  Social History Narrative   Not on file   Social Drivers of Health   Tobacco Use: Low Risk (07/27/2024)   Patient History    Smoking Tobacco Use: Never    Smokeless Tobacco Use: Never    Passive Exposure: Never  Financial Resource Strain: Medium Risk (04/22/2023)   Overall Financial Resource Strain (CARDIA)    Difficulty of Paying Living Expenses: Somewhat hard  Food Insecurity: Food Insecurity Present (04/22/2023)   Hunger Vital Sign    Worried About Running Out of Food in the Last Year:  Sometimes true    Ran Out of Food in the Last Year: Sometimes true  Transportation Needs: No Transportation Needs (04/22/2023)   PRAPARE - Administrator, Civil Service (Medical): No    Lack of Transportation (Non-Medical): No  Physical Activity: Insufficiently Active (04/22/2023)   Exercise Vital Sign    Days of Exercise per Week: 3 days    Minutes of Exercise per Session: 30 min  Stress: Stress Concern Present (04/22/2023)   [employer] of Occupational Health - Occupational Stress Questionnaire    Feeling of Stress : To some extent  Social Connections: Moderately Integrated (04/22/2023)   Social Connection and Isolation Panel    Frequency of Communication with Friends and Family: More than three times a week    Frequency of Social Gatherings with Friends and Family: Once a week    Attends Religious Services: More than 4 times per year    Active Member of [employer] or Organizations: Yes    Attends Banker Meetings: Not on file    Marital Status: Widowed  Intimate Partner Violence: Not on file  Depression (PHQ2-9): Medium Risk (04/08/2024)   Depression (PHQ2-9)    PHQ-2 Score: 7  Alcohol Screen: Not on file  Housing: Unknown (01/22/2024)   Received from [facility]   Epic    Unable to Pay for Housing in the Last Year: Not on file    Number of Times Moved in the Last Year: Not on file    At any time in the past 12 months, were you homeless or living in a shelter (including now)?: No  Utilities: Not on file  Health Literacy: Not on file    FAMILY HISTORY: Family History  Problem Relation Age of Onset   Diabetes Mother    Hypertension Mother    Heart murmur Mother    Stroke Mother    Diabetes Father    Hypertension Father    ALS Father    Stroke Sister    Rheum arthritis Brother    Diabetes Paternal Grandmother    Diabetes Paternal Grandfather    Colon cancer Neg Hx    Esophageal cancer Neg Hx    Rectal cancer Neg Hx    Stomach  cancer Neg Hx     ALLERGIES:  has no known allergies.  MEDICATIONS:  Current Outpatient Medications  Medication Sig Dispense Refill   amLODipine (NORVASC) 5 MG tablet Take 5 mg by mouth daily.     apixaban  (ELIQUIS ) 5 MG TABS tablet Take 1 tablet (5 mg total) by mouth 2 (two) times daily. 60 tablet 3   Cyanocobalamin  (VITAMIN B-12 PO) Take 1 tablet by mouth daily.     doxycycline  (VIBRA -TABS) 100 MG tablet Take 1 tablet (100 mg total) by mouth 2 (two) times daily. (Patient not taking: Reported on 07/27/2024) 20  tablet 0   FLUoxetine  (PROZAC ) 20 MG capsule Take 1 capsule (20 mg total) by mouth daily. (Patient not taking: Reported on 07/27/2024) 30 capsule 3   FOLIC ACID  PO Take 1 tablet by mouth daily.     hydroxychloroquine  (PLAQUENIL ) 200 MG tablet Take 1 tablet (200 mg total) by mouth 2 (two) times daily. 60 tablet 2   mycophenolate  (CELLCEPT ) 500 MG tablet Take by mouth.     nystatin  (MYCOSTATIN /NYSTOP ) powder Apply 1 Application topically 2 (two) times daily. (Patient not taking: Reported on 07/27/2024) 60 g 3   tadalafil  (CIALIS ) 20 MG tablet Take 0.5-1 tablets (10-20 mg total) by mouth every other day as needed for erectile dysfunction. 10 tablet 11   No current facility-administered medications for this visit.    REVIEW OF SYSTEMS:   Constitutional: ( - ) fevers, ( - )  chills , ( - ) night sweats Eyes: ( - ) blurriness of vision, ( - ) double vision, ( - ) watery eyes Ears, nose, mouth, throat, and face: ( - ) mucositis, ( - ) sore throat Respiratory: ( - ) cough, ( - ) dyspnea, ( - ) wheezes Cardiovascular: ( - ) palpitation, ( - ) chest discomfort, ( - ) lower extremity swelling Gastrointestinal:  ( - ) nausea, ( - ) heartburn, ( - ) change in bowel habits Skin: ( - ) abnormal skin rashes Lymphatics: ( - ) new lymphadenopathy, ( - ) easy bruising Neurological: ( - ) numbness, ( - ) tingling, ( - ) new weaknesses Behavioral/Psych: ( - ) mood change, ( - ) new changes  All  other systems were reviewed with the patient and are negative.  PHYSICAL EXAMINATION:  There were no vitals filed for this visit.    There were no vitals filed for this visit.     GENERAL: well appearing middle aged African American male. alert, no distress and comfortable SKIN: skin color, texture, turgor are normal, no rashes or significant lesions EYES: conjunctiva are pink and non-injected, sclera clear LUNGS: clear to auscultation and percussion with normal breathing effort HEART: regular rate & rhythm and no murmurs and no lower extremity edema Musculoskeletal: no cyanosis of digits and no clubbing  PSYCH: alert & oriented x 3, fluent speech NEURO: no focal motor/sensory deficits  LABORATORY DATA:  I have reviewed the data as listed    Latest Ref Rng & Units 07/27/2024    9:24 AM 04/22/2024   10:11 AM 04/08/2024   11:22 AM  CBC  WBC 4.0 - 10.5 K/uL 11.1  9.1  12.6   Hemoglobin 13.0 - 17.0 g/dL 87.7  86.3  87.5   Hematocrit 39.0 - 52.0 % 35.5  39.9  36.4   Platelets 150.0 - 400.0 K/uL 304.0  348  383.0        Latest Ref Rng & Units 07/27/2024    9:24 AM 04/22/2024   10:11 AM 04/08/2024   11:22 AM  CMP  Glucose 70 - 99 mg/dL 80  896  79   BUN 6 - 23 mg/dL 15  11  11    Creatinine 0.40 - 1.50 mg/dL 9.27  9.18  9.11   Sodium 135 - 145 mEq/L 137  139  136   Potassium 3.5 - 5.1 mEq/L 3.8  3.7  4.5   Chloride 96 - 112 mEq/L 101  106  99   CO2 19 - 32 mEq/L 28  28  31    Calcium 8.4 - 10.5 mg/dL 8.6  9.2  9.1   Total Protein 6.0 - 8.3 g/dL 8.8  8.1  9.2   Total Bilirubin 0.2 - 1.2 mg/dL 1.4  0.7  1.8   Alkaline Phos 39 - 117 U/L 65  75  72   AST 0 - 37 U/L 13  15  13    ALT 0 - 53 U/L 9  10  9      RADIOGRAPHIC STUDIES: No results found.  ASSESSMENT & PLAN Cameron Kim is a 54 y.o. male returns for a follow up for recurrent DVTs.    #Recurrent LE DVT: --First episode in 2013 involved PE and DVT in left lower leg following left ankle fracture and prolonged car  travel. --Second episode occurred on 04/17/2021 whcih involved DVT of left popliteal vein and the gastrocnemius veins. --There is no provoking factor that caused recent DVT on 04/17/2021 so recommend indefinite anticoagulation. --Discontinued Xarelto  around April/May 2023 due to cost.  --Hypercoagulable workup from 05/02/2021 was unremarkable except for mild elevation of anticardiolipin IgM levels. --Labs today show no cytopenias and CMP was unremarkable. No intervention is required.  --continue Eliquis  5 mg twice daily --strict precautions for bleeding. Cost of the medication is manageable at this time.  --RTC in 6 months   #Leukocytosis-Neutrophilic Predominance #Thrombocytosis-resolved --No infectious symptoms and patient is afebrile today --Sed rate was elevated and positive ANA levels when PCP checked on 05/11/2022 which is concerning for rheumatological process --Currently following with Dr. Dolphus in Rheumatology and [facility]. --had visit with Dr. Geronimo in Pulmonary, concern for interstitial disease with connective tissue disease.    #Normocytic Anemia-Resolved.  --etiologies include inflammatory process, nutritional deficiencies,  --evidence of B12 and folate deficiency confirmed with labs on 02/08/2022.  --Advised to follow up with PCP to recheck levels to determine if he needs to continue.  No orders of the defined types were placed in this encounter.   All questions were answered. The patient knows to call the clinic with any problems, questions or concerns.   I have spent a total of 25 minutes minutes of face-to-face and non-face-to-face time, preparing to see the patient, performing a medically appropriate examination, counseling and educating the patient, documenting clinical information in the electronic health record, independently interpreting results and communicating results to the patient, and care coordination.   Johnston Police PA-C Dept of Hematology and  Oncology [facility] at [facility] Phone: [phone]   10/22/2024 9:48 PM "

## 2024-10-23 ENCOUNTER — Inpatient Hospital Stay: Admitting: Physician Assistant

## 2024-10-23 ENCOUNTER — Inpatient Hospital Stay: Attending: Physician Assistant

## 2024-10-23 VITALS — BP 139/82 | HR 64 | Temp 97.9°F | Resp 15 | Wt 236.9 lb

## 2024-10-23 DIAGNOSIS — Z7901 Long term (current) use of anticoagulants: Secondary | ICD-10-CM | POA: Diagnosis not present

## 2024-10-23 DIAGNOSIS — Z86711 Personal history of pulmonary embolism: Secondary | ICD-10-CM | POA: Diagnosis present

## 2024-10-23 DIAGNOSIS — Z86718 Personal history of other venous thrombosis and embolism: Secondary | ICD-10-CM

## 2024-10-23 DIAGNOSIS — Z79899 Other long term (current) drug therapy: Secondary | ICD-10-CM | POA: Diagnosis not present

## 2024-10-23 DIAGNOSIS — D72829 Elevated white blood cell count, unspecified: Secondary | ICD-10-CM | POA: Diagnosis not present

## 2024-10-23 DIAGNOSIS — Z862 Personal history of diseases of the blood and blood-forming organs and certain disorders involving the immune mechanism: Secondary | ICD-10-CM | POA: Insufficient documentation

## 2024-10-23 DIAGNOSIS — Z8639 Personal history of other endocrine, nutritional and metabolic disease: Secondary | ICD-10-CM | POA: Diagnosis not present

## 2024-10-23 LAB — CMP (CANCER CENTER ONLY)
ALT: 15 U/L (ref 0–44)
AST: 22 U/L (ref 15–41)
Albumin: 4.3 g/dL (ref 3.5–5.0)
Alkaline Phosphatase: 80 U/L (ref 38–126)
Anion gap: 9 (ref 5–15)
BUN: 10 mg/dL (ref 6–20)
CO2: 27 mmol/L (ref 22–32)
Calcium: 9.1 mg/dL (ref 8.9–10.3)
Chloride: 104 mmol/L (ref 98–111)
Creatinine: 0.97 mg/dL (ref 0.61–1.24)
GFR, Estimated: >60 mL/min
Glucose, Bld: 96 mg/dL (ref 70–99)
Potassium: 4.4 mmol/L (ref 3.5–5.1)
Sodium: 140 mmol/L (ref 135–145)
Total Bilirubin: 0.5 mg/dL (ref 0.0–1.2)
Total Protein: 8 g/dL (ref 6.5–8.1)

## 2024-10-23 LAB — CBC WITH DIFFERENTIAL (CANCER CENTER ONLY)
Abs Immature Granulocytes: 0.02 K/uL (ref 0.00–0.07)
Basophils Absolute: 0.1 K/uL (ref 0.0–0.1)
Basophils Relative: 1 %
Eosinophils Absolute: 0.1 K/uL (ref 0.0–0.5)
Eosinophils Relative: 1 %
HCT: 47.2 % (ref 39.0–52.0)
Hemoglobin: 15.5 g/dL (ref 13.0–17.0)
Immature Granulocytes: 0 %
Lymphocytes Relative: 20 %
Lymphs Abs: 1.8 K/uL (ref 0.7–4.0)
MCH: 30.8 pg (ref 26.0–34.0)
MCHC: 32.8 g/dL (ref 30.0–36.0)
MCV: 93.7 fL (ref 80.0–100.0)
Monocytes Absolute: 1 K/uL (ref 0.1–1.0)
Monocytes Relative: 11 %
Neutro Abs: 6.1 K/uL (ref 1.7–7.7)
Neutrophils Relative %: 67 %
Platelet Count: 234 K/uL (ref 150–400)
RBC: 5.04 MIL/uL (ref 4.22–5.81)
RDW: 14.5 % (ref 11.5–15.5)
WBC Count: 9.1 K/uL (ref 4.0–10.5)
nRBC: 0 % (ref 0.0–0.2)

## 2024-10-30 NOTE — Progress Notes (Signed)
 I called and spoke to Cameron Kim. Cameron Kim informed of Dr Reeves note and verbalized understanding. Cameron Kim has been scheduled to see Dr Geronimo on 11-04-24. NFN

## 2024-11-04 ENCOUNTER — Encounter: Payer: Self-pay | Admitting: Internal Medicine

## 2024-11-04 ENCOUNTER — Ambulatory Visit (INDEPENDENT_AMBULATORY_CARE_PROVIDER_SITE_OTHER): Admitting: Internal Medicine

## 2024-11-04 VITALS — BP 124/76 | HR 71 | Ht 72.0 in | Wt 238.0 lb

## 2024-11-04 DIAGNOSIS — J8489 Other specified interstitial pulmonary diseases: Secondary | ICD-10-CM

## 2024-11-04 DIAGNOSIS — M3320 Polymyositis, organ involvement unspecified: Secondary | ICD-10-CM | POA: Diagnosis not present

## 2024-11-04 DIAGNOSIS — I2723 Pulmonary hypertension due to lung diseases and hypoxia: Secondary | ICD-10-CM | POA: Diagnosis not present

## 2024-11-04 DIAGNOSIS — M359 Systemic involvement of connective tissue, unspecified: Secondary | ICD-10-CM

## 2024-11-04 DIAGNOSIS — M3391 Dermatopolymyositis, unspecified with respiratory involvement: Secondary | ICD-10-CM

## 2024-11-04 DIAGNOSIS — D849 Immunodeficiency, unspecified: Secondary | ICD-10-CM

## 2024-11-04 NOTE — Patient Instructions (Addendum)
"    ICD-10-CM   1. Interstitial lung disease due to connective tissue disease (HCC)  J84.89    M35.9     2. Dermatomyositis affecting respiratory system (HCC)  M33.91     3. Jo-1 positive polymyositis (HCC)  M33.20     4. Immunosuppressed status  D84.9     5. WHO group 3 pulmonary arterial hypertension (HCC)  I27.23       #Interstitial lung disease Clinically stable with interstitial lung disease due to connective tissue disease  Last dose prednisone  December 2024 Last dose of Rituxan in January2026 and February 2026 pending Last dose of IVIG December 2026 Ongoing CellCept  CK blood work normal in October 2025   Plan -resepect no  RSV vaccine, COVID-vaccine and shingles vaccine and flu shot -per your personal decision - Any progression of pulmonary fibrosis then we will consider antifibrotic - Do spirometry and DLCO in 3 months around April 2026 - Keep the CT scan appointment in April 2026  #WHO group 3 pulmonary hypertension diagnosed December 2023  -Yeah you definitely have this condition since right heart cath in December 2023 it is mild but I believe it is stable based on his symptoms and blood work of October 2025 and the echocardiogram in October 2025  PLAN  - Clinical monitoring - Take consent form today 11/04/2024 for research protocol registry program called [employer]  Follow-up - 3 months to spirometry and DLCO. -3 months to high-resolution CT chest [I believe you already have the appointment] -15-minute visit Dr. Geronimo in 3 months but after spirometry and CT "

## 2024-11-04 NOTE — Progress Notes (Signed)
 "      OV 06/26/2022  Subjective:  Patient ID: Cameron Kim, male , DOB: [birth year] , age 54 y.o. , MRN: [id number] , ADDRESS: [address] [place] [address] PCP Cameron Comer BRAVO, MD Patient Care Team: Cameron Comer BRAVO, MD as PCP - General (Family Medicine) Cameron Dorn PARAS, MD as PCP - Cardiology (Cardiology)  This Provider for this visit: Treatment Team:  Attending Provider: Geronimo Amel, MD    06/26/2022 -   Chief Complaint  Patient presents with   Consult    Wheezing/ cough      HPI Cameron Kim 54 y.o. -referral from the Cameron Kim  who he saw for the first time on 05/31/2022.  He has been diagnosed with elevated CK and Jo 1 positive polymyositis.  He used to work in holiday representative.  He has had COVID booster mRNA 3 times.  He had COVID in November 2022 and sometime after that started developing symptoms of weakness of the proximal muscles.  This also included dysphagia, dysphonia and dysarthria after he saw Cameron on 05/31/2022 he was placed on prednisone  40 mg twice daily.  After this is significantly improved.  His results were elevated CK which is improving.  Jo 1 antibody positive positive ANA.  QuantiFERON gold indeterminate.  He has not been referred to [facility] where he has an upcoming appointment.  In the last few to several months he is also noticing cough with some wheezing.  He is also choking on saliva.  There is no shortness of breath per se but then he is weak and is not able to walk too much.  He is not disabled from his work.  He did have a CT scan of the abdomen 06/08/2022 in the lung images do show abnormalities.  However walking desaturation test today with this is normal.  He does not have echocardiogram.  He does not have pulmonary function test.  There is no high-resolution CT chest.    [facility] consult 9/122/23 Dr Cameron Kim      No data to display         Chief Complaint  Patient presents with   Polymyositis   Cameron Kim is a 54 y.o. male who presents for evaluation of longstanding anti-Jo-1 antibody positive inflammatory myositis. The patient is accompanied by his wife, Cameron Kim.  Beginning in 08/2021, the patient noticed decreased strength which worsened to the point he was unable to move or sit up in bed. He saw his primary care provider in 02/2022 and has seen multiple physicians since. He recently saw a rheumatologist locally who diagnosed him with polymyositis and referred him here to [facility]. Cameron Kim also indicates he has difficulty swallowing and his voice has changed. He notes a weight loss of 48 pounds in 9 months. He also mentions he had COVID-19 around the same time. The patient endorses a rash which he feels looks like ringworm with extreme itchiness and flakiness.   The patient has a history of a torn rotator cuff but was denied surgery due to a protein abnormality. Once he was provided steroids on 06/01/2022, the patient improved overall. He continues to have stiffness which impacts his lifestyle.   The patient has had numerous tests including DEXA scan, upper abdominal exam, and is scheduled for a biopsy next week. He has noticed increased moodiness and appetite with prednisone . He also indicates he has insomnia. His prednisone  taper was started at 80 mg and currently he continues on 74  mg daily.  The only joint pain Cameron Kim notes currently is hand pain with inability to fully close his hands. Prior to prednisone , he had all over joint pain.   Family History: Father diagnosed with ALS. Social History: He worked in holiday representative. Past medical history:  - Blood clots in 2013 and 2022, currently on blood thinners.    A/P  Cameron Kim is a 54 y.o. male with likely an overlap disease with anti-Jo-1 antibody, Ro antibodies, rheumatoid factor, and CCP positivity.   He has signs and symptoms classic for dermatomyositis with history of heliotropic rash, mechanics hands,  Gottron's papules, proximal muscle weakness, and inflammatory arthritis, currently on prednisone  60 mg. He is planning to get tapered with his local rheumatologist down to 50 mg in the next few weeks.   He is scheduled for a muscle biopsy on Monday, and he will plan to start CellCept  on Tuesday after he undergoes the muscle biopsy. We discussed the risks and benefits of CellCept , azathioprine, methotrexate, IVIG and for now the goal will be to start the CellCept  and titrate it up slowly until he can tolerate 1500 mg twice daily.  1. Jo-1 positive polymyositis (CMS-HCC) 2. High risk medication use 3. Seropositive RA - mycophenolate  (CELLCEPT ) 500 mg tablet; Take 1 tablet (500 mg total) by mouth 2 (two) times daily for 14 days, THEN 2 tablets (1,000 mg total) 2 (two) times daily for 14 days, THEN 3 tablets (1,500 mg total) 2 (two) times daily for 90 days. - continue prednisone  60 mg for now, follow taper as suggested by local rheumatologist   OV 08/28/2022  Subjective:  Patient ID: Cameron Kim, male , DOB: [birth year] , age 54 y.o. , MRN: [id number] , ADDRESS: [address] [place] [address] PCP Cameron Comer BRAVO, MD Patient Care Team: Cameron Comer BRAVO, MD as PCP - General (Family Medicine) Cameron Dorn PARAS, MD as PCP - Cardiology (Cardiology) Cameron Reiter, MD as Consulting Physician (Cameron)  This Provider for this visit: Treatment Team:  Attending Provider: Geronimo Amel, MD    08/28/2022 -   Chief Complaint  Patient presents with   Follow-up    Pt states he has been doing okay since last visit.     HPI Cameron Kim 54 y.o. -returns for follow-up.  Since last seeing me he has seen Dr. Anselm at [facility].  Her notes were reviewed and copied and pasted above.  She is diagnosed with combination of dermatomyositis/polymyositis with seropositive rheumatoid arthritis.  She started him on CellCept  along with Bactrim .  High-dose  prednisone  which is being tapered now by local rheumatologist Dr Kim.  He is beginning to feel better.  His muscle strength is improved.  He did have interstitial lung disease workup.  He had an echocardiogram that shows enlarged pulmonary arteries with suggestion of pulmonary hypertension.  He also had high-resolution d discuss the implications of the findings.  He recollects a left heart catheterization a few years ago at [facility] when he was incarcerated.  But it does not sound like he has had a right heart catheterization.   His current symptoms are as below   No results found for: NITRICOXIDE  Latest Reference Range & Units 05/31/22 11:02 06/19/22 16:30 07/31/22 08:40  CK Total 44 - 196 U/L 3,806 (H) 475 (H) 265 (H)     OV 11/06/2022  Subjective:  Patient ID: Cameron Kim, male , DOB: [birth year] , age 46 y.o. , MRN: [id number] , ADDRESS: [address] [place] [address] PCP Cameron Comer BRAVO, MD Patient Care Team: Cameron Comer BRAVO, MD as PCP - General (Family Medicine) Cameron Dorn PARAS, MD as PCP - Cardiology (Cardiology) Cameron Reiter, MD as Consulting Physician (Cameron)  This Provider for this visit: Treatment Team:  Attending Provider: Geronimo Amel, MD    11/06/2022 -   Chief Complaint  Patient presents with   Follow-up    PFT done today. Breathing is unchanged since the last visit.      HPI Cameron Kim 54 y.o. -he is here for follow-up.  After the last visit I emailed [facility] Dr. Anselm and she emailed thought that 2 doses of Rituxan could be potential benefit for him in stabilizing his ILD.  He did see the other local [facility] rheumatology PA on 10/18/2022.  Was noted that he was taking Plaquenil , CellCept  100 mg 3 tablets by mouth twice daily along with Bactrim  and prednisone  30 mg.  It was noted that he did not have any side effects..  He reported some arthralgia.  And also continued intermittent  rashes as well as increased frequency of Raynaud's.  However CK was elevated to 1100 [personally reviewed]  medical group Cameron preferred that [facility] Cameron take over his care.  On his visit to [facility] Dr. Anselm on 10/29/2022 he noticed that his skin was worsening and experiencing dryness.  There was some suspicion of a hand infection and he did get antibiotic doxycycline  prior to that visit.  Cameron at [facility] felt that his disease was not in remission.  She gave him handout on methotrexate.  Low-dose of amlodipine for the worsening Raynaud's was prescribed.  She also decided to start IVIG because of elevations in CK despite CellCept  and Plaquenil  and high-dose prednisone .  She personally emailed me today saying that current plan is for him to get IVIG at [facility]  1 mg/kg x2 days at ideal weight, which made the dose 80 mg, x2.  In the work she would consider IV Rituxan.    Today he tells me that he is now taking prednisone  40 mg/day since approximately 10/14/2022 since his CK was elevated.  Also for the last week or so since seeing [facility] he is on amlodipine.  So far he is not sure things have improved particularly in the foot over the Raynaud's.  He is open to getting CK checked  Of note he did have right heart catheterization by Dr. Ezra Shuck in December 2023.  He does have pulmonary artery pressures bordeline elevated but his pulmonary vascular resistance is less than 3. PCWP 12, MAP 25 and PVR < 2  From a respiratory standpoint he is stable and walking desaturation test is stable and pulmonary function test is stable records suggesting that his ILD is actually still stable.  Although given the elevated CK he is at risk of progression in the future.   OV 03/11/2023  Subjective:  Patient ID: Cameron Kim, male , DOB: Jun 06, 1971 , age 74 y.o. , MRN: [id number] , ADDRESS: [address] [place] [address] PCP Cameron Comer BRAVO,  MD Patient Care Team: Cameron Comer BRAVO, MD as PCP - General (Family Medicine) Cameron Dorn PARAS, MD as PCP - Cardiology (Cardiology) Cameron Reiter, MD as Consulting Physician (Cameron)  This Provider for this visit: Treatment Team:  Attending Provider: Geronimo Amel, MD    03/11/2023 -  No chief complaint on file.    HPI Cameron  Kim 54 y.o. -       OV 05/03/2023  Subjective:  Patient ID: Cameron Kim, male , DOB: 12/29/1970 , age 37 y.o. , MRN: [id number] , ADDRESS: [address] [place] [address] PCP Cameron Comer BRAVO, MD Patient Care Team: Cameron Comer BRAVO, MD as PCP - General (Family Medicine) Cameron Dorn PARAS, MD as PCP - Cardiology (Cardiology) Cameron Reiter, MD as Consulting Physician (Cameron)  This Provider for this visit: Treatment Team:  Attending Provider: Geronimo Amel, MD    05/03/2023 -   Chief Complaint  Patient presents with   Follow-up    F/up on PFT      HPI Jessen Stutz 54 y.o. -returns for follow-up.  He missed his appointment with me in June 2024 because of multiple other appointments.  Last seen January 2024.  Since then he is continue to CellCept  daily prednisone , Plaquenil  and Bactrim  for PCP prophylaxis.  Based on his history and review of the external medical records from [facility] he did get IVIG and also recently Rituxan [see above].  He believes his muscle strength is improved.  In fact he was able to do sit/stand test 15 times easily without any issues.  His last CK that they have either with myself or at [facility] is in March 2024 and it shows improvement but still elevated [see below].  At [facility] he did have a CRP that was still elevated.  Despite all this he is continuing to experience increasing shortness of breath in the last few months.  Of note he had 100 pound weight gain in the last 1 year.  He did show a tendency to desaturate with a sit/stand test today  although it was very transient to 87%.  And he was only mildly dyspneic.  Nevertheless he is worried about disease progression.    OV 09/24/2023  Subjective:  Patient ID: Cameron Kim, male , DOB: Dec 10, 1970 , age 23 y.o. , MRN: [id number] , ADDRESS: [address] [place] [address] PCP Cameron Comer BRAVO, MD Patient Care Team: Cameron Comer BRAVO, MD as PCP - General (Family Medicine) Cameron Dorn PARAS, MD as PCP - Cardiology (Cardiology) Cameron Reiter, MD as Consulting Physician (Cameron)  This Provider for this visit: Treatment Team:  Attending Provider: Geronimo Amel, MD  09/24/2023 -   Chief Complaint  Patient presents with   Follow-up    Ct f/u, pt states breathing has been okay      HPI Cameron Kim 54 y.o. -presents for follow-up.  At the last visit we ruled out pulmonary embolism.  Currently is feeling stable.  He continues his immunomodulatory treatment.  His strength is better he is able to do a lot more squats.  His rheumatologist planning to wean him off prednisone .  I reviewed the external medical record he continues to get IVIG infusions but it has been a while since he saw Dr. Orvilla.  His symptom score is stable.  Pulmonary function test the FVC is improved but the DLCO is stable.  He had a high-resolution CT chest.  Official report is pending because of radiology shortage.  But my personal visualization of the ILD is the same/slightly better.  His exercise hypoxemia test shows slight desaturation but it is also stable.  We did overnight pulse oximetry.  He says he has done it 2 times but I do not have the results.  Will try to get the old results.  I offered him flu  shot but he declined.  He does not want to have the flu shot and RSV shot.  He says people get sick after getting it.  I did warn him about slow COVID in the setting of Rituxan and to take antivirals if he ever gets COVID.  He had labs yesterday and is normal.  He is willing to  get CRP and total CK today.  He did 2 overnight pulse oximetry is in the summer 2024 but both were abnormal.  We did not get both results.  I have to order 1 now.      CT Chest data from date: *09/1123  - personally visualized and independently interpreted : YES, results official NA - my findings are: -Stable interstitial lung disease versus slightly better.   OV 07/27/2024  Subjective:  Patient ID: Cameron Kim, male , DOB: [birth year] , age 30 y.o. , MRN: [id number] , ADDRESS: [address] [place] [address] PCP Cameron Comer BRAVO, MD Patient Care Team: Cameron Comer BRAVO, MD as PCP - General (Family Medicine) Cameron Dorn PARAS, MD as PCP - Cardiology (Cardiology) Cameron Reiter, MD as Consulting Physician (Cameron)  This Provider for this visit: Treatment Team:  Attending Provider: Geronimo Amel, MD    07/27/2024 -   Chief Complaint  Patient presents with   Interstitial Lung Disease    Pt states breathing getting better, exercising helps out PFT F/U SOB w/ exertion Occasionally has Dry and Prod cough ( phlegm clear)      HPI Cameron Kim 54 y.o. -returns for follow-up.  Is actually been almost a year since I last saw him.  Last saw him 2 at the end of 2024.  He himself is surprised that it has been that long.  He says he has lost weight.  This maintained his health.  He is now down to 237 pounds he was 270 pounds.  He is exercising and following a good diet.  No hospitalizations no emergency room visits no surgeries but he did fall recently and bruised his left hip and is iced it and is getting some better.  He continues his immunosuppression with CellCept , Rituxan, Plaquenil , Bactrim .  He is followed with Dr. Anselm at [facility] and I did review her external medical records from 06/05/2024    His symptoms, pulmonary function test and exercise hypoxemia test are all stable. Most recent doses of his immunosuppressive medications are  updated above. He rejected flu and other resp vaccines   OV 11/04/2024  Subjective:  Patient ID: Cameron Kim, male , DOB: 03-Jun-1971 , age 40 y.o. , MRN: [id number] , ADDRESS: [address] [place] [address] PCP Cameron Comer BRAVO, MD Patient Care Team: Cameron Comer BRAVO, MD as PCP - General (Family Medicine) Cameron Dorn PARAS, MD as PCP - Cardiology (Cardiology) Cameron Reiter, MD as Consulting Physician (Cameron)  This Provider for this visit: Treatment Team:  Attending Provider: Geronimo Amel, MD    polymyositis and probable UIP/ILD on CT scan last CT scan December 2024   treatment plan of Dr. Anselm at [facility]  -CellCept   -Daily prednisone  40 mg/day: Off this since December 2024.  - bactrim ,  - plaqueni  -Status post cycle 1 Rituxan 05/02/2023 and every 6 months after that -> most recent per him Aug 2025   -gets it in [place] outside [employer]  -IVIG per history given end of June 2024 and then second cycle early May 10, 2023 -> last dose  Oct 2025   - get it in [place] outside [employer]   Echo in fall 2023 with elevation of pulmonary artery pressures.    Recurrent DVTs (Hypercoagulable workup from 05/02/2021 was unremarkable except for mild elevation of anticardiolipin IgM levels. ) 1) 05/2012: Diagnosed with pulmonary embolism and left lower leg DVT. Treated with coumadin. Provoking factor felt to be left ankle fracture and prolonged immobility with car travel. (Records not available for review).   2)04/17/2021: Doppler US  confirmed acute DVT involving the left popliteal and left gastrocnemius vein. Started on Xarelto .    3) 05/02/2021: Established care with [employer] [facility]  4) No PE Aug 2024  5 ) on full dose eliquis  as of Jan 2026   #Mild group 3 pulmonary hypertension diagnosed December 2023  - RA pressure 9, mean pulm artery pressure 25, wedge 12, PVR 1.9 and cardiac index 2.85  11/04/2024 -   Chief Complaint  Patient  presents with   Medical Management of Chronic Issues   Interstitial Lung Disease    Breathing has been stable. Has only has occ cough- non prod. He is here to go over ECHO results.      HPI Cameron Kim 54 y.o. 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  He feels relatively stable since his last visit in October, with no new health issues, hospitalizations, emergency room visits, or surgeries. He continues to experience wheezing and a mild cough in the mornings, which improves once his lungs 'clear out'. No runny nose or sinus drainage.  But the symptoms are mild.  He has not received a flu shot and has never smoked.  He is currently on Cellcept , receiving IVIG infusions once a month, and Rituxan every six months. His last Rituxan infusion was on October 30, 2024, with the next scheduled for February. His last IVIG infusion was on December 11 and 12, 2025, at [facility].  He had an echo in October 2025 that showed elevation from the artery pressure.  Therefore I called him in for this visit.  However he is stable.  There is no worsening.  His BNP in October 2024 is normal.  Review of his right heart cath from December 2023 shows mild pulmonary venous hypertension.  Therefore we opted to just follow this and still getting another right heart cath.    He recently had blood work done at a cancer center on October 23, 2024, and similar tests in October 2025, which were normal.   He has upcoming high-res CT in April 2026.  He missed his appointment Dr. Apterheumatologist at [facility]   SYMPTOM SCALE - ILD 08/28/2022 11/06/2022  05/03/2023  09/24/2023  07/27/2024  11/04/2024   Current weight December 2023 got diagnosed with WHO group 3 pulmonary hypertension with low PVR0 Cellcept  , pred, plaquenil , bactrim  CellCept , prednisone ,  Plaquenil , Bactrim  IVIG June 2024 and Rituxan July 2024  130 kg CellCept , prednisone , Plaquenil , Bactrim , IVIG and Rituxan  He will come off prednisone  as of September 29, 2023 room air CellCept , Plaquenil , Bactrim , IVIG and Rituxan  Has lost weight Rituxan, CellCept , Plaquenil , IVIG  No antifibrotic No prednisone  No oxygen  O2 use ra0  ra  Room air ra  Shortness of Breath 0 ->1 5 scale with 5 bein1g worst (score 6 If unaxble to do)       At rest 02 0 0 0  0 0  Simple tasks - showers, clothes change, eating, shaving 2 0 1 1 0 0  Household (dishes, doing bed, laundry) 2 2 3  1 1   Shopping 2 0 2  1 1   Walking level at own pace 2 1 3   0 x  Walking up Stairs 3 1 4 3 2 2   Total (30-36) Dyspnea Score 11 4 13  4 4   How bad is your cough? 0 0 0 1 1 1   How bad is your fatigue 3 1 1 2 1 1   How bad is nausea 2 0 0 0 0 0  How bad is vomiting?  0 0 0 0 0 0  How bad is diarrhea? 3 0 0 0 0 0  How bad is anxiety? 2 1 3  0 0 1  How bad is depression 0 0 0 0 0 0  Any chronic pain - if so where and how bad x   00 0 0           SIT STAND TEST - goal 15 times   07/27/2024  11/04/2024   O2 used ra ra  PRobe - finter or forehead finger finger  Number sit and stand completed - goal 15 33 sec 29 sec  Time taken to complete 15 ties 15 times  Resting Pulse Ox/HR/Dyspnea  98% and 76/min and dyspnea of 0/10  97% and HR 71  Peak measures 97 % and 96/min and dyspnea of 2/10   Final Pulse Ox/HR 96% and 85/min and dyspnea of 0.5/10 90% and HR 80  Desaturated </= 88% no   Desaturated <= 3% points no   Got Tachycardic >/= 90/min yes   Miscellaneous comments fine Dropped 7 points     ECHO OCT 2025   IMPRESSIONS     1. Left ventricular ejection fraction, by estimation, is 60 to 65%. Left  ventricular ejection fraction by 3D volume is 58 %. The left ventricle has  normal function. The left ventricle has no regional wall motion  abnormalities. There is mild left  ventricular hypertrophy. Left  ventricular diastolic parameters are  consistent with Grade I diastolic dysfunction (impaired relaxation). The  average left ventricular global longitudinal strain is -17.0 %. The global  longitudinal strain is normal.   2. Right ventricular systolic function is normal. The right ventricular  size is normal. There is mildly elevated pulmonary artery systolic  pressure. The estimated right ventricular systolic pressure is 37.1 mmHg.   3. The mitral valve is normal in structure. Trivial mitral valve  regurgitation. No evidence of mitral stenosis.   4. The aortic valve is normal in structure. Aortic valve regurgitation is  not visualized. No aortic stenosis is present.   5. The inferior vena cava is normal in size with greater than 50%  respiratory variability, suggesting right atrial pressure of 3 mmHg.   Comparison(s): EF 60%, GLS -28.8%, RVSP 47.3 mmHg.  PFT     Latest Ref Rng & Units 07/27/2024    8:09 AM 08/16/2023   10:00 AM 03/01/2023   10:59 AM 11/06/2022   12:07 PM 08/10/2022    8:52 AM  PFT Results  FVC-Pre L 3.52  3.29  2.89  3.03  2.89   FVC-Predicted Pre % 66  61  54  56  53   FVC-Post L     2.95   FVC-Predicted Post %     54   Pre FEV1/FVC % % 81  80  81  83  84   Post FEV1/FCV % %     84   FEV1-Pre L 2.86  2.62  2.35  2.51  2.43   FEV1-Predicted Pre % 69  63  56  60  58   FEV1-Post L     2.49   DLCO uncorrected ml/min/mmHg 14.64  14.41  15.97  15.74  14.99   DLCO UNC% % 47  46  51  51  48   DLCO corrected ml/min/mmHg  14.45  15.93  15.49  14.87   DLCO COR %Predicted %  46  51  50  47   DLVA Predicted % 70  74  88  79  86   TLC L     4.20   TLC % Predicted %     56   RV % Predicted %     64        LAB RESULTS last 96 hours No results found.       has a past medical history of Abnormal EKG, Ankle fracture, left, DVT (deep venous thrombosis) (HCC), Dysrhythmia, Idiopathic inflammatory myopathy, Peripheral vascular disease (2013), Polymyositis (HCC),  Pulmonary embolism (HCC) (05/2012), and Seizure disorder (HCC) (11/07/2020).   reports that he has never smoked. He has never been exposed to tobacco smoke. He has never used smokeless tobacco.  Past Surgical History:  Procedure Laterality Date   CARDIAC CATHETERIZATION  2021   COLONOSCOPY     I & D EXTREMITY Right 06/07/2016   Procedure: IRRIGATION AND DEBRIDEMENT RIGHT WRIST,MEDIAN NERVE EXPLORATION NEUROLYSIS AND REPAIR;  Surgeon: Elsie Mussel, MD;  Location: [facility] OR;  Service: Orthopedics;  Laterality: Right;   medial nerve surgry right arm     MUSCLE BIOPSY Right 07/02/2022   Procedure: RIGHT THIGH MUSCLE BIOPSY;  Surgeon: Curvin Deward MOULD, MD;  Location: [facility] OR;  Service: General;  Laterality: Right;   RIGHT HEART CATH N/A 09/12/2022   Procedure: RIGHT HEART CATH;  Surgeon: Rolan Ezra RAMAN, MD;  Location: [facility] INVASIVE CV LAB;  Service: Cardiovascular;  Laterality: N/A;    Allergies[1]  Immunization History  Administered Date(s) Administered   Influenza,inj,Quad PF,6+ Mos 09/01/2018   Moderna SARS-COV2 Booster Vaccination 11/05/2020   PPD Test 07/18/2022   Tdap 10/08/2004, 12/03/2014    Family History  Problem Relation Age of Onset   Diabetes Mother    Hypertension Mother    Heart murmur Mother    Stroke Mother    Diabetes Father    Hypertension Father    ALS Father    Stroke Sister    Rheum arthritis Brother    Diabetes Paternal Grandmother    Diabetes Paternal Grandfather    Colon cancer Neg Hx    Esophageal cancer Neg Hx    Rectal cancer Neg Hx    Stomach cancer Neg Hx     Current Medications[2]      Objective:   Vitals:   11/04/24 1326  BP: 124/76  Pulse: 71  SpO2: 97%  Weight: 238 lb (108 kg)  Height: 6' (1.829 m)    Estimated body mass index is 32.28 kg/m as calculated from the following:   Height as of this encounter: 6' (1.829 m).   Weight as of this encounter: 238 lb (108 kg).  @WEIGHTCHANGE @  [employer]   11/04/24 1326  Weight: 238  lb (108 kg)     Physical Exam   General: No distress.  O2 at rest: loo well Cane present: no Sitting in wheel chair: no Frail: no  Obese: no Neuro: Alert and Oriented x 3. GCS 15. Speech normal Psych: Pleasant Resp:  Barrel Chest - no.  Wheeze - no, Crackles - no, No overt respiratory distress CVS: Normal heart sounds. Murmurs - no Ext: Stigmata of Connective Tissue Disease - no HEENT: Normal upper airway. PEERL +. No post nasal drip        Assessment/     Assessment & Plan Interstitial lung disease due to connective tissue disease (HCC)  Dermatomyositis affecting respiratory system (HCC)  Jo-1 positive polymyositis (HCC)  Immunosuppressed status  WHO group 3 pulmonary arterial hypertension (HCC)    PLAN Patient Instructions     ICD-10-CM   1. Interstitial lung disease due to connective tissue disease (HCC)  J84.89    M35.9     2. Dermatomyositis affecting respiratory system (HCC)  M33.91     3. Jo-1 positive polymyositis (HCC)  M33.20     4. Immunosuppressed status  D84.9     5. WHO group 3 pulmonary arterial hypertension (HCC)  I27.23       #Interstitial lung disease Clinically stable with interstitial lung disease due to connective tissue disease  Last dose prednisone  December 2024 Last dose of Rituxan in January2026 and February 2026 pending Last dose of IVIG December 2026 Ongoing CellCept  CK blood work normal in October 2025   Plan -resepect no  RSV vaccine, COVID-vaccine and shingles vaccine and flu shot -per your personal decision - Any progression of pulmonary fibrosis then we will consider antifibrotic - Do spirometry and DLCO in 3 months around April 2026 - Keep the CT scan appointment in April 2026  #WHO group 3 pulmonary hypertension diagnosed December 2023  -Yeah you definitely have this condition since right heart cath in December 2023 it is mild but I believe it is stable based on his symptoms and blood work of October 2025 and  the echocardiogram in October 2025  PLAN  - Clinical monitoring - Take consent form today 11/04/2024 for research protocol registry program called [employer]  Follow-up - 3 months to spirometry and DLCO. -3 months to high-resolution CT chest [I believe you already have the appointment] -15-minute visit Dr. Geronimo in 3 months but after spirometry and CT    FOLLOWUP    Return in about 3 months (around 02/02/2025) for 15 min visit, after Spiro and DLCO, after HRCT chest, Face to Face Visit, with Dr Cameron.   ( Level 05 visit E&M 2024: Estb >= 40 min   in  visit type: on-site physical face to visit  in total care time and counseling or/and coordination of care by this undersigned MD - Dr Dorethia Cameron. This includes one or more of the following on this same day 11/04/2024: pre-charting, chart review, note writing, documentation discussion of test results, diagnostic or treatment recommendations, prognosis, risks and benefits of management options, instructions, education, compliance or risk-factor reduction. It excludes time spent by the CMA or office staff in the care of the patient. Actual time 40 min)    SIGNATURE    Dr. Dorethia Cameron, M.D., F.C.C.P,  Pulmonary and Critical Care Medicine Staff Physician, [facility] Center Director - Interstitial Lung Disease  Program  Pulmonary Fibrosis [facility] at [facility] [place], [place], [address]  Pager: [phone], If no answer or between  15:00h - 7:00h: call [phone] Telephone: [phone]  2:24 PM 11/04/2024    [1] No Known Allergies [2]  Current Outpatient Medications:  amLODipine (NORVASC) 5 MG tablet, Take 5 mg by mouth daily., Disp: , Rfl:    apixaban  (ELIQUIS ) 5 MG TABS tablet, Take 1 tablet (5 mg total) by mouth 2 (two) times daily., Disp: 60 tablet, Rfl: 3   Cyanocobalamin  (VITAMIN B-12 PO), Take 1 tablet by mouth daily., Disp: , Rfl:    FOLIC ACID  PO, Take 1 tablet by  mouth daily., Disp: , Rfl:    hydroxychloroquine  (PLAQUENIL ) 200 MG tablet, Take 1 tablet (200 mg total) by mouth 2 (two) times daily., Disp: 60 tablet, Rfl: 2   mycophenolate  (CELLCEPT ) 500 MG tablet, Take by mouth., Disp: , Rfl:    tadalafil  (CIALIS ) 20 MG tablet, Take 0.5-1 tablets (10-20 mg total) by mouth every other day as needed for erectile dysfunction., Disp: 10 tablet, Rfl: 11   doxycycline  (VIBRA -TABS) 100 MG tablet, Take 1 tablet (100 mg total) by mouth 2 (two) times daily. (Patient not taking: Reported on 11/04/2024), Disp: 20 tablet, Rfl: 0   FLUoxetine  (PROZAC ) 20 MG capsule, Take 1 capsule (20 mg total) by mouth daily. (Patient not taking: Reported on 11/04/2024), Disp: 30 capsule, Rfl: 3   nystatin  (MYCOSTATIN /NYSTOP ) powder, Apply 1 Application topically 2 (two) times daily. (Patient not taking: Reported on 11/04/2024), Disp: 60 g, Rfl: 3  "

## 2024-11-10 ENCOUNTER — Other Ambulatory Visit: Payer: Self-pay

## 2024-11-10 MED ORDER — TADALAFIL 20 MG PO TABS: 10.0000 mg | ORAL_TABLET | ORAL | 11 refills | Status: AC | PRN

## 2024-11-11 ENCOUNTER — Other Ambulatory Visit (HOSPITAL_COMMUNITY): Payer: Self-pay

## 2024-11-11 ENCOUNTER — Telehealth: Payer: Self-pay

## 2024-11-11 NOTE — Progress Notes (Signed)
 Received a medication Prior Auth request via fax on 11/10/2024  From: [facility] on [address]   Medication: Tadalafil  20mg  tablets  Sig: Take 1/2 to 1 tablet PO every other day as needed for erectile dysfunction  Qty: 10  Dr Comer Greet

## 2024-11-11 NOTE — Telephone Encounter (Signed)
 Pharmacy Patient Advocate Encounter   Received notification from Pt Calls Messages that prior authorization for Tadalafil  20MG  tablets is required/requested.   Insurance verification completed.   The patient is insured through [employer].   Per test claim: PA required; PA submitted to above mentioned insurance via Latent Key/confirmation #/EOC [id number] Status is pending

## 2024-11-12 NOTE — Telephone Encounter (Signed)
 Pharmacy Patient Advocate Encounter  Received notification from [employer] that Prior Authorization for Tadalafil  20MG  tablets  has been APPROVED from 10/12/24 to 11/11/26   PA #/Case ID/Reference #: [id number]

## 2024-11-12 NOTE — Telephone Encounter (Signed)
 Called and informed patient and he was appreciative.

## 2025-01-07 ENCOUNTER — Other Ambulatory Visit

## 2025-02-11 ENCOUNTER — Encounter

## 2025-02-11 ENCOUNTER — Ambulatory Visit: Admitting: Internal Medicine

## 2025-04-23 ENCOUNTER — Inpatient Hospital Stay: Admitting: Physician Assistant

## 2025-04-23 ENCOUNTER — Inpatient Hospital Stay
